# Patient Record
Sex: Male | Born: 1935 | Race: White | Hispanic: No | Marital: Married | Smoking: Former smoker
Health system: Southern US, Community
[De-identification: ages and names within clinical notes are randomized; demographics above are authoritative.]

## PROBLEM LIST (undated history)

## (undated) DIAGNOSIS — I5032 Chronic diastolic (congestive) heart failure: Secondary | ICD-10-CM

## (undated) DIAGNOSIS — E7800 Pure hypercholesterolemia, unspecified: Secondary | ICD-10-CM

## (undated) DIAGNOSIS — I509 Heart failure, unspecified: Secondary | ICD-10-CM

## (undated) DIAGNOSIS — I499 Cardiac arrhythmia, unspecified: Secondary | ICD-10-CM

## (undated) DIAGNOSIS — J449 Chronic obstructive pulmonary disease, unspecified: Secondary | ICD-10-CM

## (undated) DIAGNOSIS — N183 Chronic kidney disease, stage 3 unspecified: Secondary | ICD-10-CM

## (undated) DIAGNOSIS — I714 Abdominal aortic aneurysm, without rupture, unspecified: Secondary | ICD-10-CM

## (undated) DIAGNOSIS — N189 Chronic kidney disease, unspecified: Secondary | ICD-10-CM

## (undated) DIAGNOSIS — M2550 Pain in unspecified joint: Secondary | ICD-10-CM

## (undated) DIAGNOSIS — I493 Ventricular premature depolarization: Secondary | ICD-10-CM

## (undated) DIAGNOSIS — G4730 Sleep apnea, unspecified: Secondary | ICD-10-CM

## (undated) DIAGNOSIS — I700 Atherosclerosis of aorta: Secondary | ICD-10-CM

## (undated) DIAGNOSIS — I10 Essential (primary) hypertension: Secondary | ICD-10-CM

## (undated) DIAGNOSIS — B029 Zoster without complications: Secondary | ICD-10-CM

## (undated) DIAGNOSIS — I4891 Unspecified atrial fibrillation: Secondary | ICD-10-CM

## (undated) DIAGNOSIS — I2510 Atherosclerotic heart disease of native coronary artery without angina pectoris: Secondary | ICD-10-CM

## (undated) HISTORY — DX: Abdominal aortic aneurysm, without rupture: I71.4

## (undated) HISTORY — PX: URETHROTOMY: SHX1083

## (undated) HISTORY — DX: Ventricular premature depolarization: I49.3

## (undated) HISTORY — DX: Chronic kidney disease, stage 3 (moderate): N18.3

## (undated) HISTORY — DX: Sleep apnea, unspecified: G47.30

## (undated) HISTORY — PX: TONSILLECTOMY: SUR1361

## (undated) HISTORY — DX: Chronic kidney disease, stage 3 unspecified: N18.30

## (undated) HISTORY — DX: Cardiac arrhythmia, unspecified: I49.9

## (undated) HISTORY — DX: Abdominal aortic aneurysm, without rupture, unspecified: I71.40

## (undated) HISTORY — DX: Pure hypercholesterolemia, unspecified: E78.00

## (undated) HISTORY — DX: Unspecified atrial fibrillation: I48.91

## (undated) HISTORY — DX: Heart failure, unspecified: I50.9

## (undated) HISTORY — DX: Atherosclerotic heart disease of native coronary artery without angina pectoris: I25.10

## (undated) HISTORY — DX: Chronic obstructive pulmonary disease, unspecified: J44.9

## (undated) HISTORY — DX: Essential (primary) hypertension: I10

## (undated) HISTORY — DX: Zoster without complications: B02.9

## (undated) HISTORY — DX: Chronic diastolic (congestive) heart failure: I50.32

## (undated) HISTORY — DX: Pain in unspecified joint: M25.50

## (undated) HISTORY — DX: Chronic kidney disease, unspecified: N18.9

---

## 1998-06-27 ENCOUNTER — Ambulatory Visit (HOSPITAL_COMMUNITY): Admission: RE | Admit: 1998-06-27 | Discharge: 1998-06-27 | Payer: Self-pay | Admitting: Gastroenterology

## 2002-12-04 ENCOUNTER — Ambulatory Visit (HOSPITAL_COMMUNITY): Admission: RE | Admit: 2002-12-04 | Discharge: 2002-12-04 | Payer: Self-pay | Admitting: Gastroenterology

## 2004-05-04 ENCOUNTER — Encounter: Admission: RE | Admit: 2004-05-04 | Discharge: 2004-05-04 | Payer: Self-pay | Admitting: Geriatric Medicine

## 2005-05-15 ENCOUNTER — Ambulatory Visit (HOSPITAL_COMMUNITY): Admission: RE | Admit: 2005-05-15 | Discharge: 2005-05-15 | Payer: Self-pay | Admitting: Urology

## 2005-05-15 ENCOUNTER — Ambulatory Visit (HOSPITAL_BASED_OUTPATIENT_CLINIC_OR_DEPARTMENT_OTHER): Admission: RE | Admit: 2005-05-15 | Discharge: 2005-05-15 | Payer: Self-pay | Admitting: Urology

## 2005-09-30 HISTORY — PX: PENILE PROSTHESIS IMPLANT: SHX240

## 2005-10-01 ENCOUNTER — Ambulatory Visit (HOSPITAL_COMMUNITY): Admission: RE | Admit: 2005-10-01 | Discharge: 2005-10-01 | Payer: Self-pay | Admitting: Urology

## 2005-10-05 ENCOUNTER — Inpatient Hospital Stay (HOSPITAL_COMMUNITY): Admission: EM | Admit: 2005-10-05 | Discharge: 2005-10-07 | Payer: Self-pay | Admitting: Urology

## 2005-10-12 ENCOUNTER — Encounter: Admission: RE | Admit: 2005-10-12 | Discharge: 2005-10-12 | Payer: Self-pay | Admitting: Geriatric Medicine

## 2006-09-05 ENCOUNTER — Encounter: Admission: RE | Admit: 2006-09-05 | Discharge: 2006-09-05 | Payer: Self-pay | Admitting: Geriatric Medicine

## 2006-09-23 ENCOUNTER — Encounter: Admission: RE | Admit: 2006-09-23 | Discharge: 2006-09-23 | Payer: Self-pay | Admitting: Vascular Surgery

## 2007-03-31 ENCOUNTER — Ambulatory Visit: Payer: Self-pay | Admitting: Vascular Surgery

## 2007-05-29 ENCOUNTER — Encounter: Admission: RE | Admit: 2007-05-29 | Discharge: 2007-05-29 | Payer: Self-pay | Admitting: Geriatric Medicine

## 2007-06-03 ENCOUNTER — Encounter: Admission: RE | Admit: 2007-06-03 | Discharge: 2007-06-03 | Payer: Self-pay | Admitting: Geriatric Medicine

## 2008-03-19 ENCOUNTER — Ambulatory Visit: Payer: Self-pay | Admitting: Vascular Surgery

## 2008-08-13 ENCOUNTER — Emergency Department (HOSPITAL_COMMUNITY): Admission: EM | Admit: 2008-08-13 | Discharge: 2008-08-13 | Payer: Self-pay | Admitting: Emergency Medicine

## 2009-03-25 ENCOUNTER — Ambulatory Visit: Payer: Self-pay | Admitting: Vascular Surgery

## 2009-06-02 ENCOUNTER — Emergency Department (HOSPITAL_COMMUNITY): Admission: EM | Admit: 2009-06-02 | Discharge: 2009-06-02 | Payer: Self-pay | Admitting: Emergency Medicine

## 2009-09-15 ENCOUNTER — Ambulatory Visit: Payer: Self-pay | Admitting: Vascular Surgery

## 2009-10-06 ENCOUNTER — Inpatient Hospital Stay (HOSPITAL_COMMUNITY): Admission: EM | Admit: 2009-10-06 | Discharge: 2009-10-10 | Payer: Self-pay | Admitting: Emergency Medicine

## 2009-10-07 ENCOUNTER — Encounter (INDEPENDENT_AMBULATORY_CARE_PROVIDER_SITE_OTHER): Payer: Self-pay | Admitting: Internal Medicine

## 2009-11-02 ENCOUNTER — Ambulatory Visit (HOSPITAL_COMMUNITY): Admission: RE | Admit: 2009-11-02 | Discharge: 2009-11-02 | Payer: Self-pay | Admitting: Geriatric Medicine

## 2010-03-03 ENCOUNTER — Ambulatory Visit: Payer: Self-pay | Admitting: Vascular Surgery

## 2010-11-16 ENCOUNTER — Ambulatory Visit: Payer: Self-pay | Admitting: Vascular Surgery

## 2011-04-05 LAB — BASIC METABOLIC PANEL
BUN: 17 mg/dL (ref 6–23)
BUN: 42 mg/dL — ABNORMAL HIGH (ref 6–23)
CO2: 24 mEq/L (ref 19–32)
CO2: 25 mEq/L (ref 19–32)
Chloride: 100 mEq/L (ref 96–112)
Chloride: 103 mEq/L (ref 96–112)
Chloride: 105 mEq/L (ref 96–112)
Chloride: 108 mEq/L (ref 96–112)
Creatinine, Ser: 1.23 mg/dL (ref 0.4–1.5)
GFR calc Af Amer: >60 mL/min (ref >60)
GFR calc non Af Amer: >60 mL/min (ref >60)
Glucose, Bld: 132 mg/dL — ABNORMAL HIGH (ref 70–99)
Glucose, Bld: 142 mg/dL — ABNORMAL HIGH (ref 70–99)
Glucose, Bld: 155 mg/dL — ABNORMAL HIGH (ref 70–99)
Potassium: 3.8 mEq/L (ref 3.5–5.1)
Potassium: 4.1 mEq/L (ref 3.5–5.1)
Sodium: 138 mEq/L (ref 135–145)
Sodium: 140 mEq/L (ref 135–145)
Sodium: 141 mEq/L (ref 135–145)

## 2011-04-05 LAB — CBC
HCT: 36.5 % — ABNORMAL LOW (ref 39.0–52.0)
HCT: 39.9 % (ref 39.0–52.0)
Hemoglobin: 12.4 g/dL — ABNORMAL LOW (ref 13.0–17.0)
Hemoglobin: 13.6 g/dL (ref 13.0–17.0)
MCHC: 33.5 g/dL (ref 30.0–36.0)
MCHC: 33.9 g/dL (ref 30.0–36.0)
MCHC: 34 g/dL (ref 30.0–36.0)
MCV: 95.5 fL (ref 78.0–100.0)
MCV: 95.9 fL (ref 78.0–100.0)
MCV: 96.7 fL (ref 78.0–100.0)
Platelets: 155 10*3/uL (ref 150–400)
Platelets: 231 10*3/uL (ref 150–400)
RBC: 3.78 MIL/uL — ABNORMAL LOW (ref 4.22–5.81)
RDW: 13.1 % (ref 11.5–15.5)
RDW: 14.1 % (ref 11.5–15.5)
WBC: 11 10*3/uL — ABNORMAL HIGH (ref 4.0–10.5)
WBC: 9.1 10*3/uL (ref 4.0–10.5)

## 2011-04-05 LAB — URINE CULTURE: Colony Count: >=100000

## 2011-04-05 LAB — CARDIAC PANEL(CRET KIN+CKTOT+MB+TROPI)
CK, MB: 1.9 ng/mL (ref 0.3–4.0)
CK, MB: 2.1 ng/mL (ref 0.3–4.0)
Relative Index: INVALID (ref 0.0–2.5)
Relative Index: INVALID (ref 0.0–2.5)
Total CK: 48 U/L (ref 7–232)
Troponin I: 0.01 ng/mL (ref 0.00–0.06)
Troponin I: 0.01 ng/mL (ref 0.00–0.06)
Troponin I: 0.02 ng/mL (ref 0.00–0.06)

## 2011-04-05 LAB — URINALYSIS, ROUTINE W REFLEX MICROSCOPIC
Bilirubin Urine: NEGATIVE
Glucose, UA: NEGATIVE mg/dL
Nitrite: NEGATIVE
Protein, ur: 100 mg/dL — AB
Specific Gravity, Urine: 1.014 (ref 1.005–1.030)
Urobilinogen, UA: 0.2 mg/dL (ref 0.0–1.0)
pH: 5.5 (ref 5.0–8.0)

## 2011-04-05 LAB — DIFFERENTIAL
Basophils Absolute: 0 10*3/uL (ref 0.0–0.1)
Basophils Relative: 0 % (ref 0–1)
Eosinophils Absolute: 0 10*3/uL (ref 0.0–0.7)
Eosinophils Relative: 0 % (ref 0–5)
Lymphocytes Relative: 13 % (ref 12–46)
Lymphs Abs: 1.5 10*3/uL (ref 0.7–4.0)
Monocytes Absolute: 0.9 10*3/uL (ref 0.1–1.0)
Monocytes Relative: 8 % (ref 3–12)
Neutro Abs: 8.6 10*3/uL — ABNORMAL HIGH (ref 1.7–7.7)
Neutrophils Relative %: 78 % — ABNORMAL HIGH (ref 43–77)

## 2011-04-05 LAB — HEMOGLOBIN A1C
Hgb A1c MFr Bld: 6.7 % — ABNORMAL HIGH (ref 4.6–6.1)
Mean Plasma Glucose: 146 mg/dL

## 2011-04-05 LAB — LIPID PANEL
HDL: 35 mg/dL — ABNORMAL LOW (ref >39)
Total CHOL/HDL Ratio: 2.9 RATIO
Triglycerides: 114 mg/dL (ref <150)

## 2011-04-05 LAB — COMPREHENSIVE METABOLIC PANEL
ALT: 19 U/L (ref 0–53)
AST: 22 U/L (ref 0–37)
Albumin: 3.2 g/dL — ABNORMAL LOW (ref 3.5–5.2)
Alkaline Phosphatase: 68 U/L (ref 39–117)
BUN: 13 mg/dL (ref 6–23)
CO2: 18 mEq/L — ABNORMAL LOW (ref 19–32)
Calcium: 8.8 mg/dL (ref 8.4–10.5)
Chloride: 108 mEq/L (ref 96–112)
Creatinine, Ser: 0.97 mg/dL (ref 0.4–1.5)
GFR calc Af Amer: >60 mL/min (ref >60)
GFR calc non Af Amer: >60 mL/min (ref >60)
Glucose, Bld: 230 mg/dL — ABNORMAL HIGH (ref 70–99)
Potassium: 4 mEq/L (ref 3.5–5.1)
Sodium: 137 mEq/L (ref 135–145)
Total Bilirubin: 1.1 mg/dL (ref 0.3–1.2)
Total Protein: 6.8 g/dL (ref 6.0–8.3)

## 2011-04-05 LAB — GLUCOSE, CAPILLARY
Glucose-Capillary: 117 mg/dL — ABNORMAL HIGH (ref 70–99)
Glucose-Capillary: 125 mg/dL — ABNORMAL HIGH (ref 70–99)
Glucose-Capillary: 127 mg/dL — ABNORMAL HIGH (ref 70–99)
Glucose-Capillary: 137 mg/dL — ABNORMAL HIGH (ref 70–99)
Glucose-Capillary: 141 mg/dL — ABNORMAL HIGH (ref 70–99)
Glucose-Capillary: 150 mg/dL — ABNORMAL HIGH (ref 70–99)
Glucose-Capillary: 283 mg/dL — ABNORMAL HIGH (ref 70–99)
Glucose-Capillary: 289 mg/dL — ABNORMAL HIGH (ref 70–99)
Glucose-Capillary: 310 mg/dL — ABNORMAL HIGH (ref 70–99)
Glucose-Capillary: 96 mg/dL (ref 70–99)

## 2011-04-05 LAB — POCT CARDIAC MARKERS
CKMB, poc: 1.3 ng/mL (ref 1.0–8.0)
Myoglobin, poc: 107 ng/mL (ref 12–200)
Troponin i, poc: <0.05 ng/mL (ref 0.00–0.09)

## 2011-04-05 LAB — D-DIMER, QUANTITATIVE: D-Dimer, Quant: 2.91 ug/mL-FEU — ABNORMAL HIGH (ref 0.00–0.48)

## 2011-04-05 LAB — URINE MICROSCOPIC-ADD ON

## 2011-04-05 LAB — BRAIN NATRIURETIC PEPTIDE
Pro B Natriuretic peptide (BNP): 31.5 pg/mL (ref 0.0–100.0)
Pro B Natriuretic peptide (BNP): 991 pg/mL — ABNORMAL HIGH (ref 0.0–100.0)

## 2011-04-05 LAB — TSH: TSH: 1.286 u[IU]/mL (ref 0.350–4.500)

## 2011-04-09 LAB — CBC
HCT: 41.2 % (ref 39.0–52.0)
MCV: 98.3 fL (ref 78.0–100.0)
Platelets: 211 10*3/uL (ref 150–400)
RDW: 13.5 % (ref 11.5–15.5)

## 2011-04-09 LAB — POCT I-STAT, CHEM 8
BUN: 21 mg/dL (ref 6–23)
Calcium, Ion: 1.25 mmol/L (ref 1.12–1.32)
Creatinine, Ser: 1.1 mg/dL (ref 0.4–1.5)
TCO2: 17 mmol/L (ref 0–100)

## 2011-04-09 LAB — DIFFERENTIAL
Basophils Absolute: 0 10*3/uL (ref 0.0–0.1)
Basophils Relative: 1 % (ref 0–1)
Eosinophils Absolute: 0.1 10*3/uL (ref 0.0–0.7)
Eosinophils Relative: 2 % (ref 0–5)

## 2011-04-09 LAB — GLUCOSE, CAPILLARY: Glucose-Capillary: 141 mg/dL — ABNORMAL HIGH (ref 70–99)

## 2011-04-09 LAB — POCT CARDIAC MARKERS

## 2011-05-15 NOTE — Procedures (Signed)
DUPLEX ULTRASOUND OF ABDOMINAL AORTA   INDICATION:  Follow up abdominal aortic aneurysm.   HISTORY:  Diabetes:  Yes.  Cardiac:  No.  Hypertension:  Yes.  Smoking:  Quit.  Connective Tissue Disorder:  Family History:  Father possibly had AAA, per patient.  Previous Surgery:  No.   DUPLEX EXAM:         AP (cm)                   TRANSVERSE (cm)  Proximal             2.61 cm                   2.74 cm  Mid                  4.41 cm                   4.51 cm  Distal               2.74 cm                   2.55 cm  Right Iliac          1.03 cm                   1.09 cm  Left Iliac           1.09 cm                   1.06 cm   PREVIOUS:  Date: 03/19/08  AP:  3.94  TRANSVERSE:  4.42   IMPRESSION:  Stable abdominal aortic aneurysm with largest measurement  of 4.41 cm X 4.51 cm.   ___________________________________________  Larina Earthly, M.D.   AS/MEDQ  D:  03/25/2009  T:  03/25/2009  Job:  [id number]

## 2011-05-15 NOTE — Procedures (Signed)
DUPLEX ULTRASOUND OF ABDOMINAL AORTA   INDICATION:  Follow up abdominal aortic aneurysm.   HISTORY:  Diabetes:  Yes.  Cardiac:  CHF.  Hypertension:  Yes.  Smoking:  Previous.  Connective Tissue Disorder:  Family History:  Father had abdominal aortic aneurysm.  Previous Surgery:  No.   DUPLEX EXAM:         AP (cm)                   TRANSVERSE (cm)  Proximal             2.48 cm                   2.25 cm  Mid                  3.49 cm                   4.3 cm  Distal               2.32 cm                   2.11 cm  Right Iliac          0.84 cm                   0.96 cm  Left Iliac           0.96 cm                   0.97 cm   PREVIOUS:  Date:  AP:  3.59  TRANSVERSE:  4.3   IMPRESSION:  1. Stable mid abdominal aortic aneurysm measuring 3.49 cm x 4.3 cm.  2. Patent aorta without elevated velocities observed.   ___________________________________________    Larina Earthly, M.D.  LT/MEDQ  D:  11/16/2010  T:  11/16/2010  Job:  [id number]

## 2011-05-15 NOTE — Procedures (Signed)
DUPLEX ULTRASOUND OF ABDOMINAL AORTA   INDICATION:  Followup abdominal aortic aneurysm.   HISTORY:  Diabetes:  Yes.  Cardiac:  CHF.  Hypertension:  Yes.  Smoking:  Previous.  Connective Tissue Disorder:  Family History:  Father had abdominal aortic aneurysm.  Previous Surgery:  No.   DUPLEX EXAM:         AP (cm)                   TRANSVERSE (cm)  Proximal             1.1 cm                    1.7 cm  Mid                  3.59 cm                   4.3 cm  Distal               3.04 cm                   3.13 cm  Right Iliac          1.36 cm                   1.36 cm  Left Iliac           1.2 cm                    1.22 cm   PREVIOUS:  Date:  09/15/2009  AP:  4.01  TRANSVERSE:  4.3   IMPRESSION:  Stable abdominal aortic aneurysm measuring 3.59 cm x 4.3  cm.   ___________________________________________  Larina Earthly, M.D.   AS/MEDQ  D:  03/03/2010  T:  03/03/2010  Job:  [id number]

## 2011-05-15 NOTE — Procedures (Signed)
DUPLEX ULTRASOUND OF ABDOMINAL AORTA   INDICATION:  Follow up abdominal aortic aneurysm.   HISTORY:  Diabetes:  Yes.  Cardiac:  No.  Hypertension:  Yes.  Smoking:  Quit.  Connective Tissue Disorder:  Family History:  Father possibly had AAA per patient.  Previous Surgery:  No.   DUPLEX EXAM:         AP (cm)                   TRANSVERSE (cm)  Proximal             2.78 cm                   2.70 cm  Mid                  3.94 cm                   4.42 cm  Distal               2.82 cm                   3.02 cm  Right Iliac          1.10 cm                   1.16 cm  Left Iliac           1.01 cm                   1.13 cm   PREVIOUS:  Date: 03/31/2007  AP:  3.5  TRANSVERSE:  4.5   IMPRESSION:  Fairly stable abdominal aortic aneurysm measurements with  the largest 3.94 x 4.42 cm.   ___________________________________________  Larina Earthly, M.D.   AS/MEDQ  D:  03/19/2008  T:  03/19/2008  Job:  [id number]

## 2011-05-15 NOTE — Procedures (Signed)
DUPLEX ULTRASOUND OF ABDOMINAL AORTA   INDICATION:  Follow-up abdominal aortic aneurysm.   HISTORY:  Diabetes:  Yes.  Cardiac:  No.  Hypertension:  Yes.  Smoking:  Quit.  Connective Tissue Disorder:  Family History:  Father possibly had AAA.  Previous Surgery:  No.   DUPLEX EXAM:         AP (cm)                   TRANSVERSE (cm)  Proximal             2.68 cm                   2.42 cm  Mid                  4.01 cm                   4.30 cm  Distal               1.89 cm                   1.82 cm  Right Iliac          1.32 cm                   1.35 cm  Left Iliac           1.12 cm                   1.09 cm   PREVIOUS:  Date: 03/25/2009  AP:  4.41  TRANSVERSE:  4.51   IMPRESSION:  Fairly stable abdominal aortic aneurysm with largest  measurement of 4.01 cm X 4.30 cm.   ___________________________________________  Larina Earthly, M.D.   AS/MEDQ  D:  09/15/2009  T:  09/15/2009  Job:  [id number]

## 2011-05-18 NOTE — Op Note (Signed)
[name], Alan Ford                   ACCOUNT NO.:  [id number]   MEDICAL RECORD NO.:  [id number]          PATIENT TYPE:  AMB   LOCATION:  [facility]                         FACILITY:  [facility]   PHYSICIAN:  Bertram Millard. Dahlstedt, M.D.DATE OF BIRTH:  1930-05-07   DATE OF PROCEDURE:  05/15/2005  DATE OF DISCHARGE:                                 OPERATIVE REPORT   PREOPERATIVE DIAGNOSIS:  Urethral stricture.   POSTOPERATIVE DIAGNOSIS:  Urethral stricture   PROCEDURE:  Visual internal urethrotomy.   SURGEON:  Dr. Retta Diones.   ANESTHESIA:  General with LMA.   COMPLICATIONS:  None.   BRIEF HISTORY:  This elderly male recently presented to my office for  evaluation of erectile dysfunction. He will eventually be scheduled for an  inflatable penile prosthesis.   He was found to have a recurrent urethral stricture - he has significant  urinary symptoms with an IPSS score of 22. He presents at this time for  visual internal urethrotomy.   DESCRIPTION OF PROCEDURE:  The patient was administered preoperative IV  antibiotic and taken to the operating room where general anesthetic was  administered. He was placed in the dorsal lithotomy position. Genitalia and  perineum were prepped and draped. A 22-French cystoscope was advanced to a  dense bulbous urethral stricture. I first placed a 365 micron laser fiber,  and with the laser to power 0.8 joules, I started to treat this stricture.  It was quite dense and was not progressing very well. At this point, I just  took out the urethrotome and incised the stricture at the 12 o'clock and the  9 o'clock positions. It was quite tense and took a significant amount of  incision; however, I incised it enough that I was able to admit a 28-French  [employer] sound. At this point, the procedure was terminated, and a 20-  [place] Foley catheter was placed.   The patient tolerated procedure well. He was awakened and taken to PACU in  stable condition.   He was  discharged home with his Foley catheter be removed in the morning. He  was also discharged on Cipro 250 mg daily for 5 days and Vicodin #15 one  p.o. q.4h. p.r.n. pain. He will follow-up in 1 week.      SMD/MEDQ  D:  05/15/2005  T:  05/15/2005  Job:  [id number]   cc:   Hal T. Stoneking, M.D.  [address] [place], [place] [address]  Fax: [phone]

## 2011-05-18 NOTE — Op Note (Signed)
NAMESENAY, SISTRUNK                   ACCOUNT NO.:  [id number]   MEDICAL RECORD NO.:  [id number]          PATIENT TYPE:  INP   LOCATION:  NA                           FACILITY:  [facility]   PHYSICIAN:  Bertram Millard. Dahlstedt, M.D.DATE OF BIRTH:  Dec 26, 1930   DATE OF PROCEDURE:  10/05/2005  DATE OF DISCHARGE:                                 OPERATIVE REPORT   PREOPERATIVE DIAGNOSIS:  Erectile dysfunction.   POSTOPERATIVE DIAGNOSIS:  Erectile dysfunction.   PROCEDURE:  Placement of inflatable penile prosthesis.   SURGEON:  Bertram Millard. Retta Diones, M.D.   ASSISTANT:  Glade Nurse, M.D.   ANESTHESIA:  General endotracheal.   SPECIMENS:  None.   HISTORY OF PRESENT ILLNESS:  This is a 77 year old gentleman with a history  of urethral stricture. He was treated with transurethral laser treatment. He  has been voiding well but has done some intermittent catheterization to keep  his stricture from reforming. Recently he has been unable to obtain and  maintain retraction erections associated with some curvature of erections to  approximately 20 degrees to the left. He has failed PD5 inhibitors and due  to his Peyronie's  disease, he has continued erectile dysfunction. He has  agreed to proceed with penile prosthesis.   DESCRIPTION OF PROCEDURE:  The patient was identified by wrist bracelet and  brought to room 10 where he received preoperative antibiotics and was  prepped and draped in the usual sterile fashion taking care to avoid  peripheral neuropathy or compartment syndrome. A 20-French Foley catheter  was placed to the level of the urinary bladder, clear urine output was  obtained. The Foley catheter was capped.  Next, we made a 5 cm infrapubic  incision along the midline, we dissected down to the anterior sheath which  we identified at the superior portion of the incision. We made a 2 cm  vertical incision in the fascia with Bovie cautery. We developed the space  of Retzius bluntly. We  then placed the reservoir in the space of Retzius. We  then closed the fascia with a running 2-0 Vicryl being careful not to  involve the tubing or the reservoir in the closure. We then inflated the  reservoir with 65 mL of sterile water and no back pressure was observed. We  then shunted the tubing. We next turned our attention to the inferior  portion of the incision. We bluntly dissected out and identified the  corporal bodies. Stay sutures were placed in the proximal portion of the  corporal body, medial and lateral on each side using interrupted 2-0  Vicryl's. We then turned our attention to the left corporal body. We made a  corporotomy with Bovie cautery in between our stay sutures. We then  developed a plane for dissection both proximally and distally with  Metzenbaum scissors. Next we dilated proximally and distally sequentially  from 8 to 38 [place] with Mentor dilators. This went easily. We were careful  not to cross through the septum distally and the posterior dilation went  without difficulty and the dilator seated nicely onto the bone. Once  dilated  to 13, we dilated to 14-French with the Hagar dilator distally. Next we  turned our attention to the right corporal body and this process was  repeated first making a corporotomy, developing a dissection plane with  Metzenbaum scissors and then dilating 8 to a 42 [place] with Mentor dilators  both proximally and distally taking care not to crossover distally and to  make sure the dilator seated on the bone proximally. We then repeated the  dilation up to 14-French with the Hagar dilator distally. Next we developed  a tunnel from the incision to the right hemiscrotum and created a pouch in  the dependent portion of the scrotum between the testes careful to avoid  bleeding. Next we measured both corporal bodies both by placing the  measuring device proximally and then placing the penis in stretch and  measuring approximately to the  corona. Both corporal bodies measured 21 cm  in length. Next an AMS three-piece 700 CX 18 cm pump was brought onto the  field. Distal strain delivery devices were unraveled and inserted with the  left corporal body. We placed the distal strand through the delivery needle  and placed it into the delivery trocar which we placed on our aforementioned  dilated corpora distally. The delivery needle was brought through the glans  penis and the string was brought through. The corporal inflation device was  ligated distally without difficulty on the left. We placed a 3 cm rear tip  extender proximally and placed the proximal portion of the cylinder without  difficulty. Next this process was repeated in the right corporal body. Next  we inflated the penile prosthesis with approximately 50 mL of sterile water.  Excellent fit was noted distally. There was approximately 5-10 degrees of  left curvature. The penis was then modeled. We heard an audible pop and  noted that the curvature had resolved. Next the corporal devices were  deflated and the pump was placed through the tunnel in the dependent portion  of the scrotum. Next we closed our corporotomies by bringing it together and  tying our previously mentioned 2-0 Vicryl stay sutures. Each corporotomy  required an additional interrupted 2-0 Vicryl for adequate closure. Next, we  reinflated the penile prosthesis using the pump. It was again admitted that  it had an excellent fit and contour. The pump was deflated leaving  approximately 10 mL in the cylinder. Next the tubing devices were trimmed  and they were connected using the connector device being careful to make  sure no air was in the tubing prior to our connection. Once connected, we  retested the penile prosthesis and found contour to be excellent. It was  again deflated leaving approximately 10 mL in the cylinder. Finally we began our skin closure. We closed the skin in two layers closing  Scarpa's fascia  with running 2-0 Vicryl and closing the skin with a running 4-0 subcuticular  Monocryl. Steri-Strips and dressing were applied. The delivery strings were  removed from their exit site at the glans penis. Scrotal support was  applied. The patient was reversed from his anesthesia which he tolerated  well without complication. Please note Bertram Millard. Dahlstedt, M.D. was  present and participated in all aspects of this case.     ______________________________  Glade Nurse, MD      Bertram Millard. Dahlstedt, M.D.  Electronically Signed    MT/MEDQ  D:  10/05/2005  T:  10/05/2005  Job:  [id number]

## 2011-05-18 NOTE — Op Note (Signed)
   NAMEPEREGRINE, Alan Ford                               ACCOUNT NO.:  [id number]   MEDICAL RECORD NO.:  [id number]                   PATIENT TYPE:  AMB   LOCATION:  ENDO                                 FACILITY:  [facility]   PHYSICIAN:  Petra Kuba, M.D.                 DATE OF BIRTH:  [birth year]   DATE OF PROCEDURE:  DATE OF DISCHARGE:                                 OPERATIVE REPORT   PROCEDURE:  Colonoscopy.   INDICATIONS:  Patient with history of colon polyps, due for repeat  screening.  Consent was signed after risks, benefits, methods and options  were thoroughly discussed in the past.   MEDICINES USED:  Demerol 50, Versed 5.   DESCRIPTION OF PROCEDURE:  Rectal inspection was positive for external  hemorrhoids, small.  Digital exam was negative.  Video colonoscope was  inserted, easily advanced from the colon to the cecum, this did not require  any abdominal pressure or any position changes.  No obvious abnormality was  seen on insertion.  The cecum was identified by the appendiceal orifice and  the ileocecal valve.  Prep was adequate, did require some washing and  suctioning for adequate visualization.  The scope was slowly withdrawn.  On  slow withdrawal through the colon, no polyps, tumors, masses or even  diverticula were seen and this was slowly withdrawn back to the rectum.  Once back in the rectum, the scope was retroflexed, pertinent for some  internal hemorrhoids.  The scope was  then re-advanced up the left side of  the colon.  Air was suctioned, scope removed.  The patient tolerated the  procedure well.  There was no obvious immediate complication.   ENDOSCOPIC DIAGNOSES:  1. Internal and external hemorrhoids.  2. Otherwise within normal limits to the cecum.   PLAN:  Have her to see back p.r.n.; otherwise, return to Dr. Clovis Riley for  the customary health care maintenance including yearly rectals and guaiacs,  and would re-check in five years if doing well  medically.                                               Petra Kuba, M.D.    MEM/MEDQ  D:  12/04/2002  T:  12/05/2002  Job:  [id number]

## 2011-07-24 ENCOUNTER — Encounter: Payer: Self-pay | Admitting: Podiatry

## 2011-07-24 DIAGNOSIS — I10 Essential (primary) hypertension: Secondary | ICD-10-CM | POA: Insufficient documentation

## 2011-07-24 DIAGNOSIS — I5032 Chronic diastolic (congestive) heart failure: Secondary | ICD-10-CM | POA: Insufficient documentation

## 2011-07-24 DIAGNOSIS — E119 Type 2 diabetes mellitus without complications: Secondary | ICD-10-CM | POA: Insufficient documentation

## 2011-07-24 DIAGNOSIS — I509 Heart failure, unspecified: Secondary | ICD-10-CM

## 2011-07-24 DIAGNOSIS — E7800 Pure hypercholesterolemia, unspecified: Secondary | ICD-10-CM | POA: Insufficient documentation

## 2011-07-24 HISTORY — DX: Chronic diastolic (congestive) heart failure: I50.32

## 2011-11-13 ENCOUNTER — Encounter: Payer: Self-pay | Admitting: Vascular Surgery

## 2011-11-20 ENCOUNTER — Ambulatory Visit: Payer: Self-pay | Admitting: Vascular Surgery

## 2011-12-03 ENCOUNTER — Encounter: Payer: Self-pay | Admitting: Vascular Surgery

## 2011-12-04 ENCOUNTER — Ambulatory Visit (INDEPENDENT_AMBULATORY_CARE_PROVIDER_SITE_OTHER): Payer: Medicare Other | Admitting: *Deleted

## 2011-12-04 ENCOUNTER — Ambulatory Visit (INDEPENDENT_AMBULATORY_CARE_PROVIDER_SITE_OTHER): Payer: Medicare Other | Admitting: Vascular Surgery

## 2011-12-04 ENCOUNTER — Encounter: Payer: Self-pay | Admitting: Vascular Surgery

## 2011-12-04 VITALS — BP 152/81 | HR 96 | Resp 16 | Ht 70.0 in | Wt 198.0 lb

## 2011-12-04 DIAGNOSIS — I714 Abdominal aortic aneurysm, without rupture: Secondary | ICD-10-CM

## 2011-12-04 NOTE — Progress Notes (Signed)
The patient is a for ultrasound followup of his abdominal aortic aneurysm. This is been followed in our office since 2007 he had a CT scan at that time documenting a 4.3 cm aneurysm. He has had serial ultrasound since that time has had a stable aneurysm. He has no symptoms referable to his aneurysm. He is here today for yearly followup. Family history is significant for an annular abdominal aortic aneurysm in his father.  Past Medical History  Diagnosis Date  . Diabetes mellitus   . High blood pressure   . High cholesterol   . CHF (congestive heart failure)   . Emphysema   . Joint pain   . AAA (abdominal aortic aneurysm)   . COPD (chronic obstructive pulmonary disease)   . Chronic kidney disease     History  Substance Use Topics  . Smoking status: Former Smoker    Types: Cigarettes    Quit date: 12/31/1985  . Smokeless tobacco: Not on file  . Alcohol Use: No     quit in 1985    Family History  Problem Relation Age of Onset  . Other Father     AAA  . Heart disease Father   . Hypertension Father   . Diabetes Mother   . Heart disease Mother   . Hypertension Mother   . Diabetes Sister   . Heart disease Sister   . Hypertension Sister   . Diabetes Brother   . Heart disease Brother   . Hypertension Brother     No Known Allergies  Current outpatient prescriptions:AMLODIPINE BESYLATE PO, Take by mouth.  , Disp: , Rfl: ;  amLODipine-benazepril (LOTREL) 10-20 MG per capsule, Take 1 capsule by mouth daily.  , Disp: , Rfl: ;  Ascorbic Acid (C-1000/ROSE HIPS PO), Take 1 tablet by mouth daily.  , Disp: , Rfl: ;  aspirin EC 81 MG tablet, Take 81 mg by mouth daily.  , Disp: , Rfl: ;  B Complex-Folic Acid (BALANCED B-150 PO), Take by mouth.  , Disp: , Rfl:  Bilberry, Vaccinium myrtillus, 100 MG CAPS, Take 100 mg by mouth daily.  , Disp: , Rfl: ;  Chromium Picolinate 800 MCG TABS, Take 2 tablets by mouth daily.  , Disp: , Rfl: ;  CINNAMON PO, Take 1,000 mg by mouth 2 (two) times daily.  ,  Disp: , Rfl: ;  Coenzyme Q10 (CO Q-10) 100 MG CAPS, Take 100 mg by mouth daily.  , Disp: , Rfl: ;  ezetimibe-simvastatin (VYTORIN) 10-40 MG per tablet, Take 1 tablet by mouth at bedtime.  , Disp: , Rfl:  Garlic 500 MG TABS, Take by mouth.  , Disp: , Rfl: ;  Ginsengs-Royal Jelly (GINSENG COMPLEX/ROYAL JELLY) 800-200 MG CAPS, Take by mouth.  , Disp: , Rfl: ;  GLIPIZIDE PO, Take by mouth.  , Disp: , Rfl: ;  Lecithin 1200 MG CAPS, Take by mouth daily.  , Disp: , Rfl: ;  METFORMIN HCL PO, Take by mouth.  , Disp: , Rfl: ;  Omega-3 Fatty Acids (SALMON OIL-1000 PO), Take 2 tablets by mouth 2 (two) times daily.  , Disp: , Rfl:  Ramipril (ALTACE PO), Take by mouth.  , Disp: , Rfl: ;  Saw Palmetto, Serenoa repens, 160 MG TABS, Take by mouth 2 (two) times daily.  , Disp: , Rfl: ;  St Johns Wort 300 MG CAPS, Take 300 mg by mouth daily as needed.  , Disp: , Rfl: ;  vitamin E 1000 UNIT capsule, Take 1,000  Units by mouth daily.  , Disp: , Rfl: ;  zinc gluconate 50 MG tablet, Take 50 mg by mouth daily.  , Disp: , Rfl:   BP 152/81  Pulse 96  Resp 16  Ht 5\' 10"  (1.778 m)  Wt 198 lb (89.812 kg)  BMI 28.41 kg/m2  SpO2 96%  Body mass index is 28.41 kg/(m^2).       Review of systems: Shortness of breath with exertion otherwise negative  Physical exam well-developed male appearing younger than stated age of 23. HEENT normal. Chest clear bilaterally. Radial femoral popliteal pulses all 2+ and equal. Abdominal exam reveals prominent aortic pulse with no tenderness do not palpate an aneurysm. Neurologic grossly intact.  Ultrasound of aorta: 4.3 cm aneurysm no change from prior study one year ago.  Impression and plan: Stable abdominal aortic aneurysm at 4.3 cm. He has had no change since 2007. I explained this is reassuring. I recommended continued serial ultrasound examinations are one year. I reviewed symptoms of aneurysm with him and present immediately to the emergency room should this occur otherwise he will see  [place] in one year

## 2012-01-23 NOTE — Procedures (Unsigned)
DUPLEX ULTRASOUND OF ABDOMINAL AORTA  INDICATION:  Follow up AAA.  HISTORY: Diabetes:  Yes. Cardiac:  CHF. Hypertension:  Yes. Smoking:  Previous. Connective Tissue Disorder: Family History:  Father had a AAA. Previous Surgery:  No.  DUPLEX EXAM:         AP (cm)                   TRANSVERSE (cm) Proximal             3.43 cm                   3.46 cm Mid                  3.84 cm                   4.30 cm Distal               2.62 cm                   2.42 cm Right Iliac          1.15 cm                   1.21 cm Left Iliac           1.15 cm                   1.18 cm  PREVIOUS:  Date: 11/16/2010  AP:  3.49  TRANSVERSE:  4.3  IMPRESSION: 1. Stable mid abdominal aortic aneurysm measuring 3.84 X 4.30 cm. 2. Essentially stable in comparison to the last examination.  ___________________________________________ Larina Earthly, M.D.  EM/MEDQ  D:  12/05/2011  T:  12/05/2011  Job:  [id number]

## 2012-12-05 ENCOUNTER — Other Ambulatory Visit: Payer: Self-pay | Admitting: *Deleted

## 2012-12-05 DIAGNOSIS — I714 Abdominal aortic aneurysm, without rupture: Secondary | ICD-10-CM

## 2012-12-08 ENCOUNTER — Encounter: Payer: Self-pay | Admitting: Neurosurgery

## 2012-12-09 ENCOUNTER — Encounter (INDEPENDENT_AMBULATORY_CARE_PROVIDER_SITE_OTHER): Payer: Medicare Other | Admitting: Vascular Surgery

## 2012-12-09 ENCOUNTER — Ambulatory Visit (INDEPENDENT_AMBULATORY_CARE_PROVIDER_SITE_OTHER): Payer: Medicare Other | Admitting: Neurosurgery

## 2012-12-09 ENCOUNTER — Encounter: Payer: Self-pay | Admitting: Neurosurgery

## 2012-12-09 VITALS — BP 132/78 | HR 69 | Resp 18 | Ht 71.0 in | Wt 196.0 lb

## 2012-12-09 DIAGNOSIS — I714 Abdominal aortic aneurysm, without rupture, unspecified: Secondary | ICD-10-CM | POA: Insufficient documentation

## 2012-12-09 NOTE — Progress Notes (Signed)
[facility] [place] AAA/Carotid Office Note  CC: AAA surveillance Referring Physician: Early  History of Present Illness: 78 year old male patient of Dr. Arbie Cookey with no history of AAA intervention. The patient denies any unusual abdominal or back pain. The patient denies any new medical diagnoses or recent surgery.  Past Medical History  Diagnosis Date  . Diabetes mellitus   . High blood pressure   . High cholesterol   . CHF (congestive heart failure)   . Emphysema   . Joint pain   . AAA (abdominal aortic aneurysm)   . COPD (chronic obstructive pulmonary disease)   . Chronic kidney disease     ROS: [x]  Positive   [ ]  Denies    General: [ ]  Weight loss, [ ]  Fever, [ ]  chills Neurologic: [ ]  Dizziness, [ ]  Blackouts, [ ]  Seizure [ ]  Stroke, [ ]  "Mini stroke", [ ]  Slurred speech, [ ]  Temporary blindness; [x ] weakness in arms or legs, [ ]  Hoarseness Cardiac: [ ]  Chest pain/pressure, [ ]  Shortness of breath at rest [x ] Shortness of breath with exertion, [ ]  Atrial fibrillation or irregular heartbeat Vascular: [ ]  Pain in legs with walking, [ ]  Pain in legs at rest, [ ]  Pain in legs at night,  [ ]  Non-healing ulcer, [ ]  Blood clot in vein/DVT,   Pulmonary: [ ]  Home oxygen, [ ]  Productive cough, [ ]  Coughing up blood, [ ]  Asthma,  [ x] Wheezing Musculoskeletal:  [ ]  Arthritis, [ ]  Low back pain, [ ]  Joint pain Hematologic: [ ]  Easy Bruising, [ ]  Anemia; [ ]  Hepatitis Gastrointestinal: [ ]  Blood in stool, [ ]  Gastroesophageal Reflux/heartburn, [ ]  Trouble swallowing Urinary: [ ]  chronic Kidney disease, [ ]  on HD - [ ]  MWF or [ ]  TTHS, [ ]  Burning with urination, [ ]  Difficulty urinating Skin: [ ]  Rashes, [ ]  Wounds Psychological: [ ]  Anxiety, [ ]  Depression   Social History History  Substance Use Topics  . Smoking status: Former Smoker    Types: Cigarettes    Quit date: 12/31/1985  . Smokeless tobacco: Not on file  . Alcohol Use: No     Comment: quit in  1985    Family History Family History  Problem Relation Age of Onset  . Other Father     AAA  . Heart disease Father   . Hypertension Father   . Diabetes Mother   . Heart disease Mother   . Hypertension Mother   . Diabetes Sister   . Heart disease Sister   . Hypertension Sister   . Diabetes Brother   . Heart disease Brother   . Hypertension Brother     No Known Allergies  Current Outpatient Prescriptions  Medication Sig Dispense Refill  . amLODipine-benazepril (LOTREL) 10-20 MG per capsule Take 1 capsule by mouth daily.        . Ascorbic Acid (C-1000/ROSE HIPS PO) Take 1 tablet by mouth daily.        Marland Kitchen aspirin EC 81 MG tablet Take 81 mg by mouth daily.        . B Complex-Folic Acid (BALANCED B-150 PO) Take by mouth.        Marland Kitchen BAYER CONTOUR TEST test strip       . Bilberry, Vaccinium myrtillus, 100 MG CAPS Take 100 mg by mouth daily.        . carvedilol (COREG) 6.25 MG tablet       . Chromium Picolinate  800 MCG TABS Take 2 tablets by mouth daily.        Marland Kitchen CINNAMON PO Take 1,000 mg by mouth 2 (two) times daily.        . Coenzyme Q10 (CO Q-10) 100 MG CAPS Take 100 mg by mouth daily.        Marland Kitchen ezetimibe-simvastatin (VYTORIN) 10-40 MG per tablet Take 1 tablet by mouth at bedtime.        . furosemide (LASIX) 40 MG tablet       . Garlic 500 MG TABS Take by mouth.        Katheran James Jelly (GINSENG COMPLEX/ROYAL JELLY) 800-200 MG CAPS Take by mouth.        Marland Kitchen GLIPIZIDE PO Take by mouth.        . Lecithin 1200 MG CAPS Take by mouth daily.        Marland Kitchen METFORMIN HCL PO Take by mouth.        . Omega-3 Fatty Acids (SALMON OIL-1000 PO) Take 2 tablets by mouth 2 (two) times daily.        . Ramipril (ALTACE PO) Take by mouth.        . Saw Palmetto, Serenoa repens, 160 MG TABS Take by mouth 2 (two) times daily.        . simvastatin (ZOCOR) 20 MG tablet       . St Johns Wort 300 MG CAPS Take 300 mg by mouth daily as needed.        . sulfamethoxazole-trimethoprim (BACTRIM DS) 800-160 MG per  tablet       . Tamsulosin HCl (FLOMAX) 0.4 MG CAPS       . vitamin E 1000 UNIT capsule Take 1,000 Units by mouth daily.        Marland Kitchen zinc gluconate 50 MG tablet Take 50 mg by mouth daily.        Marland Kitchen AMLODIPINE BESYLATE PO Take by mouth.          Physical Examination  Filed Vitals:   12/09/12 0940  BP: 132/78  Pulse: 69  Resp: 18    Body mass index is 27.34 kg/(m^2).  General:  WDWN in NAD Gait: Normal HEENT: WNL Eyes: Pupils equal Pulmonary: normal non-labored breathing , without Rales, rhonchi,  wheezing Cardiac: RRR, without  Murmurs, rubs or gallops; Abdomen: soft, NT, no masses Skin: no rashes, ulcers noted  Vascular Exam Pulses: 3+ radial pulses bilaterally Carotid bruits: Carotid pulses to auscultation no bruits are heard Extremities without ischemic changes, no Gangrene , no cellulitis; no open wounds;  Musculoskeletal: no muscle wasting or atrophy   Neurologic: A&O X 3; Appropriate Affect ; SENSATION: normal; MOTOR FUNCTION:  moving all extremities equally. Speech is fluent/normal  Non-Invasive Vascular Imaging AAA duplex today shows a maximum diameter of 4.25 AP by 4.26 transverse which is essentially unchanged from previous exam one year ago when he was 3.8 AP by 4.3 transverse  ASSESSMENT/PLAN: Asymptomatic AAA that will followup in one year with repeat duplex. The patient's questions were encouraged and answered, he is in agreement with this plan. The patient knows the signs and symptoms of rupture and knows to call 911 if that should occur.  Lauree Chandler ANP   Clinic MD: Early

## 2012-12-10 ENCOUNTER — Encounter: Payer: Self-pay | Admitting: Neurosurgery

## 2012-12-10 NOTE — Addendum Note (Signed)
Addended by: Lorin Mercy K on: 12/10/2012 03:00 PM   Modules accepted: Orders

## 2013-02-27 ENCOUNTER — Emergency Department (HOSPITAL_COMMUNITY): Payer: No Typology Code available for payment source

## 2013-02-27 ENCOUNTER — Emergency Department (HOSPITAL_COMMUNITY)
Admission: EM | Admit: 2013-02-27 | Discharge: 2013-02-27 | Disposition: A | Discharge status: Home / Self Care | Payer: No Typology Code available for payment source | Attending: Emergency Medicine | Admitting: Emergency Medicine

## 2013-02-27 ENCOUNTER — Encounter (HOSPITAL_COMMUNITY): Payer: Self-pay | Admitting: *Deleted

## 2013-02-27 DIAGNOSIS — Z87891 Personal history of nicotine dependence: Secondary | ICD-10-CM | POA: Insufficient documentation

## 2013-02-27 DIAGNOSIS — Z79899 Other long term (current) drug therapy: Secondary | ICD-10-CM | POA: Insufficient documentation

## 2013-02-27 DIAGNOSIS — Y9389 Activity, other specified: Secondary | ICD-10-CM | POA: Insufficient documentation

## 2013-02-27 DIAGNOSIS — J4489 Other specified chronic obstructive pulmonary disease: Secondary | ICD-10-CM | POA: Insufficient documentation

## 2013-02-27 DIAGNOSIS — J449 Chronic obstructive pulmonary disease, unspecified: Secondary | ICD-10-CM | POA: Insufficient documentation

## 2013-02-27 DIAGNOSIS — Z7982 Long term (current) use of aspirin: Secondary | ICD-10-CM | POA: Insufficient documentation

## 2013-02-27 DIAGNOSIS — Z8739 Personal history of other diseases of the musculoskeletal system and connective tissue: Secondary | ICD-10-CM | POA: Insufficient documentation

## 2013-02-27 DIAGNOSIS — E119 Type 2 diabetes mellitus without complications: Secondary | ICD-10-CM | POA: Insufficient documentation

## 2013-02-27 DIAGNOSIS — I509 Heart failure, unspecified: Secondary | ICD-10-CM | POA: Insufficient documentation

## 2013-02-27 DIAGNOSIS — S0031XA Abrasion of nose, initial encounter: Secondary | ICD-10-CM

## 2013-02-27 DIAGNOSIS — Z8679 Personal history of other diseases of the circulatory system: Secondary | ICD-10-CM | POA: Insufficient documentation

## 2013-02-27 DIAGNOSIS — N189 Chronic kidney disease, unspecified: Secondary | ICD-10-CM | POA: Insufficient documentation

## 2013-02-27 DIAGNOSIS — IMO0002 Reserved for concepts with insufficient information to code with codable children: Secondary | ICD-10-CM | POA: Insufficient documentation

## 2013-02-27 DIAGNOSIS — S0990XA Unspecified injury of head, initial encounter: Secondary | ICD-10-CM | POA: Insufficient documentation

## 2013-02-27 DIAGNOSIS — R42 Dizziness and giddiness: Secondary | ICD-10-CM | POA: Insufficient documentation

## 2013-02-27 DIAGNOSIS — E7800 Pure hypercholesterolemia, unspecified: Secondary | ICD-10-CM | POA: Insufficient documentation

## 2013-02-27 DIAGNOSIS — I10 Essential (primary) hypertension: Secondary | ICD-10-CM | POA: Insufficient documentation

## 2013-02-27 DIAGNOSIS — J438 Other emphysema: Secondary | ICD-10-CM | POA: Insufficient documentation

## 2013-02-27 NOTE — ED Notes (Signed)
Attempted rounding - doctor in with pt
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
## 2013-03-20 ENCOUNTER — Other Ambulatory Visit: Payer: Medicare Other

## 2013-03-20 ENCOUNTER — Other Ambulatory Visit: Payer: Self-pay | Admitting: Geriatric Medicine

## 2013-03-20 DIAGNOSIS — G44009 Cluster headache syndrome, unspecified, not intractable: Secondary | ICD-10-CM

## 2013-03-23 ENCOUNTER — Other Ambulatory Visit: Payer: Self-pay | Admitting: Geriatric Medicine

## 2013-03-23 DIAGNOSIS — G44009 Cluster headache syndrome, unspecified, not intractable: Secondary | ICD-10-CM

## 2013-03-24 ENCOUNTER — Ambulatory Visit
Admission: RE | Admit: 2013-03-24 | Discharge: 2013-03-24 | Disposition: A | Discharge status: Home / Self Care | Payer: Medicare Other | Source: Ambulatory Visit | Attending: Geriatric Medicine | Admitting: Geriatric Medicine

## 2013-03-24 ENCOUNTER — Inpatient Hospital Stay: Admission: RE | Admit: 2013-03-24 | Payer: Medicare Other | Source: Ambulatory Visit

## 2013-03-24 DIAGNOSIS — G44009 Cluster headache syndrome, unspecified, not intractable: Secondary | ICD-10-CM

## 2013-04-07 ENCOUNTER — Ambulatory Visit
Admission: RE | Admit: 2013-04-07 | Discharge: 2013-04-07 | Disposition: A | Discharge status: Home / Self Care | Payer: Medicare Other | Source: Ambulatory Visit | Attending: Geriatric Medicine | Admitting: Geriatric Medicine

## 2013-04-16 ENCOUNTER — Ambulatory Visit: Payer: Medicare Other | Attending: Geriatric Medicine | Admitting: Physical Therapy

## 2013-04-16 DIAGNOSIS — M542 Cervicalgia: Secondary | ICD-10-CM | POA: Insufficient documentation

## 2013-04-16 DIAGNOSIS — IMO0001 Reserved for inherently not codable concepts without codable children: Secondary | ICD-10-CM | POA: Insufficient documentation

## 2013-04-20 ENCOUNTER — Encounter: Payer: Self-pay | Admitting: Physical Therapy

## 2013-04-21 ENCOUNTER — Ambulatory Visit: Payer: Medicare Other | Admitting: Physical Therapy

## 2013-04-24 ENCOUNTER — Ambulatory Visit: Payer: Medicare Other | Admitting: Physical Therapy

## 2013-04-28 ENCOUNTER — Ambulatory Visit: Payer: Medicare Other | Admitting: Physical Therapy

## 2013-04-30 ENCOUNTER — Ambulatory Visit: Payer: Medicare Other | Admitting: Physical Therapy

## 2013-04-30 ENCOUNTER — Ambulatory Visit: Payer: Medicare Other | Attending: Orthopedic Surgery | Admitting: Physical Therapy

## 2013-04-30 DIAGNOSIS — M542 Cervicalgia: Secondary | ICD-10-CM | POA: Insufficient documentation

## 2013-04-30 DIAGNOSIS — IMO0001 Reserved for inherently not codable concepts without codable children: Secondary | ICD-10-CM | POA: Insufficient documentation

## 2013-05-04 ENCOUNTER — Ambulatory Visit: Payer: Medicare Other | Admitting: Physical Therapy

## 2013-05-05 ENCOUNTER — Ambulatory Visit: Payer: Medicare Other | Admitting: Physical Therapy

## 2013-05-28 ENCOUNTER — Ambulatory Visit: Payer: Medicare Other | Admitting: Physical Therapy

## 2013-05-29 ENCOUNTER — Ambulatory Visit: Payer: Medicare Other | Admitting: Physical Therapy

## 2013-06-01 ENCOUNTER — Ambulatory Visit: Payer: Medicare Other | Attending: Orthopedic Surgery | Admitting: Rehabilitation

## 2013-06-01 ENCOUNTER — Other Ambulatory Visit (HOSPITAL_COMMUNITY): Payer: Self-pay | Admitting: Geriatric Medicine

## 2013-06-01 DIAGNOSIS — IMO0001 Reserved for inherently not codable concepts without codable children: Secondary | ICD-10-CM | POA: Insufficient documentation

## 2013-06-01 DIAGNOSIS — M542 Cervicalgia: Secondary | ICD-10-CM | POA: Insufficient documentation

## 2013-06-01 DIAGNOSIS — J441 Chronic obstructive pulmonary disease with (acute) exacerbation: Secondary | ICD-10-CM

## 2013-06-08 ENCOUNTER — Ambulatory Visit: Payer: Medicare Other

## 2013-06-09 ENCOUNTER — Ambulatory Visit (HOSPITAL_COMMUNITY)
Admission: RE | Admit: 2013-06-09 | Discharge: 2013-06-09 | Disposition: A | Discharge status: Home / Self Care | Payer: Medicare Other | Source: Ambulatory Visit | Attending: Geriatric Medicine | Admitting: Geriatric Medicine

## 2013-06-09 DIAGNOSIS — J449 Chronic obstructive pulmonary disease, unspecified: Secondary | ICD-10-CM | POA: Insufficient documentation

## 2013-06-09 DIAGNOSIS — J4489 Other specified chronic obstructive pulmonary disease: Secondary | ICD-10-CM | POA: Insufficient documentation

## 2013-06-09 LAB — PULMONARY FUNCTION TEST

## 2013-06-09 MED ORDER — ALBUTEROL SULFATE (5 MG/ML) 0.5% IN NEBU
2.5000 mg | INHALATION_SOLUTION | Freq: Once | RESPIRATORY_TRACT | Status: AC
  Administered 2013-06-09: 2.5 mg via RESPIRATORY_TRACT

## 2013-06-10 ENCOUNTER — Encounter: Payer: Medicare Other | Admitting: Physical Therapy

## 2013-07-09 ENCOUNTER — Encounter: Payer: Self-pay | Admitting: Emergency Medicine

## 2013-07-10 ENCOUNTER — Ambulatory Visit (INDEPENDENT_AMBULATORY_CARE_PROVIDER_SITE_OTHER): Payer: Medicare Other | Admitting: Emergency Medicine

## 2013-07-10 ENCOUNTER — Encounter: Payer: Self-pay | Admitting: Emergency Medicine

## 2013-07-10 VITALS — BP 150/78 | HR 85 | Temp 96.8°F | Ht 71.0 in | Wt 195.8 lb

## 2013-07-10 DIAGNOSIS — R0609 Other forms of dyspnea: Secondary | ICD-10-CM

## 2013-07-10 DIAGNOSIS — R0600 Dyspnea, unspecified: Secondary | ICD-10-CM | POA: Insufficient documentation

## 2013-07-10 NOTE — Patient Instructions (Addendum)
Walking oximetry today shows that your level will drop slightly with exertion.  We will perform a CT scan of the chest to review next time We will start Spiriva once a day to see if this helps your breathing Follow with Dr Delton Coombes in 1 month

## 2013-07-10 NOTE — Assessment & Plan Note (Addendum)
Etiology unclear. He does have restriction on PFT that may be causing [place] to underestimate his AFL. He has been tries on BD;s without much improvement. He has a diffusion defect that points to a potential vascular or cardiac cause. Note his lasix needed to be decreased about the same time he worsened 6 mon ago. I don't hear crackles or wheeze on exam.  - walking oximetry today show possible desat after 3 laps - trial spiriva - consider CT scan chest (no contrast) and or V/Q scan.  - may need more complete cards eval although he is at his dry wt currently.
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
## 2013-07-16 ENCOUNTER — Ambulatory Visit (INDEPENDENT_AMBULATORY_CARE_PROVIDER_SITE_OTHER)
Admission: RE | Admit: 2013-07-16 | Discharge: 2013-07-16 | Disposition: A | Discharge status: Home / Self Care | Payer: Medicare Other | Source: Ambulatory Visit | Attending: Emergency Medicine | Admitting: Emergency Medicine

## 2013-07-16 DIAGNOSIS — R0609 Other forms of dyspnea: Secondary | ICD-10-CM

## 2013-07-16 DIAGNOSIS — R0600 Dyspnea, unspecified: Secondary | ICD-10-CM

## 2013-07-28 NOTE — Progress Notes (Signed)
Quick Note:  Spoke with patient, informed him of results as listed below per RB Verbalized understanding and nothing further needed at this time ______

## 2013-07-31 ENCOUNTER — Ambulatory Visit (INDEPENDENT_AMBULATORY_CARE_PROVIDER_SITE_OTHER): Payer: Medicare Other | Admitting: Adult Health

## 2013-07-31 ENCOUNTER — Ambulatory Visit: Payer: Medicare Other | Admitting: Emergency Medicine

## 2013-07-31 ENCOUNTER — Encounter: Payer: Self-pay | Admitting: Adult Health

## 2013-07-31 VITALS — BP 132/80 | HR 80 | Temp 97.3°F | Ht 71.0 in | Wt 194.8 lb

## 2013-07-31 DIAGNOSIS — R0600 Dyspnea, unspecified: Secondary | ICD-10-CM

## 2013-07-31 DIAGNOSIS — R0609 Other forms of dyspnea: Secondary | ICD-10-CM

## 2013-07-31 MED ORDER — TIOTROPIUM BROMIDE MONOHYDRATE 18 MCG IN CAPS: 18.0000 ug | ORAL_CAPSULE | Freq: Every day | RESPIRATORY_TRACT | Status: DC

## 2013-07-31 NOTE — Patient Instructions (Addendum)
Follow up with Dr. Delton Coombes  In 3 months and As needed   Continue on Spiriva 1 puff daily
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
## 2013-08-05 ENCOUNTER — Other Ambulatory Visit: Payer: Self-pay

## 2013-08-05 NOTE — Assessment & Plan Note (Addendum)
Dyspnea -suspect due to chronic changes /fibrosis on CT scan  PFT shows mild AFL  Plan will be to monitor. Patient closely and repeat spirometry in 6 months  Plan   Follow up with Dr. Delton Coombes  In 3 months and As needed   Continue on Spiriva 1 puff daily

## 2013-10-30 ENCOUNTER — Ambulatory Visit: Payer: Medicare Other | Admitting: Emergency Medicine

## 2013-11-05 ENCOUNTER — Other Ambulatory Visit: Payer: Self-pay

## 2013-12-08 ENCOUNTER — Ambulatory Visit: Payer: Medicare Other | Admitting: Cardiology

## 2013-12-09 ENCOUNTER — Ambulatory Visit: Payer: Medicare Other | Admitting: Emergency Medicine

## 2013-12-11 ENCOUNTER — Encounter (HOSPITAL_COMMUNITY): Payer: Medicare Other

## 2013-12-11 ENCOUNTER — Other Ambulatory Visit (HOSPITAL_COMMUNITY): Payer: Medicare Other

## 2013-12-11 ENCOUNTER — Ambulatory Visit: Payer: Medicare Other | Admitting: Family

## 2013-12-14 ENCOUNTER — Ambulatory Visit (INDEPENDENT_AMBULATORY_CARE_PROVIDER_SITE_OTHER): Payer: Medicare Other | Admitting: Emergency Medicine

## 2013-12-14 ENCOUNTER — Encounter: Payer: Self-pay | Admitting: Emergency Medicine

## 2013-12-14 VITALS — BP 140/84 | HR 109 | Ht 70.5 in | Wt 196.6 lb

## 2013-12-14 DIAGNOSIS — J849 Interstitial pulmonary disease, unspecified: Secondary | ICD-10-CM | POA: Insufficient documentation

## 2013-12-14 DIAGNOSIS — J069 Acute upper respiratory infection, unspecified: Secondary | ICD-10-CM

## 2013-12-14 DIAGNOSIS — J8410 Pulmonary fibrosis, unspecified: Secondary | ICD-10-CM

## 2013-12-14 DIAGNOSIS — J449 Chronic obstructive pulmonary disease, unspecified: Secondary | ICD-10-CM | POA: Insufficient documentation

## 2013-12-14 MED ORDER — DOXYCYCLINE HYCLATE 100 MG PO TABS: 100.0000 mg | ORAL_TABLET | Freq: Two times a day (BID) | ORAL | Status: DC

## 2013-12-14 MED ORDER — PREDNISONE 10 MG PO TABS: ORAL_TABLET | ORAL | Status: DC

## 2013-12-14 NOTE — Patient Instructions (Signed)
Please continue to use over-the-counter medication for symptom relief of her upper for infection He will be given prescriptions for both prednisone and doxycycline. They should be filled if you develop symptoms consistent with a COPD flareup. These include fever, more wheezing, more shortness of breath, increased cough, increased mucus production and a change in the color of the mucus. Please call our office to let us know if you decide to fill these prescriptions Follow with Dr Delton Coombes in 4 months or sooner if you have any problems.

## 2013-12-14 NOTE — Assessment & Plan Note (Signed)
Scripts given for pred and doxy for him to fill if this URI progresses to an AE-COPD. He will call us if he has to fill these.

## 2013-12-14 NOTE — Assessment & Plan Note (Signed)
Discussed sx. Doubt flu at this point.  - symptomatic relief with OTC regimen - check nasal flu PCR if possible; no Tamiflu at this time

## 2013-12-14 NOTE — Assessment & Plan Note (Signed)
Has been clinically stable. No repeat imaging at this time.
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
## 2013-12-15 ENCOUNTER — Ambulatory Visit: Payer: Medicare Other | Admitting: Neurosurgery

## 2013-12-15 ENCOUNTER — Ambulatory Visit: Payer: Medicare Other | Admitting: Cardiology

## 2013-12-17 ENCOUNTER — Encounter: Payer: Self-pay | Admitting: Family

## 2013-12-17 ENCOUNTER — Ambulatory Visit: Payer: Self-pay | Admitting: Podiatry

## 2013-12-18 ENCOUNTER — Encounter: Payer: Self-pay | Admitting: Family

## 2013-12-18 ENCOUNTER — Ambulatory Visit (HOSPITAL_COMMUNITY)
Admission: RE | Admit: 2013-12-18 | Discharge: 2013-12-18 | Disposition: A | Discharge status: Home / Self Care | Payer: Medicare Other | Source: Ambulatory Visit | Attending: Family | Admitting: Family

## 2013-12-18 ENCOUNTER — Ambulatory Visit (INDEPENDENT_AMBULATORY_CARE_PROVIDER_SITE_OTHER): Payer: Medicare Other | Admitting: Family

## 2013-12-18 VITALS — BP 146/76 | HR 70 | Resp 16 | Ht 70.5 in | Wt 192.0 lb

## 2013-12-18 DIAGNOSIS — I714 Abdominal aortic aneurysm, without rupture, unspecified: Secondary | ICD-10-CM
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
## 2013-12-18 NOTE — Patient Instructions (Signed)

## 2013-12-21 ENCOUNTER — Ambulatory Visit (INDEPENDENT_AMBULATORY_CARE_PROVIDER_SITE_OTHER): Payer: Medicare Other | Admitting: Cardiology

## 2013-12-21 ENCOUNTER — Encounter: Payer: Self-pay | Admitting: Cardiology

## 2013-12-21 VITALS — BP 150/78 | HR 95 | Ht 70.5 in | Wt 189.0 lb

## 2013-12-21 DIAGNOSIS — I10 Essential (primary) hypertension: Secondary | ICD-10-CM

## 2013-12-21 DIAGNOSIS — E7800 Pure hypercholesterolemia, unspecified: Secondary | ICD-10-CM

## 2013-12-21 DIAGNOSIS — I5032 Chronic diastolic (congestive) heart failure: Secondary | ICD-10-CM

## 2013-12-21 NOTE — Patient Instructions (Signed)
Your physician recommends that you continue on your current medications as directed. Please refer to the Current Medication list given to you today.  Your physician wants you to follow-up in: 6 months with Dr. Skains. You will receive a reminder letter in the mail two months in advance. If you don't receive a letter, please call our office to schedule the follow-up appointment.  
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
## 2013-12-22 ENCOUNTER — Encounter: Payer: Self-pay | Admitting: *Deleted

## 2014-01-13 ENCOUNTER — Ambulatory Visit: Payer: Self-pay | Admitting: Podiatry

## 2014-01-14 ENCOUNTER — Ambulatory Visit: Payer: Self-pay | Admitting: Podiatry

## 2014-01-28 ENCOUNTER — Ambulatory Visit: Payer: Self-pay | Admitting: Podiatry

## 2014-02-19 ENCOUNTER — Ambulatory Visit (INDEPENDENT_AMBULATORY_CARE_PROVIDER_SITE_OTHER): Payer: Medicare Other | Admitting: Cardiology

## 2014-02-19 ENCOUNTER — Encounter: Payer: Self-pay | Admitting: Cardiology

## 2014-02-19 VITALS — BP 120/62 | HR 68 | Ht 70.5 in | Wt 202.0 lb

## 2014-02-19 DIAGNOSIS — E7800 Pure hypercholesterolemia, unspecified: Secondary | ICD-10-CM

## 2014-02-19 DIAGNOSIS — I208 Other forms of angina pectoris: Secondary | ICD-10-CM

## 2014-02-19 DIAGNOSIS — J4489 Other specified chronic obstructive pulmonary disease: Secondary | ICD-10-CM

## 2014-02-19 DIAGNOSIS — J449 Chronic obstructive pulmonary disease, unspecified: Secondary | ICD-10-CM

## 2014-02-19 DIAGNOSIS — I2089 Other forms of angina pectoris: Secondary | ICD-10-CM

## 2014-02-19 DIAGNOSIS — I5032 Chronic diastolic (congestive) heart failure: Secondary | ICD-10-CM
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
## 2014-02-19 NOTE — Patient Instructions (Signed)
Your physician recommends that you continue on your current medications as directed. Please refer to the Current Medication list given to you today.  Your physician has requested that you have a lexiscan myoview. For further information please visit [email or web address]. Please follow instruction sheet, as given.  Your physician wants you to follow-up in: 6 months with Dr. Marlou Porch. You will receive a reminder letter in the mail two months in advance. If you don't receive a letter, please call our office to schedule the follow-up appointment.

## 2014-02-25 ENCOUNTER — Ambulatory Visit (INDEPENDENT_AMBULATORY_CARE_PROVIDER_SITE_OTHER): Payer: Medicare Other | Admitting: Podiatry

## 2014-02-25 ENCOUNTER — Encounter: Payer: Self-pay | Admitting: Podiatry

## 2014-02-25 VITALS — BP 105/73 | HR 95 | Resp 16

## 2014-02-25 DIAGNOSIS — B351 Tinea unguium: Secondary | ICD-10-CM

## 2014-02-25 DIAGNOSIS — M79609 Pain in unspecified limb: Secondary | ICD-10-CM

## 2014-02-25 NOTE — Patient Instructions (Signed)
Diabetes and Foot Care Diabetes may cause you to have problems because of poor blood supply (circulation) to your feet and legs. This may cause the skin on your feet to become thinner, break easier, and heal more slowly. Your skin may become dry, and the skin may peel and crack. You may also have nerve damage in your legs and feet causing decreased feeling in them. You may not notice minor injuries to your feet that could lead to infections or more serious problems. Taking care of your feet is one of the most important things you can do for yourself.  HOME CARE INSTRUCTIONS  Wear shoes at all times, even in the house. Do not go barefoot. Bare feet are easily injured.  Check your feet daily for blisters, cuts, and redness. If you cannot see the bottom of your feet, use a mirror or ask someone for help.  Wash your feet with warm water (do not use hot water) and mild soap. Then pat your feet and the areas between your toes until they are completely dry. Do not soak your feet as this can dry your skin.  Apply a moisturizing lotion or petroleum jelly (that does not contain alcohol and is unscented) to the skin on your feet and to dry, brittle toenails. Do not apply lotion between your toes.  Trim your toenails straight across. Do not dig under them or around the cuticle. File the edges of your nails with an emery board or nail file.  Do not cut corns or calluses or try to remove them with medicine.  Wear clean socks or stockings every day. Make sure they are not too tight. Do not wear knee-high stockings since they may decrease blood flow to your legs.  Wear shoes that fit properly and have enough cushioning. To break in new shoes, wear them for just a few hours a day. This prevents you from injuring your feet. Always look in your shoes before you put them on to be sure there are no objects inside.  Do not cross your legs. This may decrease the blood flow to your feet.  If you find a minor scrape,  cut, or break in the skin on your feet, keep it and the skin around it clean and dry. These areas may be cleansed with mild soap and water. Do not cleanse the area with peroxide, alcohol, or iodine.  When you remove an adhesive bandage, be sure not to damage the skin around it.  If you have a wound, look at it several times a day to make sure it is healing.  Do not use heating pads or hot water bottles. They may burn your skin. If you have lost feeling in your feet or legs, you may not know it is happening until it is too late.  Make sure your health care provider performs a complete foot exam at least annually or more often if you have foot problems. Report any cuts, sores, or bruises to your health care provider immediately. SEEK MEDICAL CARE IF:   You have an injury that is not healing.  You have cuts or breaks in the skin.  You have an ingrown nail.  You notice redness on your legs or feet.  You feel burning or tingling in your legs or feet.  You have pain or cramps in your legs and feet.  Your legs or feet are numb.  Your feet always feel cold. SEEK IMMEDIATE MEDICAL CARE IF:   There is increasing redness,   swelling, or pain in or around a wound.  There is a red line that goes up your leg.  Pus is coming from a wound.  You develop a fever or as directed by your health care provider.  You notice a bad smell coming from an ulcer or wound. Document Released: 12/14/2000 Document Revised: 08/19/2013 Document Reviewed: 05/26/2013 ExitCare Patient Information 2014 [employer].  

## 2014-02-26 NOTE — Progress Notes (Signed)
Subjective:     [name], male   DOB: [birth year], 80 y.o.   MRN: [id number]  HPI patient presents with thick toenails they're painful 1-5 of both feet   Review of Systems     Objective:   Physical Exam Neurovascular status intact with thick painful nailbeds 1-5 both feet    Assessment:     Mycotic nail infection etc. painful 1-5 of both feet    Plan:     Debridement painful nailbeds 1-5 both feet with no iatrogenic bleeding noted

## 2014-03-08 ENCOUNTER — Ambulatory Visit (HOSPITAL_COMMUNITY): Payer: Medicare Other | Attending: Cardiology | Admitting: Radiology

## 2014-03-08 ENCOUNTER — Encounter: Payer: Self-pay | Admitting: Cardiology

## 2014-03-08 VITALS — BP 156/93 | Ht 71.0 in | Wt 199.0 lb

## 2014-03-08 DIAGNOSIS — J449 Chronic obstructive pulmonary disease, unspecified: Secondary | ICD-10-CM | POA: Insufficient documentation

## 2014-03-08 DIAGNOSIS — I4949 Other premature depolarization: Secondary | ICD-10-CM

## 2014-03-08 DIAGNOSIS — G4730 Sleep apnea, unspecified: Secondary | ICD-10-CM | POA: Insufficient documentation

## 2014-03-08 DIAGNOSIS — E119 Type 2 diabetes mellitus without complications: Secondary | ICD-10-CM | POA: Insufficient documentation

## 2014-03-08 DIAGNOSIS — J4489 Other specified chronic obstructive pulmonary disease: Secondary | ICD-10-CM | POA: Insufficient documentation

## 2014-03-08 DIAGNOSIS — Z87891 Personal history of nicotine dependence: Secondary | ICD-10-CM | POA: Insufficient documentation

## 2014-03-08 DIAGNOSIS — I2510 Atherosclerotic heart disease of native coronary artery without angina pectoris: Secondary | ICD-10-CM | POA: Insufficient documentation

## 2014-03-08 DIAGNOSIS — Z86711 Personal history of pulmonary embolism: Secondary | ICD-10-CM | POA: Insufficient documentation

## 2014-03-08 DIAGNOSIS — Z8249 Family history of ischemic heart disease and other diseases of the circulatory system: Secondary | ICD-10-CM | POA: Insufficient documentation

## 2014-03-08 DIAGNOSIS — I10 Essential (primary) hypertension: Secondary | ICD-10-CM | POA: Insufficient documentation

## 2014-03-08 DIAGNOSIS — I739 Peripheral vascular disease, unspecified: Secondary | ICD-10-CM | POA: Insufficient documentation

## 2014-03-08 DIAGNOSIS — I4510 Unspecified right bundle-branch block: Secondary | ICD-10-CM | POA: Insufficient documentation

## 2014-03-08 DIAGNOSIS — R0989 Other specified symptoms and signs involving the circulatory and respiratory systems: Principal | ICD-10-CM | POA: Insufficient documentation

## 2014-03-08 DIAGNOSIS — R002 Palpitations: Secondary | ICD-10-CM | POA: Insufficient documentation

## 2014-03-08 DIAGNOSIS — I208 Other forms of angina pectoris: Secondary | ICD-10-CM

## 2014-03-08 DIAGNOSIS — R0609 Other forms of dyspnea: Secondary | ICD-10-CM | POA: Insufficient documentation

## 2014-03-08 DIAGNOSIS — R079 Chest pain, unspecified: Secondary | ICD-10-CM

## 2014-03-08 DIAGNOSIS — R0602 Shortness of breath: Secondary | ICD-10-CM | POA: Insufficient documentation

## 2014-03-08 DIAGNOSIS — E785 Hyperlipidemia, unspecified: Secondary | ICD-10-CM | POA: Insufficient documentation

## 2014-03-08 MED ORDER — REGADENOSON 0.4 MG/5ML IV SOLN
0.4000 mg | Freq: Once | INTRAVENOUS | Status: AC
  Administered 2014-03-08: 0.4 mg via INTRAVENOUS

## 2014-03-08 MED ORDER — TECHNETIUM TC 99M SESTAMIBI GENERIC - CARDIOLITE
33.0000 | Freq: Once | INTRAVENOUS | Status: AC | PRN
Start: 2014-03-08 — End: 2014-03-08
  Administered 2014-03-08: 33 via INTRAVENOUS

## 2014-03-08 MED ORDER — TECHNETIUM TC 99M SESTAMIBI GENERIC - CARDIOLITE
11.0000 | Freq: Once | INTRAVENOUS | Status: AC | PRN
Start: 2014-03-08 — End: 2014-03-08
  Administered 2014-03-08: 11 via INTRAVENOUS

## 2014-03-08 NOTE — Progress Notes (Signed)
[name], [name] [phone]    Cardiology Nuclear Med Study  Alan Ford is a 79 y.o. male     MRN : [id number]     DOB: 1930/04/19  Procedure Date: 03/08/2014  Nuclear Med Background Indication for Stress Test:  Evaluation for Ischemia History:CAD;Cath PE/CAD(per pt back of heart);Echo 10' EF: 55-60%;Previous Nuclear Study 10' Normal EF Nml;COPD; HX freq PVCs;Sleep Apnea/CPAP Cardiac Risk Factors: Strong Family History - CAD, History of Smoking, Hypertension, Lipids, NIDDM, PVD and RBBB  Symptoms:  DOE, Palpitations, SOB and neck discomfort>shoulder/back    Nuclear Pre-Procedure Caffeine/Decaff Intake:  None > 12 hrs NPO After: 11:00pm   Lungs:  clear O2 Sat: 94% on room air. IV 0.9% NS with Angio Cath:  22g  IV Site: R Wrist x 1, tolerated well IV Started by:  Irven Baltimore, RN  Chest Size (in):  40 Cup Size: n/a  Height: 5\' 11"  (1.803 m)  Weight:  199 lb (90.266 kg)  BMI:  Body mass index is 27.77 kg/(m^2). Tech Comments:  Held Coreg this am. Fasting CBG was 184 at 0830 per patient, and he took Glipizide and Metformin this am. Irven Baltimore, RN.    Nuclear Med Study 1 or 2 day study: 1 day  Stress Test Type:  Lexiscan  Reading MD: N/A  Order Authorizing Provider:  Candee Furbish, MD  Resting Radionuclide: Technetium 66m Sestamibi  Resting Radionuclide Dose: 11.0 mCi   Stress Radionuclide:  Technetium 93m Sestamibi  Stress Radionuclide Dose: 33.0 mCi           Stress Protocol Rest HR: 82 Stress HR: 94  Rest BP: 156/93 Stress BP: 151/72  Exercise Time (min): n/a METS: n/a   Predicted Max HR: 138 bpm % Max HR: 66.67 bpm Rate Pressure Product: 14352   Dose of Adenosine (mg):  n/a Dose of Lexiscan: 0.4 mg  Dose of Atropine (mg): n/a Dose of Dobutamine: n/a mcg/kg/min (at max HR)  Stress Test Technologist: Ileene Hutchinson, EMT-P  Nuclear Technologist:  Charlton Amor, CNMT     Rest Procedure:  Myocardial  perfusion imaging was performed at rest 45 minutes following the intravenous administration of Technetium 81m Sestamibi. Rest [id number]  First degree AV block  Occasional PVC   Stress Procedure:  The patient received IV Lexiscan 0.4 mg over 15-seconds.  Technetium 42m Sestamibi injected at 30-seconds.  Quantitative spect images were obtained after a 45 minute delay. Stress ECG: No significant change from baseline ECG  QPS Raw Data Images: Soft tissue (diaphragm) underlies heart.   Stress Images:  Thinning in the inferior wall (base, minimally mid), inferolateral wall (base)  Otherwise normal perfusion.   Rest Images:  Comparison with the stress images reveals no significant change. Subtraction (SDS):  No evidence of ischemia. Transient Ischemic Dilatation (Normal <1.22):  1.00 Lung/Heart Ratio (Normal <0.45):  0.31  Quantitative Gated Spect Images QGS EDV:  NA QGS ESV:  NA  Impression Exercise Capacity:  Lexiscan with no exercise. BP Response:  Normal blood pressure response. Clinical Symptoms:  No chest pain. ECG Impression:  No significant ST segment change suggestive of ischemia. Comparison with Prior Nuclear Study: Reported normal scan in 2010  No images to compare    Overall Impression: Basal inferior thinning consistist with soft tissue attenuation, cannot completely exclude scar. Otherwise normal perfusion.  Images not gated due to ectopy.  Overall low risk scan.    LV Ejection Fraction: nOT GATED  LV Wall Motion:  NA  Dorris Carnes

## 2014-03-11 ENCOUNTER — Telehealth: Payer: Self-pay | Admitting: *Deleted

## 2014-03-11 NOTE — Telephone Encounter (Signed)
lmtcb for stress test results. number provided and office hours provided.

## 2014-04-13 ENCOUNTER — Encounter: Payer: Self-pay | Admitting: Emergency Medicine

## 2014-04-13 ENCOUNTER — Ambulatory Visit (INDEPENDENT_AMBULATORY_CARE_PROVIDER_SITE_OTHER): Payer: Medicare Other | Admitting: Emergency Medicine

## 2014-04-13 VITALS — BP 138/70 | HR 79 | Ht 70.5 in | Wt 197.0 lb

## 2014-04-13 DIAGNOSIS — J849 Interstitial pulmonary disease, unspecified: Secondary | ICD-10-CM

## 2014-04-13 DIAGNOSIS — J8410 Pulmonary fibrosis, unspecified: Secondary | ICD-10-CM

## 2014-04-13 DIAGNOSIS — J449 Chronic obstructive pulmonary disease, unspecified: Secondary | ICD-10-CM
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
## 2014-04-13 NOTE — Assessment & Plan Note (Signed)
-   trial off spiriva to see if he notices a decline in fxning

## 2014-04-13 NOTE — Assessment & Plan Note (Signed)
-   given his clinical decline will repeat his CT scan to compare with 06/2013 - walking oximetry on RA

## 2014-04-13 NOTE — Patient Instructions (Addendum)
We will try stopping Spiriva until next visit. Keep track of your symptoms so we can see if you miss it.  We will perform a CT scan of your chest to compare with last year Walking oximetry today on room air shows that you would probably benefit from wearing oxygen. Follow with Dr Lamonte Sakai in 2 months or sooner if you have any problems.

## 2014-04-16 ENCOUNTER — Telehealth: Payer: Self-pay | Admitting: Emergency Medicine

## 2014-04-16 DIAGNOSIS — J449 Chronic obstructive pulmonary disease, unspecified: Secondary | ICD-10-CM

## 2014-04-16 NOTE — Telephone Encounter (Signed)
Pt reports he spoke with PCP this AM. He was advised he did need to start on O2 so he does not overwork his heart when he is doing activity. He wants to let RB set him up. Pt aware he is not back on the office until Wednesday. He is fine with this. Please advise RB thanks

## 2014-04-19 ENCOUNTER — Ambulatory Visit (INDEPENDENT_AMBULATORY_CARE_PROVIDER_SITE_OTHER)
Admission: RE | Admit: 2014-04-19 | Discharge: 2014-04-19 | Disposition: A | Discharge status: Home / Self Care | Payer: Medicare Other | Source: Ambulatory Visit | Attending: Emergency Medicine | Admitting: Emergency Medicine

## 2014-04-19 DIAGNOSIS — J849 Interstitial pulmonary disease, unspecified: Secondary | ICD-10-CM

## 2014-04-19 DIAGNOSIS — J8410 Pulmonary fibrosis, unspecified: Secondary | ICD-10-CM

## 2014-04-21 ENCOUNTER — Telehealth: Payer: Self-pay | Admitting: Emergency Medicine

## 2014-04-21 NOTE — Telephone Encounter (Signed)
I agree that he needs to start O2 w exertion. Will order for him.

## 2014-04-21 NOTE — Telephone Encounter (Signed)
Spoke with Melissa at [facility] and she states qualifications are complete and she will call back if there is anything further needed

## 2014-04-21 NOTE — Telephone Encounter (Signed)
Pt aware of RB's rec's and order placed for portable o2 2L/PM Pt verbalized understanding and aware will receive a call to get set up on O2

## 2014-04-26 ENCOUNTER — Telehealth: Payer: Self-pay | Admitting: Emergency Medicine

## 2014-04-26 NOTE — Telephone Encounter (Signed)
Spoke with the pt  He is wondering why [facility] has not brought POC  They brought "a big tank on wheels" I see that the order was sent to [facility] for small portable system  Will forward to [facility] to figure out why they did not send the right tank

## 2014-04-27 NOTE — Telephone Encounter (Signed)
When a dme delivers a first time 02 set up they bring what is a basic set up so pt can have their 02 they will send a RT over to evaluate for the poc's within in [place]

## 2014-04-27 NOTE — Telephone Encounter (Signed)
Pt is aware of Libby's response. He agreed and verbalized understanding.

## 2014-04-28 ENCOUNTER — Telehealth: Payer: Self-pay | Admitting: Emergency Medicine

## 2014-04-28 DIAGNOSIS — R0600 Dyspnea, unspecified: Secondary | ICD-10-CM

## 2014-04-28 DIAGNOSIS — J449 Chronic obstructive pulmonary disease, unspecified: Secondary | ICD-10-CM

## 2014-04-28 NOTE — Telephone Encounter (Signed)
Melissa returned call.  Spoke with Melissa who stated that [facility] had gone to pt's home to evaluate pt for portable O2 concentrator.  Pt did not like what they have.  Per Lenna Sciara did mention that [facility] is supposed to be obtaining another POC this summer that is smaller than the [employer] or [employer] but this is not definite yet.  LMOM TCB x1 for pt discuss - what is he using now?  Would he like to try another DME?

## 2014-04-28 NOTE — Telephone Encounter (Signed)
LMTCB

## 2014-04-29 NOTE — Telephone Encounter (Signed)
Called and spoke with pt and he stated that [place] out and tried to give him a portable oxygen device with wheels.  He stated that he does not want this type of device.  He stated that [facility] told him this is what they had to offer him and he refused to have this, so he has no device at this time. Order has been sent to [facility] to send to [facility] for portable oxygen device.  Nothing further is needed.

## 2014-04-29 NOTE — Telephone Encounter (Signed)
LMTCB

## 2014-05-03 ENCOUNTER — Telehealth: Payer: Self-pay | Admitting: Emergency Medicine

## 2014-05-03 NOTE — Telephone Encounter (Signed)
Per the 4.29.15 phone note, pt's DME was changed from [facility] to [facility] because [place] did not have a model of POC that he wanted to use.    Called [facility] spoke with Maudie Mercury who reported that Iris faxed pt's info to his insurance [employer] on 4/30, but has yet to hear anything back.  Kim did mention that pt can call her at she would speak with him directly regarding this matter.  Called spoke with patient and informed him of the above.  Pt provided the number to [facility] ([phone]) and he stated that he would call [facility] directly.  Pt advised to call the office if there is anything further we can assist him with in this manner.    Will sign off.

## 2014-05-07 ENCOUNTER — Telehealth: Payer: Self-pay | Admitting: Emergency Medicine

## 2014-05-07 NOTE — Telephone Encounter (Signed)
Per phone note 05/03/14. Rinaldo Ratel, CMA at 05/03/2014 5:02 PM     Status: Signed        Per the 4.29.15 phone note, pt's DME was changed from [facility] to [facility] because [place] did not have a model of POC that he wanted to use.  Called [facility] spoke with Maudie Mercury who reported that Iris faxed pt's info to his insurance [employer] on 4/30, but has yet to hear anything back. Kim did mention that pt can call her at she would speak with him directly regarding this matter.  Called spoke with patient and informed him of the above. Pt provided the number to [facility] ([phone]) and he stated that he would call [facility] directly. Pt advised to call the office if there is anything further we can assist him with in this manner.  ---  I called and made pt aware of the above again. He did not call Kim as advised. He will do so Monday AM. Nothing further needed

## 2014-06-03 ENCOUNTER — Ambulatory Visit: Payer: Medicare Other | Admitting: Podiatry

## 2014-06-10 ENCOUNTER — Ambulatory Visit (INDEPENDENT_AMBULATORY_CARE_PROVIDER_SITE_OTHER): Payer: Medicare Other | Admitting: Podiatry

## 2014-06-10 DIAGNOSIS — B351 Tinea unguium: Secondary | ICD-10-CM

## 2014-06-10 DIAGNOSIS — M79609 Pain in unspecified limb: Secondary | ICD-10-CM

## 2014-06-10 NOTE — Progress Notes (Signed)
Subjective:     [name], male   DOB: [birth year], 80 y.o.   MRN: [id number]  HPI patient presents with the long gaited thick nails 1-5 of both feet   Review of Systems     Objective:   Physical Exam Neurovascular status intact with thick yellow brittle nailbeds 1-5 both feet that are painful    Assessment:     Mycotic nail infection with pain 1-5 both feet    Plan:     Debridement painful nailbeds 1-5 both feet with no bleeding noted

## 2014-06-14 ENCOUNTER — Encounter: Payer: Self-pay | Admitting: Emergency Medicine

## 2014-06-14 ENCOUNTER — Ambulatory Visit (INDEPENDENT_AMBULATORY_CARE_PROVIDER_SITE_OTHER): Payer: Medicare Other | Admitting: Emergency Medicine

## 2014-06-14 ENCOUNTER — Other Ambulatory Visit: Payer: Self-pay | Admitting: Emergency Medicine

## 2014-06-14 ENCOUNTER — Ambulatory Visit (INDEPENDENT_AMBULATORY_CARE_PROVIDER_SITE_OTHER)
Admission: RE | Admit: 2014-06-14 | Discharge: 2014-06-14 | Disposition: A | Discharge status: Home / Self Care | Payer: Medicare Other | Source: Ambulatory Visit | Attending: Emergency Medicine | Admitting: Emergency Medicine

## 2014-06-14 VITALS — BP 130/84 | HR 88 | Ht 70.5 in | Wt 196.0 lb

## 2014-06-14 DIAGNOSIS — J849 Interstitial pulmonary disease, unspecified: Secondary | ICD-10-CM

## 2014-06-14 DIAGNOSIS — J449 Chronic obstructive pulmonary disease, unspecified: Secondary | ICD-10-CM

## 2014-06-14 DIAGNOSIS — J8410 Pulmonary fibrosis, unspecified: Secondary | ICD-10-CM

## 2014-06-14 DIAGNOSIS — R079 Chest pain, unspecified: Secondary | ICD-10-CM
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
## 2014-06-14 NOTE — Assessment & Plan Note (Signed)
For last several weeks, getting better but persists. Sounds like MSK pain. He appropriately asks about possible PTX, etc.  - CXR today

## 2014-06-14 NOTE — Assessment & Plan Note (Signed)
We will perform auto-immune labs CXR today Consider empiric pred to see if the ILD is steroid responsive

## 2014-07-29 ENCOUNTER — Telehealth: Payer: Self-pay | Admitting: Emergency Medicine

## 2014-07-29 NOTE — Telephone Encounter (Signed)
Called spoke with pt. He had appt 06/14/14 w/ RB. We had an emergency in the office that day and appt was really never concluded. He wants to know what next step is. Please advise thanks

## 2014-07-31 ENCOUNTER — Encounter: Payer: Self-pay | Admitting: Cardiology

## 2014-08-02 NOTE — Telephone Encounter (Signed)
Pt called back. Made aware of RB recs. He will have lab work done. Pt scheduled appt for 09/09/14. Nothing further needed

## 2014-08-02 NOTE — Telephone Encounter (Signed)
I ordered auto-immune labs for him that day, but in all the confusion he didn';t go down to get them done. I still see the orders in the system. Let him know that IO would like for him to get the blood work and then we should decide whether to try treating him with anti-inflammatory meds to see if it affects his lung scarring. Make sure we have follow up to discuss.

## 2014-08-02 NOTE — Telephone Encounter (Signed)
lmomtcb x1 for pt 

## 2014-08-04 ENCOUNTER — Other Ambulatory Visit: Payer: Medicare Other

## 2014-08-04 DIAGNOSIS — J849 Interstitial pulmonary disease, unspecified: Secondary | ICD-10-CM

## 2014-08-04 LAB — RHEUMATOID FACTOR: Rhuematoid fact SerPl-aCnc: <10 IU/mL (ref <=14)

## 2014-08-05 LAB — SJOGREN'S SYNDROME ANTIBODS(SSA + SSB)
SSA (RO) (ENA) ANTIBODY, IGG: NEGATIVE
SSB (La) (ENA) Antibody, IgG: <1

## 2014-08-05 LAB — ANTI-SCLERODERMA ANTIBODY: SCLERODERMA (SCL-70) (ENA) ANTIBODY, IGG: NEGATIVE

## 2014-08-05 LAB — ANTI-DNA ANTIBODY, DOUBLE-STRANDED: ds DNA Ab: 1 IU/mL

## 2014-08-05 LAB — ANA: Anti Nuclear Antibody(ANA): NEGATIVE

## 2014-08-05 LAB — JO-1 ANTIBODY-IGG: JO-1 ANTIBODY, IGG: NEGATIVE

## 2014-09-09 ENCOUNTER — Ambulatory Visit: Payer: Medicare Other | Admitting: Emergency Medicine

## 2014-09-14 ENCOUNTER — Encounter: Payer: Self-pay | Admitting: Emergency Medicine

## 2014-09-14 ENCOUNTER — Ambulatory Visit (INDEPENDENT_AMBULATORY_CARE_PROVIDER_SITE_OTHER): Payer: Medicare Other | Admitting: Emergency Medicine

## 2014-09-14 VITALS — BP 140/70 | HR 74 | Temp 97.0°F | Ht 70.0 in | Wt 195.0 lb

## 2014-09-14 DIAGNOSIS — J849 Interstitial pulmonary disease, unspecified: Secondary | ICD-10-CM

## 2014-09-14 DIAGNOSIS — J449 Chronic obstructive pulmonary disease, unspecified: Secondary | ICD-10-CM

## 2014-09-14 DIAGNOSIS — J8410 Pulmonary fibrosis, unspecified: Secondary | ICD-10-CM

## 2014-09-14 NOTE — Patient Instructions (Signed)
We will not start any new medications at this time.  If you find a new oxygen provider that has a smaller system then we will be happy to send your testing and a prescription to them so you can pursue this.  You will need a repeat Ct scan of your chest in April 2016.  Follow with Dr Lamonte Sakai in 6 months or sooner if you have any problems
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
## 2014-09-14 NOTE — Assessment & Plan Note (Signed)
Mild bibasilar GG and UIP pattern.  No inflammatory markers positive.  Have not tried a steroid trial.

## 2014-09-14 NOTE — Assessment & Plan Note (Signed)
Not currently treated  - he did not see any benefit from Spiriva. We could consider starting an alternative in the future. For now he would like to defer. He is working on trying to find a more portable o2 system (he currently uses the smallest one [facility] provides)

## 2014-09-23 ENCOUNTER — Ambulatory Visit: Payer: Medicare Other | Admitting: Podiatry

## 2014-09-30 ENCOUNTER — Ambulatory Visit (INDEPENDENT_AMBULATORY_CARE_PROVIDER_SITE_OTHER): Payer: Medicare Other | Admitting: Podiatry

## 2014-09-30 ENCOUNTER — Encounter: Payer: Self-pay | Admitting: Podiatry

## 2014-09-30 DIAGNOSIS — B351 Tinea unguium: Secondary | ICD-10-CM

## 2014-09-30 DIAGNOSIS — M79673 Pain in unspecified foot: Secondary | ICD-10-CM

## 2014-09-30 NOTE — Progress Notes (Signed)
Subjective:     [name], male   DOB: 11-01-1930, 80 y.o.   MRN: [id number]  HPI patient presents with painful thick nailbeds 1-5 both feet that he cannot cut himself   Review of Systems     Objective:   Physical Exam Neurovascular status intact with thick yellow brittle nailbeds 1-5 both feet that are painful    Assessment:     Mycotic nail infection with pain 1-5 both feet    Plan:     Debride painful nailbeds 1-5 both feet with no iatrogenic bleeding noted

## 2014-11-05 ENCOUNTER — Other Ambulatory Visit: Payer: Self-pay | Admitting: Emergency Medicine

## 2014-11-05 ENCOUNTER — Telehealth: Payer: Self-pay | Admitting: Emergency Medicine

## 2014-11-05 DIAGNOSIS — J849 Interstitial pulmonary disease, unspecified: Secondary | ICD-10-CM

## 2014-11-05 DIAGNOSIS — J441 Chronic obstructive pulmonary disease with (acute) exacerbation: Secondary | ICD-10-CM

## 2014-11-05 NOTE — Telephone Encounter (Signed)
As stated below, patient needs to re-qualify and also needs a face-to-face in order to obtain O2.

## 2014-11-05 NOTE — Telephone Encounter (Signed)
Spoke to patient and he decided that he wanted to use the O[employer]go oxygen system 4.8lb.  I sent an order DME: [facility] for system.  Kim called back and said that in order for patient to be placed back on O2 equipment, he needs to be re-qualified.

## 2014-11-08 NOTE — Telephone Encounter (Signed)
OK please arrange for him to come in to be titrated with exertion

## 2014-11-08 NOTE — Telephone Encounter (Signed)
Holly, can you please schedule patient an appointment to come in to see Dr. Lamonte Sakai to re-qualify for O2.

## 2014-11-17 ENCOUNTER — Ambulatory Visit (INDEPENDENT_AMBULATORY_CARE_PROVIDER_SITE_OTHER): Payer: Medicare Other | Admitting: Emergency Medicine

## 2014-11-17 ENCOUNTER — Encounter: Payer: Self-pay | Admitting: Emergency Medicine

## 2014-11-17 VITALS — BP 152/70 | HR 95 | Ht 70.0 in | Wt 203.6 lb

## 2014-11-17 DIAGNOSIS — J849 Interstitial pulmonary disease, unspecified: Secondary | ICD-10-CM

## 2014-11-17 DIAGNOSIS — G4733 Obstructive sleep apnea (adult) (pediatric): Secondary | ICD-10-CM

## 2014-11-17 DIAGNOSIS — J449 Chronic obstructive pulmonary disease, unspecified: Secondary | ICD-10-CM
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
## 2014-11-17 NOTE — Assessment & Plan Note (Signed)
Continue CPAP every night. He has not needed oxygen bled into his device

## 2014-11-17 NOTE — Assessment & Plan Note (Signed)
Etiology unclear. He has not been treated with prednisone date. We are planning to repeat his CT scan in spring of 2016. Depending on these results we may decide to initiate a trial of anti-inflammatory medication.Marland Kitchen He needs to perform walking oximetry today so that he can get his specific portable oxygen  Concentrator. He has also requested a handicap sticker

## 2014-11-17 NOTE — Patient Instructions (Addendum)
We will repeat your CT scan of the chest in April 2016 We will perform walking oximetry today and then order your portable oxygen concentrator device. We will arrange for a handicap sticker today Follow with Dr Lamonte Sakai in April after your CT scan to review.

## 2014-11-17 NOTE — Assessment & Plan Note (Signed)
Currently untreated. He has not benefited in the past from bronchodilators. We may decide to retry these at some point in the future.

## 2014-11-29 ENCOUNTER — Telehealth: Payer: Self-pay | Admitting: Emergency Medicine

## 2014-11-29 NOTE — Telephone Encounter (Signed)
Called and spoke with Alan Ford from [facility] and she stated that the pt returned his POC about a month ago---he stated that he was going to order a 2 pound POC but this fell through----and he wanted to come back and pick up his POC.  Kim told him that when you turn in your POC that he would have to be retested and he told her that this was not true.  She wanted to call and give a heads up about the pt in case he calls.

## 2014-11-30 ENCOUNTER — Telehealth: Payer: Self-pay | Admitting: Emergency Medicine

## 2014-11-30 NOTE — Telephone Encounter (Signed)
Called [facility], spoke with Seth Bake Needing to verify that the patient is using O2 and needing OV notes and most updated information regarding the patients usage of O2 [phone]  Called and spoke with patient to verify that he is currently using O2 as directed. Patient gave permission to send his health records via fax to [facility]. Records have been faxed. Nothing further needed.

## 2014-12-08 ENCOUNTER — Telehealth: Payer: Self-pay | Admitting: Emergency Medicine

## 2014-12-08 NOTE — Telephone Encounter (Signed)
Spoke with pt-- pt wanting to make RB aware that he is not going to use [facility] as their equipment for O2 is too expensive. Pt has chosen to use [employer]. Pt aware to call us if anything further needed. We will await a phone call from [place].  Will send to [place] as FYI.

## 2014-12-08 NOTE — Telephone Encounter (Signed)
Spoke with pt-- pt wanting to make RB aware that he is not going to use [facility] as their equipment for O2 is too expensive. Pt has chosen to use [facility]. [employer] has faxed form for MW to fill out and sign. Please advise Sharyn Lull if this has been received.

## 2014-12-09 NOTE — Telephone Encounter (Signed)
Thanks

## 2014-12-09 NOTE — Telephone Encounter (Signed)
Rica Mote from [employer] calling to check on status of fax please advise he can be reached @ [phone].Hillery Hunter

## 2014-12-09 NOTE — Telephone Encounter (Signed)
Form has been received and placed in RB green look at folder. Please advise thanks

## 2014-12-14 NOTE — Telephone Encounter (Signed)
Form signed and faxed back to [phone] attn: Angelique Blonder and left detailed message with Rica Mote to make aware form faxed back. This has been placed to scan.  Nothing further needed.

## 2014-12-14 NOTE — Telephone Encounter (Signed)
Spoke with Rica Mote with [employer]--aware that Dr Lamonte Sakai has form for POC to review. Rica Mote states that the deadline for the form was last week. They have given the patient a one-week extension already and are needing this form back ASAP. Pt is trying to go out of town for the [place] and it needing a POC to travel with. [employer] cannot give he patient his machine until they have these forms returned and signed.   Please advise Dr Lamonte Sakai. Thanks.

## 2014-12-27 ENCOUNTER — Other Ambulatory Visit (HOSPITAL_COMMUNITY): Payer: Medicare Other

## 2014-12-27 ENCOUNTER — Ambulatory Visit: Payer: Medicare Other | Admitting: Family

## 2015-01-03 ENCOUNTER — Ambulatory Visit (INDEPENDENT_AMBULATORY_CARE_PROVIDER_SITE_OTHER): Payer: Medicare Other | Admitting: Podiatry

## 2015-01-03 DIAGNOSIS — M79673 Pain in unspecified foot: Secondary | ICD-10-CM

## 2015-01-03 DIAGNOSIS — B351 Tinea unguium: Secondary | ICD-10-CM

## 2015-01-03 NOTE — Progress Notes (Signed)
Subjective:     [name], male   DOB: [birth year], 81 y.o.   MRN: [id number]  HPI patient presents with painful thick nailbeds 1-5 both feet that he cannot cut himself   Review of Systems     Objective:   Physical Exam Neurovascular status intact with thick yellow brittle nailbeds 1-5 both feet that are painful    Assessment:     Mycotic nail infection with pain 1-5 both feet    Plan:     Debride painful nailbeds 1-5 both feet with no iatrogenic bleeding noted

## 2015-01-14 ENCOUNTER — Encounter: Payer: Self-pay | Admitting: Family

## 2015-01-17 ENCOUNTER — Ambulatory Visit (INDEPENDENT_AMBULATORY_CARE_PROVIDER_SITE_OTHER): Payer: Medicare Other | Admitting: Family

## 2015-01-17 ENCOUNTER — Encounter: Payer: Self-pay | Admitting: Family

## 2015-01-17 ENCOUNTER — Ambulatory Visit (HOSPITAL_COMMUNITY)
Admission: RE | Admit: 2015-01-17 | Discharge: 2015-01-17 | Disposition: A | Discharge status: Home / Self Care | Payer: Medicare Other | Source: Ambulatory Visit | Attending: Family | Admitting: Family

## 2015-01-17 VITALS — BP 137/75 | HR 85 | Resp 16 | Ht 70.5 in | Wt 188.0 lb

## 2015-01-17 DIAGNOSIS — I714 Abdominal aortic aneurysm, without rupture, unspecified: Secondary | ICD-10-CM

## 2015-01-17 DIAGNOSIS — N189 Chronic kidney disease, unspecified: Secondary | ICD-10-CM

## 2015-01-17 DIAGNOSIS — Z87891 Personal history of nicotine dependence: Secondary | ICD-10-CM | POA: Diagnosis not present

## 2015-01-17 NOTE — Patient Instructions (Signed)
Abdominal Aortic Aneurysm An aneurysm is a weakened or damaged part of an artery wall that bulges from the normal force of blood pumping through the body. An abdominal aortic aneurysm is an aneurysm that occurs in the lower part of the aorta, the main artery of the body.  The major concern with an abdominal aortic aneurysm is that it can enlarge and burst (rupture) or blood can flow between the layers of the wall of the aorta through a tear (aorticdissection). Both of these conditions can cause bleeding inside the body and can be life threatening unless diagnosed and treated promptly. CAUSES  The exact cause of an abdominal aortic aneurysm is unknown. Some contributing factors are:   A hardening of the arteries caused by the buildup of fat and other substances in the lining of a blood vessel (arteriosclerosis).  Inflammation of the walls of an artery (arteritis).   Connective tissue diseases, such as Marfan syndrome.   Abdominal trauma.   An infection, such as syphilis or staphylococcus, in the wall of the aorta (infectious aortitis) caused by bacteria. RISK FACTORS  Risk factors that contribute to an abdominal aortic aneurysm may include:  Age older than 60 years.   High blood pressure (hypertension).  Male gender.  Ethnicity (white race).  Obesity.  Family history of aneurysm (first degree relatives only).  Tobacco use. PREVENTION  The following healthy lifestyle habits may help decrease your risk of abdominal aortic aneurysm:  Quitting smoking. Smoking can raise your blood pressure and cause arteriosclerosis.  Limiting or avoiding alcohol.  Keeping your blood pressure, blood sugar level, and cholesterol levels within normal limits.  Decreasing your salt intake. In somepeople, too much salt can raise blood pressure and increase your risk of abdominal aortic aneurysm.  Eating a diet low in saturated fats and cholesterol.  Increasing your fiber intake by including  whole grains, vegetables, and fruits in your diet. Eating these foods may help lower blood pressure.  Maintaining a healthy weight.  Staying physically active and exercising regularly. SYMPTOMS  The symptoms of abdominal aortic aneurysm may vary depending on the size and rate of growth of the aneurysm.Most grow slowly and do not have any symptoms. When symptoms do occur, they may include:  Pain (abdomen, side, lower back, or groin). The pain may vary in intensity. A sudden onset of severe pain may indicate that the aneurysm has ruptured.  Feeling full after eating only small amounts of food.  Nausea or vomiting or both.  Feeling a pulsating lump in the abdomen.  Feeling faint or passing out. DIAGNOSIS  Since most unruptured abdominal aortic aneurysms have no symptoms, they are often discovered during diagnostic exams for other conditions. An aneurysm may be found during the following procedures:  Ultrasonography (A one-time screening for abdominal aortic aneurysm by ultrasonography is also recommended for all men aged 65-75 years who have ever smoked).  X-ray exams.  A computed tomography (CT).  Magnetic resonance imaging (MRI).  Angiography or arteriography. TREATMENT  Treatment of an abdominal aortic aneurysm depends on the size of your aneurysm, your age, and risk factors for rupture. Medication to control blood pressure and pain may be used to manage aneurysms smaller than 6 cm. Regular monitoring for enlargement may be recommended by your caregiver if:  The aneurysm is 3-4 cm in size (an annual ultrasonography may be recommended).  The aneurysm is 4-4.5 cm in size (an ultrasonography every 6 months may be recommended).  The aneurysm is larger than 4.5 cm in   size (your caregiver may ask that you be examined by a vascular surgeon). If your aneurysm is larger than 6 cm, surgical repair may be recommended. There are two main methods for repair of an aneurysm:   Endovascular  repair (a minimally invasive surgery). This is done most often.  Open repair. This method is used if an endovascular repair is not possible. Document Released: 09/26/2005 Document Revised: 04/13/2013 Document Reviewed: 01/16/2013 ExitCare Patient Information 2015 [employer]. This information is not intended to replace advice given to you by your health care provider. Make sure you discuss any questions you have with your health care provider.  
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
## 2015-04-04 ENCOUNTER — Ambulatory Visit (INDEPENDENT_AMBULATORY_CARE_PROVIDER_SITE_OTHER): Payer: Medicare Other | Admitting: Podiatry

## 2015-04-04 DIAGNOSIS — B351 Tinea unguium: Secondary | ICD-10-CM

## 2015-04-04 DIAGNOSIS — M79673 Pain in unspecified foot: Secondary | ICD-10-CM | POA: Diagnosis not present

## 2015-04-04 NOTE — Progress Notes (Signed)
Subjective:     [name], male   DOB: 1930-06-18, 81 y.o.   MRN: [id number]  HPI patient presents with painful thick nailbeds 1-5 both feet that he cannot cut himself   Review of Systems     Objective:   Physical Exam Neurovascular status intact with thick yellow brittle nailbeds 1-5 both feet that are painful    Assessment:     Mycotic nail infection with pain 1-5 both feet    Plan:     Debride painful nailbeds 1-5 both feet with no iatrogenic bleeding noted

## 2015-05-10 ENCOUNTER — Ambulatory Visit (INDEPENDENT_AMBULATORY_CARE_PROVIDER_SITE_OTHER): Payer: Medicare Other | Admitting: Emergency Medicine

## 2015-05-10 ENCOUNTER — Encounter: Payer: Self-pay | Admitting: Emergency Medicine

## 2015-05-10 VITALS — BP 140/80 | HR 94 | Ht 70.0 in | Wt 192.0 lb

## 2015-05-10 DIAGNOSIS — J849 Interstitial pulmonary disease, unspecified: Secondary | ICD-10-CM

## 2015-05-10 DIAGNOSIS — G4733 Obstructive sleep apnea (adult) (pediatric): Secondary | ICD-10-CM | POA: Diagnosis not present

## 2015-05-10 DIAGNOSIS — J449 Chronic obstructive pulmonary disease, unspecified: Secondary | ICD-10-CM | POA: Diagnosis not present

## 2015-05-10 NOTE — Assessment & Plan Note (Signed)
He has not responded to bronchodilators in the past but I question whether this was because he was hypoxemic. Now he is treated with oxygen and would like to retry Spiriva daily to see if he benefits

## 2015-05-10 NOTE — Patient Instructions (Signed)
Please start Spiriva 2 inhalations once a day until next visit We will perform a high-resolution CT scan of your chest to compare with priors We will place an order with [facility] to obtain your oxygen tubing We will attempt to place a claim through [facility] and Medicare for your portable oxygen concentrator Follow with Dr Lamonte Sakai in 1 month

## 2015-05-10 NOTE — Assessment & Plan Note (Signed)
He needs a repeat CT scan of his chest now to compare with prior. We may decide to start prednisone trial at some point to see if he benefits depending on his CT results

## 2015-05-10 NOTE — Assessment & Plan Note (Signed)
We will continue his CPAP every night
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
## 2015-05-16 ENCOUNTER — Telehealth: Payer: Self-pay | Admitting: Emergency Medicine

## 2015-05-16 NOTE — Telephone Encounter (Signed)
Samples have been left at the front desk for pick up. Pt is aware. Nothing further was needed.

## 2015-06-08 ENCOUNTER — Ambulatory Visit (INDEPENDENT_AMBULATORY_CARE_PROVIDER_SITE_OTHER)
Admission: RE | Admit: 2015-06-08 | Discharge: 2015-06-08 | Disposition: A | Discharge status: Home / Self Care | Payer: Medicare Other | Source: Ambulatory Visit | Attending: Emergency Medicine | Admitting: Emergency Medicine

## 2015-06-08 DIAGNOSIS — J849 Interstitial pulmonary disease, unspecified: Secondary | ICD-10-CM | POA: Diagnosis not present

## 2015-06-14 ENCOUNTER — Encounter: Payer: Self-pay | Admitting: Emergency Medicine

## 2015-06-14 ENCOUNTER — Ambulatory Visit (INDEPENDENT_AMBULATORY_CARE_PROVIDER_SITE_OTHER): Payer: Medicare Other | Admitting: Emergency Medicine

## 2015-06-14 VITALS — BP 124/80 | HR 101 | Ht 70.0 in | Wt 198.0 lb

## 2015-06-14 DIAGNOSIS — J449 Chronic obstructive pulmonary disease, unspecified: Secondary | ICD-10-CM

## 2015-06-14 DIAGNOSIS — J849 Interstitial pulmonary disease, unspecified: Secondary | ICD-10-CM | POA: Diagnosis not present

## 2015-06-14 MED ORDER — TIOTROPIUM BROMIDE MONOHYDRATE 1.25 MCG/ACT IN AERS: 2.0000 | INHALATION_SPRAY | Freq: Every day | RESPIRATORY_TRACT | Status: DC

## 2015-06-14 NOTE — Assessment & Plan Note (Signed)
Stable by CT scan performed on 06/08/15. I believe this is most consistent with NSIP. I'll not plan for any scheduled follow-up scans at this time

## 2015-06-14 NOTE — Assessment & Plan Note (Signed)
I will like to continue his oxygen with all exertion. We will inquire as to whether his portal oxygen concentrator can be covered by Medicare. I will continue Spiriva 2 puffs twice a day.
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
## 2015-06-14 NOTE — Patient Instructions (Signed)
Please continue Spiriva 2 puffs once a day Continue to use your oxygen with all exertion CT scan of the chest has been stable for several years. There has been no significant progression of the interstitial scarring Follow with Dr Lamonte Sakai in 6 months or sooner if you have any problems

## 2015-06-27 ENCOUNTER — Other Ambulatory Visit: Payer: Self-pay

## 2015-08-01 ENCOUNTER — Ambulatory Visit: Payer: Medicare Other

## 2015-08-30 ENCOUNTER — Telehealth: Payer: Self-pay | Admitting: Hematology

## 2015-08-30 NOTE — Telephone Encounter (Signed)
Called pt to schedule new patient appointment.  Left message.  Dx: Monoclonal gammopathy Referring: Dr. Felipa Eth

## 2015-09-13 ENCOUNTER — Telehealth: Payer: Self-pay | Admitting: Hematology

## 2015-09-13 ENCOUNTER — Other Ambulatory Visit (HOSPITAL_BASED_OUTPATIENT_CLINIC_OR_DEPARTMENT_OTHER): Payer: Medicare Other

## 2015-09-13 ENCOUNTER — Encounter: Payer: Self-pay | Admitting: Hematology

## 2015-09-13 ENCOUNTER — Ambulatory Visit (HOSPITAL_BASED_OUTPATIENT_CLINIC_OR_DEPARTMENT_OTHER): Payer: Medicare Other | Admitting: Hematology

## 2015-09-13 VITALS — BP 160/71 | HR 82 | Resp 18 | Ht 70.0 in | Wt 190.1 lb

## 2015-09-13 DIAGNOSIS — I509 Heart failure, unspecified: Secondary | ICD-10-CM

## 2015-09-13 DIAGNOSIS — R5383 Other fatigue: Secondary | ICD-10-CM | POA: Diagnosis not present

## 2015-09-13 DIAGNOSIS — D472 Monoclonal gammopathy: Secondary | ICD-10-CM

## 2015-09-13 LAB — CBC & DIFF AND RETIC
BASO%: 0.5 % (ref 0.0–2.0)
Basophils Absolute: 0.1 10*3/uL (ref 0.0–0.1)
EOS%: 3.3 % (ref 0.0–7.0)
Eosinophils Absolute: 0.3 10*3/uL (ref 0.0–0.5)
HCT: 36.8 % — ABNORMAL LOW (ref 38.4–49.9)
HGB: 12.4 g/dL — ABNORMAL LOW (ref 13.0–17.1)
Immature Retic Fract: 15.2 % — ABNORMAL HIGH (ref 3.00–10.60)
LYMPH#: 3.1 10*3/uL (ref 0.9–3.3)
LYMPH%: 33.4 % (ref 14.0–49.0)
MCH: 32.9 pg (ref 27.2–33.4)
MCHC: 33.7 g/dL (ref 32.0–36.0)
MCV: 97.6 fL (ref 79.3–98.0)
MONO#: 0.8 10*3/uL (ref 0.1–0.9)
MONO%: 8.2 % (ref 0.0–14.0)
NEUT%: 54.6 % (ref 39.0–75.0)
NEUTROS ABS: 5.1 10*3/uL (ref 1.5–6.5)
Platelets: 216 10*3/uL (ref 140–400)
RBC: 3.77 10*6/uL — AB (ref 4.20–5.82)
RDW: 14.4 % (ref 11.0–14.6)
RETIC CT ABS: 114.23 10*3/uL — AB (ref 34.80–93.90)
Retic %: 3.03 % — ABNORMAL HIGH (ref 0.80–1.80)
WBC: 9.3 10*3/uL (ref 4.0–10.3)

## 2015-09-13 LAB — COMPREHENSIVE METABOLIC PANEL (CC13)
ALT: 17 U/L (ref 0–55)
AST: 19 U/L (ref 5–34)
Albumin: 3.6 g/dL (ref 3.5–5.0)
Alkaline Phosphatase: 100 U/L (ref 40–150)
Anion Gap: 9 mEq/L (ref 3–11)
BUN: 24.3 mg/dL (ref 7.0–26.0)
CO2: 22 meq/L (ref 22–29)
Calcium: 9.7 mg/dL (ref 8.4–10.4)
Chloride: 111 mEq/L — ABNORMAL HIGH (ref 98–109)
Creatinine: 2.1 mg/dL — ABNORMAL HIGH (ref 0.7–1.3)
EGFR: 28 mL/min/{1.73_m2} — ABNORMAL LOW (ref >90)
Glucose: 157 mg/dl — ABNORMAL HIGH (ref 70–140)
Potassium: 5.5 mEq/L — ABNORMAL HIGH (ref 3.5–5.1)
Sodium: 142 mEq/L (ref 136–145)
Total Bilirubin: 0.25 mg/dL (ref 0.20–1.20)
Total Protein: 7.9 g/dL (ref 6.4–8.3)

## 2015-09-13 LAB — LACTATE DEHYDROGENASE (CC13): LDH: 240 U/L (ref 125–245)

## 2015-09-13 NOTE — Telephone Encounter (Signed)
Pt confirmed labs/ov per 09/13 POF, gave pt AVS and Calendar.... KJ °

## 2015-09-13 NOTE — Progress Notes (Signed)
[facility]    HEMATOLOGY/ONCOLOGY CONSULTATION NOTE  Date of Service: 09/13/2015  Patient Care Team: Lajean Manes, MD as PCP - General (Internal Medicine)  CHIEF COMPLAINTS/PURPOSE OF CONSULTATION:  IgM gammopathy of undetermined significance  HISTORY OF PRESENTING ILLNESS:  Alan Ford is a wonderful 81 y.o. male who has been referred to [place] by Dr Merryl Hacker for evaluation and management of IgM monoclonal gammopathy of undetermined significance.  Patient has a history of hypertension, diabetes, dyslipidemia, CHF, COPD, chronic kidney disease, sleep apnea on CPAP and previous history of alcohol abuse who apparently had a SPEP, IFE, UPEP  done as a part of the workup for neuropathy. He was noted to have an M spike of 0.3 g/dL which on IFE was noted to be IgM with an IgM level of 382.  UPEP did not show any M spike.  Total protein in the urine was 506.9 mg/dL.  He was referred to [place] for further evaluation of his IgM monoclonal gammopathy of undetermined significance. No change in vision.  No headaches.  No new external lymph node enlargement.  No other acute new focal symptoms.no chest pain.  No shortness of breath. Increased fatigue noted by the patient for the last few months.  He has chronic neuropathy that has been activated for diabetes.    MEDICAL HISTORY:  Past Medical History  Diagnosis Date  . Diabetes mellitus   . High blood pressure   . High cholesterol   . CHF (congestive heart failure)   . Emphysema   . Joint pain   . AAA (abdominal aortic aneurysm)   . COPD (chronic obstructive pulmonary disease)   . Chronic kidney disease   . Irregular heart beat   . PVC (premature ventricular contraction)   . Shingles   . Chronic renal disease, stage III   . Sleep apnea     cpap  . Irregular heart beat   . CAD (coronary artery disease)   . Chronic diastolic heart failure 2/69/4854  . Atrial fibrillation     SURGICAL HISTORY: Past Surgical History  Procedure Laterality Date  .  Penile prosthesis implant  09-2005  . Tonsillectomy    . Urethrotomy      SOCIAL HISTORY: Social History   Social History  . Marital Status: Married    Spouse Name: N/A  . Number of Children: 2  . Years of Education: N/A   Occupational History  . mental health    Social History Main Topics  . Smoking status: Former Smoker -- 3.00 packs/day for 30 years    Types: Cigarettes    Quit date: 12/31/1985  . Smokeless tobacco: Former Systems developer    Types: Chew     Comment: "little chewing tobacco"  . Alcohol Use: No     Comment: quit in 1985  . Drug Use: No  . Sexual Activity: Not on file   Other Topics Concern  . Not on file   Social History Narrative    FAMILY HISTORY: Family History  Problem Relation Age of Onset  . Other Father     AAA  . Heart disease Father     After age 70,   AAA  . Hypertension Father   . Hyperlipidemia Father   . Heart attack Father   . Diabetes Mother     amputation  . Heart disease Mother   . Hypertension Mother   . Hyperlipidemia Mother   . Deep vein thrombosis Mother   . Heart attack Mother   . Diabetes  Sister   . Heart disease Sister     Heart Disease before age 26  . Hypertension Sister   . Hyperlipidemia Sister   . Diabetes Brother   . Heart disease Brother   . Hypertension Brother   . Hyperlipidemia Brother   . Cancer Daughter     ALLERGIES:  has No Known Allergies.  MEDICATIONS:  Current Outpatient Prescriptions  Medication Sig Dispense Refill  . AMLODIPINE BESYLATE PO Take 1 tablet by mouth daily.     . Ascorbic Acid (VITAMIN C) 1000 MG tablet Take 1,000 mg by mouth daily.    Marland Kitchen aspirin EC 81 MG tablet Take 81 mg by mouth at bedtime.     Marland Kitchen BAYER CONTOUR TEST test strip     . carvedilol (COREG) 3.125 MG tablet Take 3.125 mg by mouth 2 (two) times daily with a meal.    . Chromium Picolinate 800 MCG TABS Take 2 tablets by mouth daily.      . Coenzyme Q10 (CO Q-10) 100 MG CAPS Take 100 mg by mouth at bedtime.     Marland Kitchen glipiZIDE  (GLUCOTROL XL) 10 MG 24 hr tablet Take 10 mg by mouth at bedtime.    Marland Kitchen glipiZIDE (GLUCOTROL XL) 2.5 MG 24 hr tablet Take 2.5 mg by mouth daily.    Marland Kitchen HYDROcodone-acetaminophen (NORCO/VICODIN) 5-325 MG per tablet 1 tablet as needed.    . metFORMIN (GLUCOPHAGE) 1000 MG tablet Take 1,000 mg by mouth 2 (two) times daily with a meal.    . simvastatin (ZOCOR) 20 MG tablet Take 20 mg by mouth at bedtime.     . Tamsulosin HCl (FLOMAX) 0.4 MG CAPS Take 0.4 mg by mouth at bedtime.     . Tiotropium Bromide Monohydrate (SPIRIVA RESPIMAT) 1.25 MCG/ACT AERS Inhale 2 puffs into the lungs daily. 1 Inhaler 5  . zinc gluconate 50 MG tablet Take 50 mg by mouth at bedtime.      No current facility-administered medications for this visit.    REVIEW OF SYSTEMS:    10 Point review of Systems was done is negative except as noted above.  PHYSICAL EXAMINATION: ECOG PERFORMANCE STATUS: 1 - Symptomatic but completely ambulatory  . Filed Vitals:   09/13/15 1455  Height: '5\' 10"'$  (1.778 m)  Weight: 190 lb 1.6 oz (86.229 kg)   Filed Weights   09/13/15 1455  Weight: 190 lb 1.6 oz (86.229 kg)   .Body mass index is 27.28 kg/(m^2).  GENERAL:alert, in no acute distress and comfortable SKIN: no acute rashes EYES: normal, conjunctiva are pink and non-injected, sclera clear OROPHARYNX:no exudate, no erythema and lips, buccal mucosa, and tongue normal  NECK: supple, no JVD, thyroid normal size,No palpable adenopathy LYMPH:  no palpable lymphadenopathy in the cervical, axillary or inguinal LUNGS: clear to auscultation with normal respiratory effort HEART: regular rate & rhythm ABDOMEN: abdomen soft, non-tender, normoactive bowel sounds no overt hepatosplenomegaly of palpable Musculoskeletal: no cyanosis of digits and no clubbing  PSYCH: alert & oriented x 3 with fluent speech NEURO: no focal motor/sensory deficits  LABORATORY DATA:  I have reviewed the data as listed  . CBC Latest Ref Rng 09/13/2015 10/10/2009  10/09/2009  WBC 4.0 - 10.3 10e3/uL 9.3 9.1 8.6  Hemoglobin 13.0 - 17.1 g/dL 12.4(L) 13.3 13.6  Hematocrit 38.4 - 49.9 % 36.8(L) 39.7 39.9  Platelets 140 - 400 10e3/uL 216 231 232    . CMP Latest Ref Rng 09/13/2015 10/10/2009 10/09/2009  Glucose 70 - 140 mg/dl 157(H) 160(H) 155(H)  BUN 7.0 - 26.0 mg/dL 24.3 42(H) 30(H)  Creatinine 0.7 - 1.3 mg/dL 2.1(H) 1.23 1.00  Sodium 136 - 145 mEq/L 142 140 141  Potassium 3.5 - 5.1 mEq/L 5.5(H) 4.4 3.8  Chloride 96 - 112 mEq/L - 105 100  CO2 22 - 29 mEq/L '22 25 24  '$ Calcium 8.4 - 10.4 mg/dL 9.7 9.2 8.5  Total Protein 6.4 - 8.3 g/dL 7.9 - -  Total Bilirubin 0.20 - 1.20 mg/dL 0.25 - -  Alkaline Phos 40 - 150 U/L 100 - -  AST 5 - 34 U/L 19 - -  ALT 0 - 55 U/L 17 - -   . Lab Results  Component Value Date   TOTALPROTELP 7.2 09/13/2015   ALBUMINELP 3.6* 09/13/2015   A1GS 0.4* 09/13/2015   A2GS 1.4* 09/13/2015   BETS 0.4 09/13/2015   BETA2SER 0.5 09/13/2015   GAMS 0.9 09/13/2015   SPEI * 09/13/2015  Monoclonal IgM kappa protein is present.Reviewed by Odis Hollingshead, MD, PhD, FCAP (Electronic Signature onFile)  Lab Results  Component Value Date   KPAFRELGTCHN 6.14* 09/13/2015   LAMBDASER 2.32 09/13/2015   KAPLAMBRATIO 2.65* 09/13/2015   (kappa/lambda light chains)   . Lab Results  Component Value Date   LDH 240 09/13/2015      RADIOGRAPHIC STUDIES: I have personally reviewed the radiological images as listed and agreed with the findings in the report. No results found.  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 No overt hyperviscosity symptoms, anemia, lymphadenopathy or hepatosplenomegaly.  LDH within normal limits Plan -We'll get a PET/CT scan which has been scheduled for 09/27/2015 -We'll discuss with the patient about possibly considering a bone marrow aspiration and biopsy versus lymph node biopsy if enlarged lymph  node noted on PET/CT scan.  #2 hypertension #3 dyslipidemia #4 diabetes type 2 #5 acute on chronic kidney disease creatinine 2.1 related to ?urinary obstruction from BPH. Less likely from IgM monoclonal protein. Likely baseline CKD from HTN/DM2. #6 Hyperkalemia - ? Due to urinary obstruction vs CKD  potassium 5.5 Plan -encourage patient to increase fluids po -rpt labs on 9/26 to monitor renal function and potassium -decrease potassium intake. -avoid other nephrotoxins.   All of the patients questions were answered to his apparent satisfaction. The patient knows to call the clinic with any problems, questions or concerns.  I spent 40 minutes counseling the patient face to face. The total time spent in the appointment was 60 minutes and more than 50% was on counseling and direct patient cares.    Sullivan Lone MD [place] AAHIVMS [facility] Hematology/Oncology Physician [facility]  (Office):       [phone] (Work cell):  [phone] (Fax):           [phone]  09/13/2015 3:01 PM

## 2015-09-14 LAB — FERRITIN CHCC: Ferritin: 58 ng/ml (ref 22–316)

## 2015-09-14 LAB — IRON AND TIBC CHCC
%SAT: 20 % (ref 20–55)
Iron: 48 ug/dL (ref 42–163)
TIBC: 236 ug/dL (ref 202–409)
UIBC: 188 ug/dL (ref 117–376)

## 2015-09-15 LAB — SPEP & IFE WITH QIG
ALBUMIN ELP: 3.6 g/dL — AB (ref 3.8–4.8)
Abnormal Protein Band1: 0.3 g/dL
Alpha-1-Globulin: 0.4 g/dL — ABNORMAL HIGH (ref 0.2–0.3)
Alpha-2-Globulin: 1.4 g/dL — ABNORMAL HIGH (ref 0.5–0.9)
BETA 2: 0.5 g/dL (ref 0.2–0.5)
Beta Globulin: 0.4 g/dL (ref 0.4–0.6)
Gamma Globulin: 0.9 g/dL (ref 0.8–1.7)
IGA: 247 mg/dL (ref 68–379)
IGG (IMMUNOGLOBIN G), SERUM: 819 mg/dL (ref 650–1600)
IGM, SERUM: 439 mg/dL — AB (ref 41–251)
TOTAL PROTEIN, SERUM ELECTROPHOR: 7.2 g/dL (ref 6.1–8.1)

## 2015-09-15 LAB — SEDIMENTATION RATE: Sed Rate: 115 mm/hr — ABNORMAL HIGH (ref 0–20)

## 2015-09-15 LAB — KAPPA/LAMBDA LIGHT CHAINS
KAPPA FREE LGHT CHN: 6.14 mg/dL — AB (ref 0.33–1.94)
KAPPA LAMBDA RATIO: 2.65 — AB (ref 0.26–1.65)
Lambda Free Lght Chn: 2.32 mg/dL (ref 0.57–2.63)

## 2015-09-15 LAB — BETA 2 MICROGLOBULIN, SERUM: BETA 2 MICROGLOBULIN: 5.78 mg/L — AB (ref <=2.51)

## 2015-09-26 ENCOUNTER — Telehealth: Payer: Self-pay | Admitting: *Deleted

## 2015-09-26 ENCOUNTER — Ambulatory Visit (HOSPITAL_BASED_OUTPATIENT_CLINIC_OR_DEPARTMENT_OTHER): Payer: Medicare Other

## 2015-09-26 DIAGNOSIS — D472 Monoclonal gammopathy: Secondary | ICD-10-CM | POA: Diagnosis not present

## 2015-09-26 LAB — CBC & DIFF AND RETIC
BASO%: 0.6 % (ref 0.0–2.0)
Basophils Absolute: 0.1 10*3/uL (ref 0.0–0.1)
EOS%: 4.2 % (ref 0.0–7.0)
Eosinophils Absolute: 0.4 10*3/uL (ref 0.0–0.5)
HCT: 36 % — ABNORMAL LOW (ref 38.4–49.9)
HGB: 12 g/dL — ABNORMAL LOW (ref 13.0–17.1)
IMMATURE RETIC FRACT: 14.5 % — AB (ref 3.00–10.60)
LYMPH%: 32.8 % (ref 14.0–49.0)
MCH: 32.6 pg (ref 27.2–33.4)
MCHC: 33.3 g/dL (ref 32.0–36.0)
MCV: 97.8 fL (ref 79.3–98.0)
MONO#: 0.6 10*3/uL (ref 0.1–0.9)
MONO%: 7.5 % (ref 0.0–14.0)
NEUT%: 54.9 % (ref 39.0–75.0)
NEUTROS ABS: 4.7 10*3/uL (ref 1.5–6.5)
Platelets: 186 10*3/uL (ref 140–400)
RBC: 3.68 10*6/uL — AB (ref 4.20–5.82)
RDW: 14.4 % (ref 11.0–14.6)
Retic %: 2.84 % — ABNORMAL HIGH (ref 0.80–1.80)
Retic Ct Abs: 104.51 10*3/uL — ABNORMAL HIGH (ref 34.80–93.90)
WBC: 8.6 10*3/uL (ref 4.0–10.3)
lymph#: 2.8 10*3/uL (ref 0.9–3.3)

## 2015-09-26 LAB — COMPREHENSIVE METABOLIC PANEL (CC13)
ALT: 17 U/L (ref 0–55)
ANION GAP: 8 meq/L (ref 3–11)
AST: 19 U/L (ref 5–34)
Albumin: 3.5 g/dL (ref 3.5–5.0)
Alkaline Phosphatase: 88 U/L (ref 40–150)
BUN: 18.4 mg/dL (ref 7.0–26.0)
CHLORIDE: 115 meq/L — AB (ref 98–109)
CO2: 18 meq/L — AB (ref 22–29)
Calcium: 9.3 mg/dL (ref 8.4–10.4)
Creatinine: 1.9 mg/dL — ABNORMAL HIGH (ref 0.7–1.3)
EGFR: 32 mL/min/{1.73_m2} — ABNORMAL LOW (ref >90)
GLUCOSE: 116 mg/dL (ref 70–140)
Potassium: 5 mEq/L (ref 3.5–5.1)
SODIUM: 142 meq/L (ref 136–145)
TOTAL PROTEIN: 7.4 g/dL (ref 6.4–8.3)
Total Bilirubin: <0.3 mg/dL (ref 0.20–1.20)

## 2015-09-26 NOTE — Telephone Encounter (Signed)
Increased K and Creatinine. Patient needs to come in for repeat of labs. Labs placed and POF sent to scheduler

## 2015-09-27 ENCOUNTER — Ambulatory Visit (HOSPITAL_COMMUNITY)
Admission: RE | Admit: 2015-09-27 | Discharge: 2015-09-27 | Disposition: A | Discharge status: Home / Self Care | Payer: Medicare Other | Source: Ambulatory Visit | Attending: Hematology | Admitting: Hematology

## 2015-09-27 DIAGNOSIS — D472 Monoclonal gammopathy: Secondary | ICD-10-CM | POA: Diagnosis not present

## 2015-09-27 DIAGNOSIS — J432 Centrilobular emphysema: Secondary | ICD-10-CM | POA: Diagnosis not present

## 2015-09-27 DIAGNOSIS — I714 Abdominal aortic aneurysm, without rupture: Secondary | ICD-10-CM | POA: Insufficient documentation

## 2015-09-27 LAB — GLUCOSE, CAPILLARY: GLUCOSE-CAPILLARY: 135 mg/dL — AB (ref 65–99)

## 2015-09-27 MED ORDER — FLUDEOXYGLUCOSE F - 18 (FDG) INJECTION
9.4600 | Freq: Once | INTRAVENOUS | Status: DC | PRN
  Administered 2015-09-27: 9.46 via INTRAVENOUS
  Filled 2015-09-27: qty 9.46

## 2015-10-06 ENCOUNTER — Other Ambulatory Visit: Payer: Self-pay | Admitting: *Deleted

## 2015-10-11 ENCOUNTER — Telehealth: Payer: Self-pay | Admitting: Hematology

## 2015-10-11 NOTE — Telephone Encounter (Signed)
Marland Kitchen CBC Latest Ref Rng 09/26/2015 09/13/2015 10/10/2009  WBC 4.0 - 10.3 10e3/uL 8.6 9.3 9.1  Hemoglobin 13.0 - 17.1 g/dL 12.0(L) 12.4(L) 13.3  Hematocrit 38.4 - 49.9 % 36.0(L) 36.8(L) 39.7  Platelets 140 - 400 10e3/uL 186 216 231    . CMP Latest Ref Rng 09/26/2015 09/13/2015 10/10/2009  Glucose 70 - 140 mg/dl 116 157(H) 160(H)  BUN 7.0 - 26.0 mg/dL 18.4 24.3 42(H)  Creatinine 0.7 - 1.3 mg/dL 1.9(H) 2.1(H) 1.23  Sodium 136 - 145 mEq/L 142 142 140  Potassium 3.5 - 5.1 mEq/L 5.0 5.5(H) 4.4  Chloride 96 - 112 mEq/L - - 105  CO2 22 - 29 mEq/L 18(L) 22 25  Calcium 8.4 - 10.4 mg/dL 9.3 9.7 9.2  Total Protein 6.4 - 8.3 g/dL 7.4 7.9 -  Total Bilirubin 0.20 - 1.20 mg/dL <0.30 0.25 -  Alkaline Phos 40 - 150 U/L 88 100 -  AST 5 - 34 U/L 19 19 -  ALT 0 - 55 U/L 17 17 -    . Lab Results  Component Value Date   TOTALPROTELP 7.2 09/13/2015   ALBUMINELP 3.6* 09/13/2015   A1GS 0.4* 09/13/2015   A2GS 1.4* 09/13/2015   BETS 0.4 09/13/2015   BETA2SER 0.5 09/13/2015   GAMS 0.9 09/13/2015   SPEI * 09/13/2015   Comments: Monoclonal IgM kappa protein is present.Reviewed by Odis Hollingshead, MD, PhD, FCAP   Comments: The monoclonal protein peak accounts for g/dL of the total g/dL ofprotein in the gamma region. The monoclonal protein peak accounts for 0.3 g/dL of the total 0.9 g/dL of protein in the gamma region. Reviewed by Odis Hollingshead, MD, PhD, FCAP   Lab Results  Component Value Date   KPAFRELGTCHN 6.14* 09/13/2015   LAMBDASER 2.32 09/13/2015   KAPLAMBRATIO 2.65* 09/13/2015   (kappa/lambda light chains)  UPEP M spike not observed based on Outside labs  . Lab Results  Component Value Date   LDH 240 09/13/2015   . Lab Results  Component Value Date   IRON 48 09/13/2015   TIBC 236 09/13/2015   IRONPCTSAT 20 09/13/2015   (Iron and TIBC)  Lab Results  Component Value Date   FERRITIN 58 09/13/2015      EXAM: NUCLEAR MEDICINE PET SKULL BASE TO THIGH  TECHNIQUE: 9.5  mCi F-18 FDG was injected intravenously. Full-ring PET imaging was performed from the skull base to thigh after the radiotracer. CT data was obtained and used for attenuation correction and anatomic localization.  FASTING BLOOD GLUCOSE: Value: 135 mg/dl  COMPARISON: This CT 06/08/2015  FINDINGS: NECK  No hypermetabolic lymph nodes in the neck.  CHEST  No hypermetabolic mediastinal or hilar nodes. No suspicious pulmonary nodules on the CT scan. Centrilobular emphysema in the upper lobes. Peripheral reticulation lower lobes.  ABDOMEN/PELVIS  No abnormal hypermetabolic activity within the liver, pancreas, adrenal glands, or spleen. No hypermetabolic lymph nodes in the abdomen or pelvis.  Infrarenal abdominal aorta is dilated to 41 mm. Dense calcification. Penile prosthetic reservoir and apparatus noted.  SKELETON  Metabolic activity associated the anterior medial RIGHT fifth rib. There is a healed fracture at this level.  IMPRESSION: 1. No evidence of abnormal metabolic activity to suggests plasmacytoma or multiple myeloma. 2. Metabolic activity associated with anterior medial RIGHT fifth rib is felt to be posttraumatic. 3. Emphysematous change within the lungs. 4. Infrarenal abdominal aortic aneurysm measuring 40 mm with intimal calcification. Recommend followup by ultrasound in 1 year. This recommendation follows ACR consensus guidelines: White  Paper of the ACR Incidental Findings Committee II on Vascular Findings. J Am Coll Radiol 2013; 63:729-426. 7 .   Electronically Signed  By: Suzy Bouchard M.D.  On: 09/27/2015 14:35   Assessment  #1 IgM kappa MGUS. PET/CT scan with no overt evidence of lymphoplasmacytic lymphoma or multiple myeloma. #2 chronic kidney disease stage III if worsening renal function might need to consider kidney biopsy to rule out the possibility of AL amyloidosis associated with his IgM MGUS which is a rare but known  entity. #3 will hold off on bone marrow biopsy at this time since his blood counts are stable and finding of small amounts of lymphoplasmacytic lymphoma will not necessarily lead to treatment. Plan -Called and left message for patient to call back to discuss results. -We will try to follow-up again. -Patient to keep his follow-up appointment on 12/03/2015 with [place] in the hematology clinic. -Follow-up with vascular surgery and primary care physician to monitor AAA and for further management.   Sullivan Lone MD MS Hematology/Oncology Physician [facility]

## 2015-10-12 ENCOUNTER — Telehealth: Payer: Self-pay | Admitting: Hematology

## 2015-10-12 NOTE — Telephone Encounter (Signed)
S/w pt advised appt chg from 12/13 md pal to 12/6 @ 3pm. Pt verbalized understanding. Also mailed revised calendar.

## 2015-11-25 ENCOUNTER — Telehealth: Payer: Self-pay | Admitting: Hematology

## 2015-11-25 NOTE — Telephone Encounter (Signed)
PAL - moved 12/6 f/u to 12/12. Spoke with patient he is aware. Per patient mailed schedule.

## 2015-12-02 ENCOUNTER — Ambulatory Visit (HOSPITAL_COMMUNITY)
Admission: RE | Admit: 2015-12-02 | Discharge: 2015-12-02 | Disposition: A | Discharge status: Home / Self Care | Payer: Medicare Other | Source: Ambulatory Visit | Attending: Vascular Surgery | Admitting: Vascular Surgery

## 2015-12-02 ENCOUNTER — Telehealth: Payer: Self-pay

## 2015-12-02 ENCOUNTER — Other Ambulatory Visit: Payer: Self-pay

## 2015-12-02 ENCOUNTER — Encounter: Payer: Self-pay | Admitting: Vascular Surgery

## 2015-12-02 ENCOUNTER — Other Ambulatory Visit: Payer: Self-pay | Admitting: *Deleted

## 2015-12-02 DIAGNOSIS — M549 Dorsalgia, unspecified: Secondary | ICD-10-CM

## 2015-12-02 DIAGNOSIS — I714 Abdominal aortic aneurysm, without rupture, unspecified: Secondary | ICD-10-CM

## 2015-12-02 DIAGNOSIS — Z01812 Encounter for preprocedural laboratory examination: Secondary | ICD-10-CM

## 2015-12-02 LAB — POCT I-STAT CREATININE: Creatinine, Ser: 2.1 mg/dL — ABNORMAL HIGH (ref 0.61–1.24)

## 2015-12-02 NOTE — Telephone Encounter (Signed)
Rec'd call from [place] @ [facility].  Reported the pt's Creatinine is 2.1, and unable to have the CTA Abd/ Pelvis.  Noted pt's last OV was 01/17/15 with duplex of abdomen showed AAA 4.15 x 4.15 cm.  Discussed with Dr. Bridgett Larsson.  Recommended to cancel the CTA Abd/ pelvis, and schedule for Ultrasound of Abdomen prior to office visit on 12/06/15.  Notified CT Tech, Lisa, and pt. of plan to cancel CTA abd/ pelvis.  Gave pt. instructions prior to abdominal ultrasound: to eat a protein snack at bedtime 12/5,  NPO after MN 12/5, hold morning doses of diabetic medications AM of 12/6, and only take sips of water with morning doses of heart/ blood pressure medications AM 12/6.  Verb. Understanding.

## 2015-12-06 ENCOUNTER — Encounter: Payer: Self-pay | Admitting: Vascular Surgery

## 2015-12-06 ENCOUNTER — Ambulatory Visit (HOSPITAL_COMMUNITY)
Admission: RE | Admit: 2015-12-06 | Discharge: 2015-12-06 | Disposition: A | Discharge status: Home / Self Care | Payer: Medicare Other | Source: Ambulatory Visit | Attending: Vascular Surgery | Admitting: Vascular Surgery

## 2015-12-06 ENCOUNTER — Ambulatory Visit (INDEPENDENT_AMBULATORY_CARE_PROVIDER_SITE_OTHER): Payer: Medicare Other | Admitting: Vascular Surgery

## 2015-12-06 ENCOUNTER — Ambulatory Visit: Payer: Medicare Other | Admitting: Hematology

## 2015-12-06 VITALS — BP 165/76 | HR 75 | Temp 97.5°F | Resp 20 | Ht 70.0 in | Wt 190.4 lb

## 2015-12-06 DIAGNOSIS — E7800 Pure hypercholesterolemia, unspecified: Secondary | ICD-10-CM | POA: Diagnosis not present

## 2015-12-06 DIAGNOSIS — I714 Abdominal aortic aneurysm, without rupture, unspecified: Secondary | ICD-10-CM

## 2015-12-06 DIAGNOSIS — E119 Type 2 diabetes mellitus without complications: Secondary | ICD-10-CM | POA: Insufficient documentation

## 2015-12-06 NOTE — Progress Notes (Signed)
Filed Vitals:   12/06/15 1028 12/06/15 1030  BP: 167/78 165/76  Pulse: 75   Temp: 97.5 F (36.4 C)   TempSrc: Oral   Resp: 20   Height: '5\' 10"'$  (1.778 m)   Weight: 190 lb 6.4 oz (86.365 kg)   SpO2: 96%

## 2015-12-06 NOTE — Progress Notes (Signed)
[facility] [place]  Patient name: Alan Ford MRN: [id number] DOB: [birth year] Sex: male  REASON FOR VISIT: follow-up  HPI: Alan Ford is a 81 y.o. male who returns for follow-up of his 4.1 cm abdominal aortic aneurysm. He was last seen in the office on 01/17/15 by Clemon Chambers, NP. He recently went to see his orthopedist, Dr. Mayer Camel for left hip pain. The patient had x-rays of his hip and spine and his abdominal aneurysm appeared to be around 5.5 cm. He was sent back here for further evaluation.  The patient denies any abdominal or new back pain. He uses oxygen as needed. He denies pain with his legs. His ambulation is limited by exertional dyspnea.   Past Medical History  Diagnosis Date  . Diabetes mellitus   . High blood pressure   . High cholesterol   . CHF (congestive heart failure) ([place])   . Emphysema   . Joint pain   . AAA (abdominal aortic aneurysm) ([place])   . COPD (chronic obstructive pulmonary disease) ([place])   . Chronic kidney disease   . Irregular heart beat   . PVC (premature ventricular contraction)   . Shingles   . Chronic renal disease, stage III   . Sleep apnea     cpap  . Irregular heart beat   . CAD (coronary artery disease)   . Chronic diastolic heart failure ([place]) 07/24/2011  . Atrial fibrillation (HCC)     Family History  Problem Relation Age of Onset  . Other Father     AAA  . Heart disease Father     After age 72,   AAA  . Hypertension Father   . Hyperlipidemia Father   . Heart attack Father   . Diabetes Mother     amputation  . Heart disease Mother   . Hypertension Mother   . Hyperlipidemia Mother   . Deep vein thrombosis Mother   . Heart attack Mother   . Diabetes Sister   . Heart disease Sister     Heart Disease before age 50  . Hypertension Sister   . Hyperlipidemia Sister   . Diabetes Brother   . Heart disease Brother   . Hypertension Brother   . Hyperlipidemia Brother   . Cancer Daughter     SOCIAL  HISTORY: Social History  Substance Use Topics  . Smoking status: Former Smoker -- 3.00 packs/day for 30 years    Types: Cigarettes    Quit date: 12/31/1985  . Smokeless tobacco: Former Systems developer    Types: Chew     Comment: "little chewing tobacco"  . Alcohol Use: No     Comment: quit in 1985    No Known Allergies  Current Outpatient Prescriptions  Medication Sig Dispense Refill  . AMLODIPINE BESYLATE PO Take 1 tablet by mouth daily.     . Ascorbic Acid (VITAMIN C) 1000 MG tablet Take 1,000 mg by mouth daily.    Marland Kitchen aspirin EC 81 MG tablet Take 81 mg by mouth at bedtime.     Marland Kitchen BAYER CONTOUR TEST test strip     . carvedilol (COREG) 3.125 MG tablet Take 3.125 mg by mouth 2 (two) times daily with a meal.    . Chromium Picolinate 800 MCG TABS Take 2 tablets by mouth daily.      . Coenzyme Q10 (CO Q-10) 100 MG CAPS Take 100 mg by mouth at bedtime.     Marland Kitchen glipiZIDE (GLUCOTROL XL) 10 MG 24  hr tablet Take 10 mg by mouth at bedtime.    Marland Kitchen glipiZIDE (GLUCOTROL XL) 2.5 MG 24 hr tablet Take 2.5 mg by mouth daily.    Marland Kitchen HYDROcodone-acetaminophen (NORCO/VICODIN) 5-325 MG per tablet 1 tablet as needed.    . metFORMIN (GLUCOPHAGE) 1000 MG tablet Take 1,000 mg by mouth 2 (two) times daily with a meal.    . simvastatin (ZOCOR) 20 MG tablet Take 20 mg by mouth at bedtime.     . Tamsulosin HCl (FLOMAX) 0.4 MG CAPS Take 0.4 mg by mouth at bedtime.     . Tiotropium Bromide Monohydrate (SPIRIVA RESPIMAT) 1.25 MCG/ACT AERS Inhale 2 puffs into the lungs daily. 1 Inhaler 5  . zinc gluconate 50 MG tablet Take 50 mg by mouth at bedtime.      No current facility-administered medications for this visit.    REVIEW OF SYSTEMS:  '[X]'$  denotes positive finding, '[ ]'$  denotes negative finding Cardiac  Comments:  Chest pain or chest pressure:    Shortness of breath upon exertion: x   Short of breath when lying flat:    Irregular heart rhythm:        Vascular    Pain in calf, thigh, or hip brought on by ambulation: x     Pain in feet at night that wakes you up from your sleep:     Blood clot in your veins:    Leg swelling:  x       Pulmonary    Oxygen at home: x   Productive cough:     Wheezing:         Neurologic    Sudden weakness in arms or legs:  x   Sudden numbness in arms or legs:     Sudden onset of difficulty speaking or slurred speech: x   Temporary loss of vision in one eye:     Problems with dizziness:         Gastrointestinal    Blood in stool:     Vomited blood:         Genitourinary    Burning when urinating:     Blood in urine:        Psychiatric    Major depression:         Hematologic    Bleeding problems:    Problems with blood clotting too easily:        Skin    Rashes or ulcers:        Constitutional    Fever or chills:      PHYSICAL EXAM: Filed Vitals:   12/06/15 1028 12/06/15 1030  BP: 167/78 165/76  Pulse: 75   Temp: 97.5 F (36.4 C)   TempSrc: Oral   Resp: 20   Height: '5\' 10"'$  (1.778 m)   Weight: 190 lb 6.4 oz (86.365 kg)   SpO2: 96%     GENERAL: The patient is a well-nourished male, in no acute distress. The vital signs are documented above. CARDIAC: There is a regular rate and rhythm.  VASCULAR: No carotid bruits, feet warm bilaterally  PULMONARY: There is good air exchange bilaterally without wheezing or rales. ABDOMEN: Soft and non-tender.  MUSCULOSKELETAL: There are no major deformities or cyanosis. NEUROLOGIC: No focal weakness or paresthesias are detected. PSYCHIATRIC: The patient has a normal affect.  DATA:   AAA Duplex 12/06/15 Maximal diameter 4.2 x 4.2 cm, previously 4.1 x 4.1 cm (01/17/15) Left common iliac 1.6 cm Right common iliac 1.3 cm  MEDICAL ISSUES:  Abdominal aortic aneurysm, without mention of rupture The patient's abdominal aortic aneurysm remains stable as compared to previous study in January 2016. His recent plain films of his spine and hip likely overestimated the size of his aneurysm. The patient has remained  asymptomatic.   He is on maximal medical management with aspirin and a statin. He is a former smoker.  He will follow-up in 1 year with repeat AAA duplex.   Alvia Grove, PA-C [facility]     I have examined the patient, reviewed and agree with above.  Curt Jews, MD 12/06/2015 11:33 AM

## 2015-12-06 NOTE — Addendum Note (Signed)
Addended by: Thresa Ross C on: 12/06/2015 02:07 PM   Modules accepted: Orders

## 2015-12-12 ENCOUNTER — Encounter: Payer: Self-pay | Admitting: Hematology

## 2015-12-12 ENCOUNTER — Ambulatory Visit (HOSPITAL_BASED_OUTPATIENT_CLINIC_OR_DEPARTMENT_OTHER): Payer: Medicare Other

## 2015-12-12 ENCOUNTER — Ambulatory Visit (HOSPITAL_BASED_OUTPATIENT_CLINIC_OR_DEPARTMENT_OTHER): Payer: Medicare Other | Admitting: Hematology

## 2015-12-12 VITALS — BP 149/75 | HR 80 | Temp 97.5°F | Resp 18 | Ht 70.0 in | Wt 192.5 lb

## 2015-12-12 DIAGNOSIS — D472 Monoclonal gammopathy: Secondary | ICD-10-CM

## 2015-12-12 DIAGNOSIS — N189 Chronic kidney disease, unspecified: Secondary | ICD-10-CM | POA: Diagnosis not present

## 2015-12-12 LAB — COMPREHENSIVE METABOLIC PANEL
ALT: 17 U/L (ref 0–55)
AST: 19 U/L (ref 5–34)
Albumin: 3.7 g/dL (ref 3.5–5.0)
Alkaline Phosphatase: 76 U/L (ref 40–150)
Anion Gap: 11 mEq/L (ref 3–11)
BUN: 27.7 mg/dL — ABNORMAL HIGH (ref 7.0–26.0)
CALCIUM: 9.4 mg/dL (ref 8.4–10.4)
CO2: 17 meq/L — AB (ref 22–29)
CREATININE: 1.9 mg/dL — AB (ref 0.7–1.3)
Chloride: 115 mEq/L — ABNORMAL HIGH (ref 98–109)
EGFR: 32 mL/min/{1.73_m2} — ABNORMAL LOW (ref >90)
Glucose: 113 mg/dl (ref 70–140)
Potassium: 4.8 mEq/L (ref 3.5–5.1)
Sodium: 142 mEq/L (ref 136–145)
Total Bilirubin: <0.3 mg/dL (ref 0.20–1.20)
Total Protein: 7.9 g/dL (ref 6.4–8.3)

## 2015-12-12 LAB — CBC & DIFF AND RETIC
BASO%: 0.4 % (ref 0.0–2.0)
Basophils Absolute: 0 10*3/uL (ref 0.0–0.1)
EOS ABS: 0.2 10*3/uL (ref 0.0–0.5)
EOS%: 3.3 % (ref 0.0–7.0)
HCT: 37.7 % — ABNORMAL LOW (ref 38.4–49.9)
HGB: 12.6 g/dL — ABNORMAL LOW (ref 13.0–17.1)
IMMATURE RETIC FRACT: 11.7 % — AB (ref 3.00–10.60)
LYMPH%: 34.2 % (ref 14.0–49.0)
MCH: 32.5 pg (ref 27.2–33.4)
MCHC: 33.4 g/dL (ref 32.0–36.0)
MCV: 97.2 fL (ref 79.3–98.0)
MONO#: 0.5 10*3/uL (ref 0.1–0.9)
MONO%: 7.4 % (ref 0.0–14.0)
NEUT%: 54.7 % (ref 39.0–75.0)
NEUTROS ABS: 3.7 10*3/uL (ref 1.5–6.5)
Platelets: 199 10*3/uL (ref 140–400)
RBC: 3.88 10*6/uL — AB (ref 4.20–5.82)
RDW: 14.4 % (ref 11.0–14.6)
RETIC CT ABS: 89.63 10*3/uL (ref 34.80–93.90)
Retic %: 2.31 % — ABNORMAL HIGH (ref 0.80–1.80)
WBC: 6.8 10*3/uL (ref 4.0–10.3)
lymph#: 2.3 10*3/uL (ref 0.9–3.3)
nRBC: 0 % (ref 0–0)

## 2015-12-13 ENCOUNTER — Ambulatory Visit: Payer: Medicare Other | Admitting: Hematology

## 2015-12-14 LAB — KAPPA/LAMBDA LIGHT CHAINS
KAPPA LAMBDA RATIO: 2.88 — AB (ref 0.26–1.65)
Kappa free light chain: 7.4 mg/dL — ABNORMAL HIGH (ref 0.33–1.94)
LAMBDA FREE LGHT CHN: 2.57 mg/dL (ref 0.57–2.63)

## 2015-12-14 LAB — SPEP & IFE WITH QIG
ALBUMIN ELP: 3.7 g/dL — AB (ref 3.8–4.8)
Abnormal Protein Band1: 0.3 g/dL
Alpha-1-Globulin: 0.5 g/dL — ABNORMAL HIGH (ref 0.2–0.3)
Alpha-2-Globulin: 1.3 g/dL — ABNORMAL HIGH (ref 0.5–0.9)
BETA 2: 0.4 g/dL (ref 0.2–0.5)
Beta Globulin: 0.4 g/dL (ref 0.4–0.6)
GAMMA GLOBULIN: 0.9 g/dL (ref 0.8–1.7)
IGM, SERUM: 430 mg/dL — AB (ref 41–251)
IgA: 238 mg/dL (ref 68–379)
IgG (Immunoglobin G), Serum: 686 mg/dL (ref 650–1600)
TOTAL PROTEIN, SERUM ELECTROPHOR: 7.1 g/dL (ref 6.1–8.1)

## 2016-01-03 NOTE — Progress Notes (Signed)
Marland Kitchen    HEMATOLOGY/ONCOLOGY CLINIC NOTE  Date of Service: .12/12/2015  Patient Care Team: Lajean Manes, MD as PCP - General (Internal Medicine)  CHIEF COMPLAINTS/PURPOSE OF CONSULTATION:  IgM gammopathy of undetermined significance  HISTORY OF PRESENTING ILLNESS: Plz see my initial note for details on initial presentation.  INTERVAL HISTORY  Patient is here for follow-up of his IgM monoclonal gammopathy of undetermined significance. He notes no acute new symptoms. No fevers/chills/Weight loss/bone pain/enlarged Lymph nodes. No abdominal pain. No chest pain.   MEDICAL HISTORY:  Past Medical History  Diagnosis Date  . Diabetes mellitus   . High blood pressure   . High cholesterol   . CHF (congestive heart failure) ([place])   . Emphysema   . Joint pain   . AAA (abdominal aortic aneurysm) ([place])   . COPD (chronic obstructive pulmonary disease) ([place])   . Chronic kidney disease   . Irregular heart beat   . PVC (premature ventricular contraction)   . Shingles   . Chronic renal disease, stage III   . Sleep apnea     cpap  . Irregular heart beat   . CAD (coronary artery disease)   . Chronic diastolic heart failure ([place]) 07/24/2011  . Atrial fibrillation ([place])     SURGICAL HISTORY: Past Surgical History  Procedure Laterality Date  . Penile prosthesis implant  09-2005  . Tonsillectomy    . Urethrotomy      SOCIAL HISTORY: Social History   Social History  . Marital Status: Married    Spouse Name: N/A  . Number of Children: 2  . Years of Education: N/A   Occupational History  . mental health    Social History Main Topics  . Smoking status: Former Smoker -- 3.00 packs/day for 30 years    Types: Cigarettes    Quit date: 12/31/1985  . Smokeless tobacco: Former Systems developer    Types: Chew     Comment: "little chewing tobacco"  . Alcohol Use: No     Comment: quit in 1985  . Drug Use: No  . Sexual Activity: Not on file   Other Topics Concern  . Not on file   Social  History Narrative    FAMILY HISTORY: Family History  Problem Relation Age of Onset  . Other Father     AAA  . Heart disease Father     After age 11,   AAA  . Hypertension Father   . Hyperlipidemia Father   . Heart attack Father   . Diabetes Mother     amputation  . Heart disease Mother   . Hypertension Mother   . Hyperlipidemia Mother   . Deep vein thrombosis Mother   . Heart attack Mother   . Diabetes Sister   . Heart disease Sister     Heart Disease before age 31  . Hypertension Sister   . Hyperlipidemia Sister   . Diabetes Brother   . Heart disease Brother   . Hypertension Brother   . Hyperlipidemia Brother   . Cancer Daughter     ALLERGIES:  has No Known Allergies.  MEDICATIONS:  Current Outpatient Prescriptions  Medication Sig Dispense Refill  . AMLODIPINE BESYLATE PO Take 1 tablet by mouth daily.     . Ascorbic Acid (VITAMIN C) 1000 MG tablet Take 1,000 mg by mouth daily.    Marland Kitchen aspirin EC 81 MG tablet Take 81 mg by mouth at bedtime.     Marland Kitchen BAYER CONTOUR TEST test strip     .  carvedilol (COREG) 3.125 MG tablet Take 3.125 mg by mouth 2 (two) times daily with a meal.    . Chromium Picolinate 800 MCG TABS Take 2 tablets by mouth daily.      . Coenzyme Q10 (CO Q-10) 100 MG CAPS Take 100 mg by mouth at bedtime.     Marland Kitchen glipiZIDE (GLUCOTROL XL) 10 MG 24 hr tablet Take 10 mg by mouth at bedtime.    Marland Kitchen glipiZIDE (GLUCOTROL XL) 2.5 MG 24 hr tablet Take 2.5 mg by mouth daily.    Marland Kitchen HYDROcodone-acetaminophen (NORCO/VICODIN) 5-325 MG per tablet 1 tablet as needed.    . metFORMIN (GLUCOPHAGE) 1000 MG tablet Take 1,000 mg by mouth 2 (two) times daily with a meal.    . simvastatin (ZOCOR) 20 MG tablet Take 20 mg by mouth at bedtime.     . Tamsulosin HCl (FLOMAX) 0.4 MG CAPS Take 0.4 mg by mouth at bedtime.     . Tiotropium Bromide Monohydrate (SPIRIVA RESPIMAT) 1.25 MCG/ACT AERS Inhale 2 puffs into the lungs daily. 1 Inhaler 5  . zinc gluconate 50 MG tablet Take 50 mg by mouth at  bedtime.      No current facility-administered medications for this visit.    REVIEW OF SYSTEMS:    10 Point review of Systems was done is negative except as noted above.  PHYSICAL EXAMINATION: ECOG PERFORMANCE STATUS: 1 - Symptomatic but completely ambulatory  . Filed Vitals:   12/12/15 1507  Height: '5\' 10"'$  (1.778 m)  Weight: 192 lb 8 oz (87.317 kg)   Filed Weights   12/12/15 1507  Weight: 192 lb 8 oz (87.317 kg)   .Body mass index is 27.62 kg/(m^2).  GENERAL:alert, in no acute distress and comfortable SKIN: no acute rashes EYES: normal, conjunctiva are pink and non-injected, sclera clear OROPHARYNX:no exudate, no erythema and lips, buccal mucosa, and tongue normal  NECK: supple, no JVD, thyroid normal size,No palpable adenopathy LYMPH:  no palpable lymphadenopathy in the cervical, axillary or inguinal LUNGS: clear to auscultation with normal respiratory effort HEART: regular rate & rhythm ABDOMEN: abdomen soft, non-tender, normoactive bowel sounds no overt hepatosplenomegaly of palpable Musculoskeletal: no cyanosis of digits and no clubbing  PSYCH: alert & oriented x 3 with fluent speech NEURO: no focal motor/sensory deficits  LABORATORY DATA:  I have reviewed the data as listed  . CBC Latest Ref Rng 12/12/2015 09/26/2015 09/13/2015  WBC 4.0 - 10.3 10e3/uL 6.8 8.6 9.3  Hemoglobin 13.0 - 17.1 g/dL 12.6(L) 12.0(L) 12.4(L)  Hematocrit 38.4 - 49.9 % 37.7(L) 36.0(L) 36.8(L)  Platelets 140 - 400 10e3/uL 199 186 216    . CMP Latest Ref Rng 12/12/2015 12/02/2015 09/26/2015  Glucose 70 - 140 mg/dl 113 - 116  BUN 7.0 - 26.0 mg/dL 27.7(H) - 18.4  Creatinine 0.7 - 1.3 mg/dL 1.9(H) 2.10(H) 1.9(H)  Sodium 136 - 145 mEq/L 142 - 142  Potassium 3.5 - 5.1 mEq/L 4.8 - 5.0  Chloride 96 - 112 mEq/L - - -  CO2 22 - 29 mEq/L 17(L) - 18(L)  Calcium 8.4 - 10.4 mg/dL 9.4 - 9.3  Total Protein 6.4 - 8.3 g/dL 7.9 - 7.4  Total Bilirubin 0.20 - 1.20 mg/dL <0.30 - <0.30  Alkaline Phos 40 -  150 U/L 76 - 88  AST 5 - 34 U/L 19 - 19  ALT 0 - 55 U/L 17 - 17   . Lab Results  Component Value Date   TOTALPROTELP 7.1 12/12/2015   ALBUMINELP 3.7* 12/12/2015   A1GS  0.5* 12/12/2015   A2GS 1.3* 12/12/2015   BETS 0.4 12/12/2015   BETA2SER 0.4 12/12/2015   GAMS 0.9 12/12/2015   SPEI * 12/12/2015  Monoclonal IgM kappa protein is present.Reviewed by Odis Hollingshead, MD, PhD, FCAP (Electronic Signature onFile) SPE Interp.  * *CM    Comments: A restricted band consistent with monoclonal protein is present.  The monoclonal protein peak accounts for 0.3 g/dL of the total  0.9 g/dL of protein in the gamma region.  Results are consistent with SPE performed on 09/14/15.  Reviewed by Francis Gaines Mammarappallil MD (Electronic Signature on File)                  Lab Results  Component Value Date   KPAFRELGTCHN 7.40* 12/12/2015   LAMBDASER 2.57 12/12/2015   KAPLAMBRATIO 2.88* 12/12/2015   (kappa/lambda light chains)   . Lab Results  Component Value Date   LDH 240 09/13/2015      RADIOGRAPHIC STUDIES: I have personally reviewed the radiological images as listed and agreed with the findings in the report. No results found.  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 No overt hyperviscosity symptoms, anemia, lymphadenopathy or hepatosplenomegaly.  LDH within normal limits. His repeat SPEP on follow-up today shows stable IgM kappa M protein spike of 0.3 g/dL. Blood counts are stable. No palpable lymphadenopathy or splenomegaly. No hepatomegaly. Previous PET/CT scan on 09/27/2015 showed no evidence of abnormal metabolic activity to suggest plasmacytoma or multiple myeloma. Plan -No indication for additional workup for the patient's IgM kappa monoclonal, they are undetermined significance. -The patient develops clinical lymphadenopathy would consider a lymph node biopsy to  rule out lymphoplasmacytic lymphoma. -If the patient develops cytopenias would consider a bone marrow biopsy to rule out lymphoplasmacytic lymphoma or other plasma cell dyscrasia.  #2 hypertension #3 dyslipidemia #4 diabetes type 2 #5 acute on chronic kidney disease creatinine 2.1 related to ?urinary obstruction from BPH. Less likely from IgM monoclonal protein. Likely baseline CKD from HTN/DM2. Creatinine and potassium are stable on labs today. #6 Hyperkalemia - ? Due to urinary obstruction vs CKD  potassium 5.5 Plan -No clear indication that the patient's paraproteinemia has anything to do with his chronic kidney disease. -If unexplained worsening of renal function might consider renal biopsy-though this would be relatively low yield. -Low potassium diet. -Continue follow-up with primary care physician and nephrology as needed.  Return to care in clinic with Dr. Irene Limbo in 3 months with repeat CBC, CMP, SPEP. Earlier if any other acute new concerns arise  I spent 15 minutes counseling the patient face to face. The total time spent in the appointment was 20 minutes and more than 50% was on counseling and direct patient cares.    Sullivan Lone MD [place] AAHIVMS [facility] Hematology/Oncology Physician [facility]  (Office):       [phone] (Work cell):  [phone] (Fax):           [phone]

## 2016-01-06 ENCOUNTER — Telehealth: Payer: Self-pay | Admitting: *Deleted

## 2016-01-06 NOTE — Telephone Encounter (Signed)
Called patient to inform him that SPEP levels are unchanged from previous labs drawn 3 months ago.  Pt advised to follow up as scheduled, next apt in March.  Pt verbalized understanding.

## 2016-01-09 ENCOUNTER — Ambulatory Visit (INDEPENDENT_AMBULATORY_CARE_PROVIDER_SITE_OTHER): Payer: Medicare Other | Admitting: Emergency Medicine

## 2016-01-09 ENCOUNTER — Encounter: Payer: Self-pay | Admitting: Emergency Medicine

## 2016-01-09 VITALS — BP 146/88 | HR 95 | Ht 70.5 in | Wt 191.0 lb

## 2016-01-09 DIAGNOSIS — J449 Chronic obstructive pulmonary disease, unspecified: Secondary | ICD-10-CM

## 2016-01-09 DIAGNOSIS — J849 Interstitial pulmonary disease, unspecified: Secondary | ICD-10-CM | POA: Diagnosis not present

## 2016-01-09 DIAGNOSIS — G4733 Obstructive sleep apnea (adult) (pediatric): Secondary | ICD-10-CM | POA: Diagnosis not present

## 2016-01-09 MED ORDER — ALBUTEROL SULFATE HFA 108 (90 BASE) MCG/ACT IN AERS: 2.0000 | INHALATION_SPRAY | Freq: Four times a day (QID) | RESPIRATORY_TRACT | Status: AC | PRN

## 2016-01-09 NOTE — Patient Instructions (Signed)
Stop Spiriva Continue your oxygen at 3-5L/min with exertion.  Keep albuterol available to use 2 puffs is needed for shortness of breath or wheezing Follow with Dr Lamonte Sakai in 6 months or sooner if you have any problems

## 2016-01-09 NOTE — Addendum Note (Signed)
Addended by: Desmond Dike C on: 01/09/2016 03:28 PM   Modules accepted: Orders

## 2016-01-09 NOTE — Assessment & Plan Note (Signed)
Continue CPAP qhs 

## 2016-01-09 NOTE — Assessment & Plan Note (Addendum)
We will continue to follow clinically. If his sx change then I will repeat his CT chest.  Continue O2 w exertion.  He is hoping to get reimbursed for his POC.

## 2016-01-09 NOTE — Assessment & Plan Note (Addendum)
Stop spiriva - he does n't appear to miss it.  Will teach him to do albuterol prn.
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
## 2016-01-09 NOTE — Addendum Note (Signed)
Addended by: Desmond Dike C on: 01/09/2016 03:30 PM   Modules accepted: Orders

## 2016-01-11 ENCOUNTER — Telehealth: Payer: Self-pay | Admitting: Emergency Medicine

## 2016-01-11 NOTE — Telephone Encounter (Signed)
Spoke with [name] at [place]. Advised her that the pt was given this order when he was here. The order should never have been sent to them. Nothing further was needed.

## 2016-01-23 ENCOUNTER — Ambulatory Visit: Payer: Medicare Other | Admitting: Family

## 2016-01-23 ENCOUNTER — Other Ambulatory Visit (HOSPITAL_COMMUNITY): Payer: Medicare Other

## 2016-03-08 ENCOUNTER — Other Ambulatory Visit: Payer: Self-pay | Admitting: Nephrology

## 2016-03-08 DIAGNOSIS — N183 Chronic kidney disease, stage 3 unspecified: Secondary | ICD-10-CM

## 2016-03-08 DIAGNOSIS — I159 Secondary hypertension, unspecified: Secondary | ICD-10-CM

## 2016-03-12 ENCOUNTER — Other Ambulatory Visit: Payer: Self-pay | Admitting: *Deleted

## 2016-03-12 ENCOUNTER — Encounter: Payer: Self-pay | Admitting: Hematology

## 2016-03-12 ENCOUNTER — Ambulatory Visit (HOSPITAL_BASED_OUTPATIENT_CLINIC_OR_DEPARTMENT_OTHER): Payer: Medicare Other | Admitting: Hematology

## 2016-03-12 ENCOUNTER — Other Ambulatory Visit (HOSPITAL_BASED_OUTPATIENT_CLINIC_OR_DEPARTMENT_OTHER): Payer: Medicare Other

## 2016-03-12 ENCOUNTER — Telehealth: Payer: Self-pay | Admitting: Hematology

## 2016-03-12 VITALS — BP 140/63 | HR 86 | Temp 97.6°F | Resp 16 | Ht 70.5 in | Wt 185.3 lb

## 2016-03-12 DIAGNOSIS — N183 Chronic kidney disease, stage 3 (moderate): Secondary | ICD-10-CM

## 2016-03-12 DIAGNOSIS — D472 Monoclonal gammopathy: Secondary | ICD-10-CM

## 2016-03-12 LAB — COMPREHENSIVE METABOLIC PANEL
ALT: 37 U/L (ref 0–55)
ANION GAP: 11 meq/L (ref 3–11)
AST: 46 U/L — AB (ref 5–34)
Albumin: 3.1 g/dL — ABNORMAL LOW (ref 3.5–5.0)
Alkaline Phosphatase: 73 U/L (ref 40–150)
BUN: 40.2 mg/dL — ABNORMAL HIGH (ref 7.0–26.0)
CALCIUM: 8.7 mg/dL (ref 8.4–10.4)
CHLORIDE: 107 meq/L (ref 98–109)
CO2: 19 meq/L — AB (ref 22–29)
Creatinine: 2.5 mg/dL — ABNORMAL HIGH (ref 0.7–1.3)
EGFR: 23 mL/min/{1.73_m2} — ABNORMAL LOW (ref >90)
Glucose: 163 mg/dl — ABNORMAL HIGH (ref 70–140)
POTASSIUM: 4.2 meq/L (ref 3.5–5.1)
Sodium: 138 mEq/L (ref 136–145)
TOTAL PROTEIN: 6.9 g/dL (ref 6.4–8.3)
Total Bilirubin: 0.31 mg/dL (ref 0.20–1.20)

## 2016-03-12 LAB — CBC & DIFF AND RETIC
BASO%: 0.3 % (ref 0.0–2.0)
BASOS ABS: 0 10*3/uL (ref 0.0–0.1)
EOS ABS: 0.2 10*3/uL (ref 0.0–0.5)
EOS%: 3 % (ref 0.0–7.0)
HEMATOCRIT: 37.1 % — AB (ref 38.4–49.9)
HEMOGLOBIN: 12.3 g/dL — AB (ref 13.0–17.1)
IMMATURE RETIC FRACT: 11.8 % — AB (ref 3.00–10.60)
LYMPH#: 2.9 10*3/uL (ref 0.9–3.3)
LYMPH%: 41.3 % (ref 14.0–49.0)
MCH: 32.8 pg (ref 27.2–33.4)
MCHC: 33.2 g/dL (ref 32.0–36.0)
MCV: 98.9 fL — ABNORMAL HIGH (ref 79.3–98.0)
MONO#: 0.6 10*3/uL (ref 0.1–0.9)
MONO%: 9 % (ref 0.0–14.0)
NEUT#: 3.2 10*3/uL (ref 1.5–6.5)
NEUT%: 46.4 % (ref 39.0–75.0)
PLATELETS: 160 10*3/uL (ref 140–400)
RBC: 3.75 10*6/uL — ABNORMAL LOW (ref 4.20–5.82)
RDW: 14.4 % (ref 11.0–14.6)
RETIC CT ABS: 91.13 10*3/uL (ref 34.80–93.90)
Retic %: 2.43 % — ABNORMAL HIGH (ref 0.80–1.80)
WBC: 6.9 10*3/uL (ref 4.0–10.3)

## 2016-03-12 NOTE — Telephone Encounter (Signed)
Gave and printed appt shced and avs for pt for Sept

## 2016-03-13 ENCOUNTER — Other Ambulatory Visit: Payer: Self-pay | Admitting: Nephrology

## 2016-03-13 DIAGNOSIS — N183 Chronic kidney disease, stage 3 unspecified: Secondary | ICD-10-CM

## 2016-03-14 LAB — PROTEIN ELECTROPHORESIS, SERUM
A/G Ratio: 0.9 (ref 0.7–1.7)
ALPHA 2: 1.2 g/dL — AB (ref 0.4–1.0)
Albumin: 3 g/dL (ref 2.9–4.4)
Alpha 1: 0.3 g/dL (ref 0.0–0.4)
BETA: 1 g/dL (ref 0.7–1.3)
GAMMA GLOBULIN: 0.8 g/dL (ref 0.4–1.8)
GLOBULIN, TOTAL: 3.3 g/dL (ref 2.2–3.9)
M-Spike, %: 0.3 g/dL — ABNORMAL HIGH
Total Protein: 6.3 g/dL (ref 6.0–8.5)

## 2016-03-15 ENCOUNTER — Telehealth: Payer: Self-pay | Admitting: *Deleted

## 2016-03-15 NOTE — Telephone Encounter (Signed)
Called patient per Dr. Irene Limbo to inform protein levels are stable.  Informed patient that kidney levels have elevated slightly, most likely due to recent food poisining.  Instructed patient to increase fluid intake to 2L a day if possible.  Pt verbalized understanding.  No additional follow-up necessary.

## 2016-03-15 NOTE — Progress Notes (Signed)
Marland Kitchen    HEMATOLOGY/ONCOLOGY CLINIC NOTE  Date of Service: .03/12/2016   Patient Care Team: Lajean Manes, MD as PCP - General (Internal Medicine)  CHIEF COMPLAINTS/PURPOSE OF CONSULTATION:  IgM gammopathy of undetermined significance  HISTORY OF PRESENTING ILLNESS: Plz see my initial note for details on initial presentation.  INTERVAL HISTORY  Patient is here for follow-up of his IgM monoclonal gammopathy of undetermined significance. He notes that he had nausea or vomiting and diarrhea over the last few days and suspects that he likely had food poisoning.  He notes that he is doing better now and drink more fluids. No fevers/chills/Weight loss/bone pain/enlarged Lymph nodes. No abdominal pain. No chest pain. Notes that he is otherwise in good spirits.  No change in energy levels since his last visit.   MEDICAL HISTORY:  Past Medical History  Diagnosis Date  . Diabetes mellitus   . High blood pressure   . High cholesterol   . CHF (congestive heart failure) ([place])   . Emphysema   . Joint pain   . AAA (abdominal aortic aneurysm) ([place])   . COPD (chronic obstructive pulmonary disease) ([place])   . Chronic kidney disease   . Irregular heart beat   . PVC (premature ventricular contraction)   . Shingles   . Chronic renal disease, stage III   . Sleep apnea     cpap  . Irregular heart beat   . CAD (coronary artery disease)   . Chronic diastolic heart failure ([place]) 07/24/2011  . Atrial fibrillation ([place])     SURGICAL HISTORY: Past Surgical History  Procedure Laterality Date  . Penile prosthesis implant  09-2005  . Tonsillectomy    . Urethrotomy      SOCIAL HISTORY: Social History   Social History  . Marital Status: Married    Spouse Name: N/A  . Number of Children: 2  . Years of Education: N/A   Occupational History  . mental health    Social History Main Topics  . Smoking status: Former Smoker -- 3.00 packs/day for 30 years    Types: Cigarettes    Quit date:  12/31/1985  . Smokeless tobacco: Former Systems developer    Types: Chew     Comment: "little chewing tobacco"  . Alcohol Use: No     Comment: quit in 1985  . Drug Use: No  . Sexual Activity: Not on file   Other Topics Concern  . Not on file   Social History Narrative    FAMILY HISTORY: Family History  Problem Relation Age of Onset  . Other Father     AAA  . Heart disease Father     After age 74,   AAA  . Hypertension Father   . Hyperlipidemia Father   . Heart attack Father   . Diabetes Mother     amputation  . Heart disease Mother   . Hypertension Mother   . Hyperlipidemia Mother   . Deep vein thrombosis Mother   . Heart attack Mother   . Diabetes Sister   . Heart disease Sister     Heart Disease before age 29  . Hypertension Sister   . Hyperlipidemia Sister   . Diabetes Brother   . Heart disease Brother   . Hypertension Brother   . Hyperlipidemia Brother   . Cancer Daughter     ALLERGIES:  has No Known Allergies.  MEDICATIONS:  Current Outpatient Prescriptions  Medication Sig Dispense Refill  . furosemide (LASIX) 20 MG tablet Take 20 mg  by mouth 2 (two) times daily.    Marland Kitchen albuterol (PROVENTIL HFA;VENTOLIN HFA) 108 (90 Base) MCG/ACT inhaler Inhale 2 puffs into the lungs every 6 (six) hours as needed for wheezing or shortness of breath. 1 Inhaler 2  . AMLODIPINE BESYLATE PO Take 1 tablet by mouth daily.     . Ascorbic Acid (VITAMIN C) 1000 MG tablet Take 1,000 mg by mouth daily.    Marland Kitchen aspirin EC 81 MG tablet Take 81 mg by mouth at bedtime.     Marland Kitchen BAYER CONTOUR TEST test strip     . carvedilol (COREG) 3.125 MG tablet Take 3.125 mg by mouth 2 (two) times daily with a meal.    . cephALEXin (KEFLEX) 500 MG capsule     . Chromium Picolinate 800 MCG TABS Take 2 tablets by mouth daily.      . Coenzyme Q10 (CO Q-10) 100 MG CAPS Take 100 mg by mouth at bedtime.     Marland Kitchen glipiZIDE (GLUCOTROL XL) 10 MG 24 hr tablet Take 10 mg by mouth at bedtime.    Marland Kitchen glipiZIDE (GLUCOTROL XL) 2.5 MG 24  hr tablet Take 2.5 mg by mouth daily.    Marland Kitchen HYDROcodone-acetaminophen (NORCO/VICODIN) 5-325 MG per tablet 1 tablet as needed.    . metFORMIN (GLUCOPHAGE) 1000 MG tablet Take 1,000 mg by mouth 2 (two) times daily with a meal.    . simvastatin (ZOCOR) 20 MG tablet Take 20 mg by mouth at bedtime.     . Tamsulosin HCl (FLOMAX) 0.4 MG CAPS Take 0.4 mg by mouth at bedtime.     Marland Kitchen zinc gluconate 50 MG tablet Take 50 mg by mouth at bedtime.      No current facility-administered medications for this visit.    REVIEW OF SYSTEMS:    10 Point review of Systems was done is negative except as noted above.  PHYSICAL EXAMINATION: ECOG PERFORMANCE STATUS: 1 - Symptomatic but completely ambulatory  . Filed Vitals:   03/12/16 1526  Height: 5' 10.5" (1.791 m)  Weight: 185 lb 4.8 oz (84.052 kg)   Filed Weights   03/12/16 1526  Weight: 185 lb 4.8 oz (84.052 kg)   .Body mass index is 26.2 kg/(m^2).  GENERAL:alert, in no acute distress and comfortable SKIN: no acute rashes EYES: normal, conjunctiva are pink and non-injected, sclera clear OROPHARYNX:no exudate, no erythema and lips, buccal mucosa, and tongue normal  NECK: supple, no JVD, thyroid normal size,No palpable adenopathy LYMPH:  no palpable lymphadenopathy in the cervical, axillary or inguinal LUNGS: clear to auscultation with normal respiratory effort HEART: regular rate & rhythm ABDOMEN: abdomen soft, non-tender, normoactive bowel sounds no overt hepatosplenomegaly of palpable Musculoskeletal: no cyanosis of digits and no clubbing  PSYCH: alert & oriented x 3 with fluent speech NEURO: no focal motor/sensory deficits  LABORATORY DATA:  I have reviewed the data as listed  . CBC Latest Ref Rng 03/12/2016 12/12/2015 09/26/2015  WBC 4.0 - 10.3 10e3/uL 6.9 6.8 8.6  Hemoglobin 13.0 - 17.1 g/dL 12.3(L) 12.6(L) 12.0(L)  Hematocrit 38.4 - 49.9 % 37.1(L) 37.7(L) 36.0(L)  Platelets 140 - 400 10e3/uL 160 199 186   . CBC    Component Value  Date/Time   WBC 6.9 03/12/2016 1514   WBC 9.1 10/10/2009 0510   RBC 3.75* 03/12/2016 1514   RBC 4.14* 10/10/2009 0510   HGB 12.3* 03/12/2016 1514   HGB 13.3 10/10/2009 0510   HCT 37.1* 03/12/2016 1514   HCT 39.7 10/10/2009 0510   PLT 160 03/12/2016  1514   PLT 231 10/10/2009 0510   MCV 98.9* 03/12/2016 1514   MCV 95.9 10/10/2009 0510   MCH 32.8 03/12/2016 1514   MCHC 33.2 03/12/2016 1514   MCHC 33.5 10/10/2009 0510   RDW 14.4 03/12/2016 1514   RDW 13.0 10/10/2009 0510   LYMPHSABS 2.9 03/12/2016 1514   LYMPHSABS 1.5 10/06/2009 0650   MONOABS 0.6 03/12/2016 1514   MONOABS 0.9 10/06/2009 0650   EOSABS 0.2 03/12/2016 1514   EOSABS 0.0 10/06/2009 0650   BASOSABS 0.0 03/12/2016 1514   BASOSABS 0.0 10/06/2009 0650     . CMP Latest Ref Rng 03/12/2016 03/12/2016 12/12/2015  Glucose 70 - 140 mg/dl 163(H) - 113  BUN 7.0 - 26.0 mg/dL 40.2(H) - 27.7(H)  Creatinine 0.7 - 1.3 mg/dL 2.5(H) - 1.9(H)  Sodium 136 - 145 mEq/L 138 - 142  Potassium 3.5 - 5.1 mEq/L 4.2 - 4.8  CO2 22 - 29 mEq/L 19(L) - 17(L)  Calcium 8.4 - 10.4 mg/dL 8.7 - 9.4  Total Protein 6.0 - 8.5 g/dL 6.9 6.3 7.9  Total Bilirubin 0.20 - 1.20 mg/dL 0.31 - <0.30  Alkaline Phos 40 - 150 U/L 73 - 76  AST 5 - 34 U/L 46(H) - 19  ALT 0 - 55 U/L 37 - 17    . Lab Results  Component Value Date   TOTALPROTELP 7.1 12/12/2015   ALBUMINELP 3.7* 12/12/2015   A1GS 0.5* 12/12/2015   A2GS 1.3* 12/12/2015   BETS 0.4 12/12/2015   BETA2SER 0.4 12/12/2015   GAMS 0.9 12/12/2015   MSPIKE 0.3* 03/12/2016   SPEI * 12/12/2015  Monoclonal IgM kappa protein is present.Reviewed by Odis Hollingshead, MD, PhD, FCAP (Electronic Signature onFile) SPE Interp.  * *CM    Comments: A restricted band consistent with monoclonal protein is present.  The monoclonal protein peak accounts for 0.3 g/dL of the total  0.9 g/dL of protein in the gamma region.  Results are consistent with SPE performed on 09/14/15.  Reviewed by Francis Gaines Mammarappallil MD  (Electronic Signature on File)                  Lab Results  Component Value Date   KPAFRELGTCHN 7.40* 12/12/2015   LAMBDASER 2.57 12/12/2015   KAPLAMBRATIO 2.88* 12/12/2015   (kappa/lambda light chains)   . Lab Results  Component Value Date   LDH 240 09/13/2015      RADIOGRAPHIC STUDIES: I have personally reviewed the radiological images as listed and agreed with the findings in the report. No results found.  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 No overt hyperviscosity symptoms, anemia, lymphadenopathy or hepatosplenomegaly.  LDH within normal limits. His repeat SPEP on follow-up today shows stable IgM kappa M protein spike of 0.3 g/dL. Which has remained unchanged since his initial visit. Blood counts are stable. No palpable lymphadenopathy or splenomegaly. No hepatomegaly. Previous PET/CT scan on 09/27/2015 showed no evidence of abnormal metabolic activity to suggest plasmacytoma or multiple myeloma. Plan -No indication of disease progression of the patient's IgM kappa MGUS. -if patient develops clinical lymphadenopathy would consider a lymph node biopsy to rule out lymphoplasmacytic lymphoma. -If the patient develops cytopenias would consider a bone marrow biopsy to rule out lymphoplasmacytic lymphoma or other plasma cell dyscrasia.  #2 hypertension #3 dyslipidemia #4 diabetes type 2 #5 acute on chronic kidney disease creatinine 2.5. Elevated BUN and creatinine likely related to his nausea vomiting  and diarrhea from suspected food poisoning.  These have now resolved. Likely baseline CKD from HTN/DM2. potassium are stable on labs today. Plan -No clear indication that the patient's paraproteinemia has anything to do with his chronic kidney disease. -patient has been advised to increase oral fluid intake in the setting of some signs of dehydration. -He has a  renal ultrasound ordered for 03/19/2016 and has a nephrology referral arranged by his primary care physician. -If unexplained worsening of renal function might consider renal biopsy-though this would be relatively low yield. -Low potassium diet. -Continue follow-up with primary care physician and nephrology as needed.  Return to care in clinic with Dr. Irene Limbo in 6 months with repeat CBC, CMP, SPEP. Earlier if any other acute new concerns arise  I spent 15 minutes counseling the patient face to face. The total time spent in the appointment was 20 minutes and more than 50% was on counseling and direct patient cares.    Sullivan Lone MD [place] [facility] Hematology/Oncology Physician [facility]  (Office):       [phone] (Work cell):  [phone] (Fax):           [phone]

## 2016-03-16 ENCOUNTER — Other Ambulatory Visit: Payer: Medicare Other

## 2016-03-19 ENCOUNTER — Ambulatory Visit
Admission: RE | Admit: 2016-03-19 | Discharge: 2016-03-19 | Disposition: A | Discharge status: Home / Self Care | Payer: Medicare Other | Source: Ambulatory Visit | Attending: Nephrology | Admitting: Nephrology

## 2016-03-19 DIAGNOSIS — N183 Chronic kidney disease, stage 3 unspecified: Secondary | ICD-10-CM

## 2016-03-27 ENCOUNTER — Other Ambulatory Visit: Payer: Self-pay | Admitting: Geriatric Medicine

## 2016-03-27 DIAGNOSIS — R1310 Dysphagia, unspecified: Secondary | ICD-10-CM

## 2016-03-29 ENCOUNTER — Other Ambulatory Visit: Payer: Medicare Other

## 2016-04-02 ENCOUNTER — Ambulatory Visit
Admission: RE | Admit: 2016-04-02 | Discharge: 2016-04-02 | Disposition: A | Discharge status: Home / Self Care | Payer: Medicare Other | Source: Ambulatory Visit | Attending: Geriatric Medicine | Admitting: Geriatric Medicine

## 2016-04-02 DIAGNOSIS — R1310 Dysphagia, unspecified: Secondary | ICD-10-CM

## 2016-05-09 ENCOUNTER — Encounter: Payer: Self-pay | Admitting: Adult Health

## 2016-05-09 ENCOUNTER — Ambulatory Visit (INDEPENDENT_AMBULATORY_CARE_PROVIDER_SITE_OTHER)
Admission: RE | Admit: 2016-05-09 | Discharge: 2016-05-09 | Disposition: A | Discharge status: Home / Self Care | Payer: Medicare Other | Source: Ambulatory Visit | Attending: Adult Health | Admitting: Adult Health

## 2016-05-09 ENCOUNTER — Ambulatory Visit (INDEPENDENT_AMBULATORY_CARE_PROVIDER_SITE_OTHER): Payer: Medicare Other | Admitting: Adult Health

## 2016-05-09 ENCOUNTER — Ambulatory Visit: Payer: Medicare Other | Admitting: Adult Health

## 2016-05-09 VITALS — BP 124/68 | HR 76 | Temp 97.5°F | Ht 70.0 in | Wt 186.0 lb

## 2016-05-09 DIAGNOSIS — J449 Chronic obstructive pulmonary disease, unspecified: Secondary | ICD-10-CM

## 2016-05-09 MED ORDER — PREDNISONE 10 MG PO TABS: ORAL_TABLET | ORAL | Status: DC

## 2016-05-09 MED ORDER — AMOXICILLIN-POT CLAVULANATE 875-125 MG PO TABS: 1.0000 | ORAL_TABLET | Freq: Two times a day (BID) | ORAL | Status: DC

## 2016-05-09 NOTE — Progress Notes (Signed)
Subjective:    [name], male    DOB: Nov 30, 1930, 82 y.o.   MRN: [id number]  HPI 82 year old male former smoker with COPD and interstitial lung disease , oxygen dependent with activity  05/09/2016 Acute OV :  Patient presents for an acute office visit. Patient complains of chest tightness/congestion, prod cough with white/yellow colored mucus, increased SOB and wheezing x 2 -3 weeks.Pain on inspiration along bottom of ribs bilaterally.   Denies any sinus pressure/drainage, fever, nausea or vomiting. , hemoptysis , orthopnea, edema .  Appetite is okay .  Has not taken any over-the-counter medicines except for Tylenol. He denies any exertional chest pain, palpitations, syncope.   Past Medical History  Diagnosis Date  . Diabetes mellitus   . High blood pressure   . High cholesterol   . CHF (congestive heart failure) ([place])   . Emphysema   . Joint pain   . AAA (abdominal aortic aneurysm) ([place])   . COPD (chronic obstructive pulmonary disease) ([place])   . Chronic kidney disease   . Irregular heart beat   . PVC (premature ventricular contraction)   . Shingles   . Chronic renal disease, stage III   . Sleep apnea     cpap  . Irregular heart beat   . CAD (coronary artery disease)   . Chronic diastolic heart failure ([place]) 07/24/2011  . Atrial fibrillation [facility])    Current Outpatient Prescriptions on File Prior to Visit  Medication Sig Dispense Refill  . albuterol (PROVENTIL HFA;VENTOLIN HFA) 108 (90 Base) MCG/ACT inhaler Inhale 2 puffs into the lungs every 6 (six) hours as needed for wheezing or shortness of breath. 1 Inhaler 2  . AMLODIPINE BESYLATE PO Take 1 tablet by mouth daily.     . Ascorbic Acid (VITAMIN C) 1000 MG tablet Take 1,000 mg by mouth daily.    Marland Kitchen aspirin EC 81 MG tablet Take 81 mg by mouth at bedtime.     Marland Kitchen BAYER CONTOUR TEST test strip     . carvedilol (COREG) 3.125 MG tablet Take 3.125 mg by mouth 2 (two) times daily with a meal.    . cephALEXin  (KEFLEX) 500 MG capsule     . Chromium Picolinate 800 MCG TABS Take 2 tablets by mouth daily.      . Coenzyme Q10 (CO Q-10) 100 MG CAPS Take 100 mg by mouth at bedtime.     . furosemide (LASIX) 20 MG tablet Take 20 mg by mouth 2 (two) times daily.    Marland Kitchen glipiZIDE (GLUCOTROL XL) 10 MG 24 hr tablet Take 10 mg by mouth at bedtime.    Marland Kitchen glipiZIDE (GLUCOTROL XL) 2.5 MG 24 hr tablet Take 2.5 mg by mouth daily.    Marland Kitchen HYDROcodone-acetaminophen (NORCO/VICODIN) 5-325 MG per tablet 1 tablet as needed.    . metFORMIN (GLUCOPHAGE) 1000 MG tablet Take 1,000 mg by mouth 2 (two) times daily with a meal.    . simvastatin (ZOCOR) 20 MG tablet Take 20 mg by mouth at bedtime.     . Tamsulosin HCl (FLOMAX) 0.4 MG CAPS Take 0.4 mg by mouth at bedtime.     Marland Kitchen zinc gluconate 50 MG tablet Take 50 mg by mouth at bedtime.      No current facility-administered medications on file prior to visit.      Review of Systems Constitutional:   No  weight loss, night sweats,  Fevers, chills, + fatigue, or  lassitude.  HEENT:   No headaches,  Difficulty swallowing,  Tooth/dental problems, or  Sore throat,                No sneezing, itching, ear ache, nasal congestion, post nasal drip,   CV:  No chest pain,  Orthopnea, PND, swelling in lower extremities, anasarca, dizziness, palpitations, syncope.   GI  No heartburn, indigestion, abdominal pain, nausea, vomiting, diarrhea, change in bowel habits, loss of appetite, bloody stools.   Resp:   No chest wall deformity  Skin: no rash or lesions.  GU: no dysuria, change in color of urine, no urgency or frequency.  No flank pain, no hematuria   MS:  No joint pain or swelling.  No decreased range of motion.  No back pain.  Psych:  No change in mood or affect. No depression or anxiety.  No memory loss.         Objective:   Physical Exam Filed Vitals:   05/09/16 1147  BP: 124/68  Pulse: 76  Temp: 97.5 F (36.4 C)  TempSrc: Oral  Height: '5\' 10"'$  (1.778 m)  Weight: 186  lb (84.369 kg)  SpO2: 93%   GEN: A/Ox3; pleasant , NAD, Elderly  HEENT:  [place]/AT,  EACs-clear, TMs-wnl, NOSE-clear, THROAT-clear, no lesions, no postnasal drip or exudate noted.   NECK:  Supple w/ fair ROM; no JVD; normal carotid impulses w/o bruits; no thyromegaly or nodules palpated; no lymphadenopathy.  RESP  decreased breath sounds in the bases no accessory muscle use, no dullness to percussion  CARD:  RRR, no m/r/g  , no peripheral edema, pulses intact, no cyanosis or clubbing.  GI:   Soft & nt; nml bowel sounds; no organomegaly or masses detected.  Musco: Warm bil, no deformities or joint swelling noted.   Neuro: alert, no focal deficits noted.    Skin: Warm, no lesions or rashes  Blasa Raisch NP-C  [facility]  05/09/2016        Assessment & Plan:

## 2016-05-09 NOTE — Patient Instructions (Addendum)
Augmentin '875mg'$  Twice daily  For 7 days , take with food.  Mucinex DM Twice daily  As needed cough/congestion  Prednisone taper over next week.  Chest xray today .  Warm heat to ribs As needed   follow up Dr. Lamonte Sakai  As planned and As needed   Please contact office for sooner follow up if symptoms do not improve or worsen or seek emergency care

## 2016-05-09 NOTE — Assessment & Plan Note (Signed)
Exacerbation with bronchitis Check cxr today   Plan  Augmentin '875mg'$  Twice daily  For 7 days , take with food.  Mucinex DM Twice daily  As needed cough/congestion  Prednisone taper over next week.  Chest xray today .  Warm heat to ribs As needed   follow up Dr. Lamonte Sakai  As planned and As needed   Please contact office for sooner follow up if symptoms do not improve or worsen or seek emergency care

## 2016-05-11 NOTE — Progress Notes (Signed)
Quick Note:  Called spoke with pt. I offered ov with TP on 06/04/16 but pt refused stating he would be out of town and would not be available until the week of 06/11/16. I scheduled him on 06/11/16 with TP at 2:15. Pt voiced understanding and had no further questions. ______

## 2016-05-14 ENCOUNTER — Ambulatory Visit (INDEPENDENT_AMBULATORY_CARE_PROVIDER_SITE_OTHER): Payer: Medicare Other | Admitting: Adult Health

## 2016-05-14 ENCOUNTER — Encounter: Payer: Self-pay | Admitting: Adult Health

## 2016-05-14 ENCOUNTER — Encounter (HOSPITAL_COMMUNITY): Payer: Self-pay | Admitting: Emergency Medicine

## 2016-05-14 ENCOUNTER — Ambulatory Visit (INDEPENDENT_AMBULATORY_CARE_PROVIDER_SITE_OTHER)
Admission: RE | Admit: 2016-05-14 | Discharge: 2016-05-14 | Disposition: A | Discharge status: Home / Self Care | Payer: Medicare Other | Source: Ambulatory Visit | Attending: Adult Health | Admitting: Adult Health

## 2016-05-14 ENCOUNTER — Other Ambulatory Visit (INDEPENDENT_AMBULATORY_CARE_PROVIDER_SITE_OTHER): Payer: Medicare Other

## 2016-05-14 ENCOUNTER — Inpatient Hospital Stay (HOSPITAL_COMMUNITY)
Admission: EM | Admit: 2016-05-14 | Discharge: 2016-05-22 | DRG: 246 | Disposition: A | Discharge status: Home Health | Payer: Medicare Other | Attending: Internal Medicine | Admitting: Internal Medicine

## 2016-05-14 ENCOUNTER — Inpatient Hospital Stay (HOSPITAL_COMMUNITY): Payer: Medicare Other

## 2016-05-14 VITALS — BP 130/80 | HR 97 | Temp 97.7°F | Ht 70.0 in | Wt 184.0 lb

## 2016-05-14 DIAGNOSIS — E1169 Type 2 diabetes mellitus with other specified complication: Secondary | ICD-10-CM | POA: Diagnosis not present

## 2016-05-14 DIAGNOSIS — Z79899 Other long term (current) drug therapy: Secondary | ICD-10-CM | POA: Diagnosis not present

## 2016-05-14 DIAGNOSIS — R7989 Other specified abnormal findings of blood chemistry: Secondary | ICD-10-CM | POA: Insufficient documentation

## 2016-05-14 DIAGNOSIS — E7800 Pure hypercholesterolemia, unspecified: Secondary | ICD-10-CM | POA: Diagnosis present

## 2016-05-14 DIAGNOSIS — E785 Hyperlipidemia, unspecified: Secondary | ICD-10-CM | POA: Diagnosis present

## 2016-05-14 DIAGNOSIS — I081 Rheumatic disorders of both mitral and tricuspid valves: Secondary | ICD-10-CM | POA: Diagnosis present

## 2016-05-14 DIAGNOSIS — I4891 Unspecified atrial fibrillation: Secondary | ICD-10-CM | POA: Diagnosis present

## 2016-05-14 DIAGNOSIS — J441 Chronic obstructive pulmonary disease with (acute) exacerbation: Secondary | ICD-10-CM | POA: Diagnosis present

## 2016-05-14 DIAGNOSIS — J438 Other emphysema: Secondary | ICD-10-CM | POA: Diagnosis not present

## 2016-05-14 DIAGNOSIS — I214 Non-ST elevation (NSTEMI) myocardial infarction: Principal | ICD-10-CM | POA: Diagnosis present

## 2016-05-14 DIAGNOSIS — Z7984 Long term (current) use of oral hypoglycemic drugs: Secondary | ICD-10-CM | POA: Diagnosis not present

## 2016-05-14 DIAGNOSIS — R079 Chest pain, unspecified: Secondary | ICD-10-CM

## 2016-05-14 DIAGNOSIS — D649 Anemia, unspecified: Secondary | ICD-10-CM | POA: Diagnosis present

## 2016-05-14 DIAGNOSIS — E118 Type 2 diabetes mellitus with unspecified complications: Secondary | ICD-10-CM | POA: Diagnosis not present

## 2016-05-14 DIAGNOSIS — J90 Pleural effusion, not elsewhere classified: Secondary | ICD-10-CM | POA: Insufficient documentation

## 2016-05-14 DIAGNOSIS — R0600 Dyspnea, unspecified: Secondary | ICD-10-CM

## 2016-05-14 DIAGNOSIS — I5041 Acute combined systolic (congestive) and diastolic (congestive) heart failure: Secondary | ICD-10-CM | POA: Diagnosis not present

## 2016-05-14 DIAGNOSIS — I700 Atherosclerosis of aorta: Secondary | ICD-10-CM | POA: Diagnosis present

## 2016-05-14 DIAGNOSIS — I130 Hypertensive heart and chronic kidney disease with heart failure and stage 1 through stage 4 chronic kidney disease, or unspecified chronic kidney disease: Secondary | ICD-10-CM | POA: Diagnosis present

## 2016-05-14 DIAGNOSIS — N189 Chronic kidney disease, unspecified: Secondary | ICD-10-CM

## 2016-05-14 DIAGNOSIS — Z6828 Body mass index (BMI) 28.0-28.9, adult: Secondary | ICD-10-CM | POA: Diagnosis not present

## 2016-05-14 DIAGNOSIS — N179 Acute kidney failure, unspecified: Secondary | ICD-10-CM | POA: Diagnosis present

## 2016-05-14 DIAGNOSIS — J449 Chronic obstructive pulmonary disease, unspecified: Secondary | ICD-10-CM

## 2016-05-14 DIAGNOSIS — D472 Monoclonal gammopathy: Secondary | ICD-10-CM | POA: Diagnosis present

## 2016-05-14 DIAGNOSIS — G4733 Obstructive sleep apnea (adult) (pediatric): Secondary | ICD-10-CM | POA: Diagnosis present

## 2016-05-14 DIAGNOSIS — J8410 Pulmonary fibrosis, unspecified: Secondary | ICD-10-CM | POA: Diagnosis not present

## 2016-05-14 DIAGNOSIS — R778 Other specified abnormalities of plasma proteins: Secondary | ICD-10-CM | POA: Insufficient documentation

## 2016-05-14 DIAGNOSIS — I452 Bifascicular block: Secondary | ICD-10-CM | POA: Diagnosis present

## 2016-05-14 DIAGNOSIS — I509 Heart failure, unspecified: Secondary | ICD-10-CM

## 2016-05-14 DIAGNOSIS — E860 Dehydration: Secondary | ICD-10-CM | POA: Diagnosis present

## 2016-05-14 DIAGNOSIS — I2510 Atherosclerotic heart disease of native coronary artery without angina pectoris: Secondary | ICD-10-CM | POA: Diagnosis present

## 2016-05-14 DIAGNOSIS — J440 Chronic obstructive pulmonary disease with acute lower respiratory infection: Secondary | ICD-10-CM | POA: Diagnosis present

## 2016-05-14 DIAGNOSIS — I714 Abdominal aortic aneurysm, without rupture: Secondary | ICD-10-CM | POA: Diagnosis present

## 2016-05-14 DIAGNOSIS — J432 Centrilobular emphysema: Secondary | ICD-10-CM | POA: Diagnosis not present

## 2016-05-14 DIAGNOSIS — Z7982 Long term (current) use of aspirin: Secondary | ICD-10-CM | POA: Diagnosis not present

## 2016-05-14 DIAGNOSIS — I5020 Unspecified systolic (congestive) heart failure: Secondary | ICD-10-CM | POA: Diagnosis not present

## 2016-05-14 DIAGNOSIS — I5043 Acute on chronic combined systolic (congestive) and diastolic (congestive) heart failure: Secondary | ICD-10-CM | POA: Diagnosis present

## 2016-05-14 DIAGNOSIS — N183 Chronic kidney disease, stage 3 (moderate): Secondary | ICD-10-CM | POA: Diagnosis not present

## 2016-05-14 DIAGNOSIS — Z9981 Dependence on supplemental oxygen: Secondary | ICD-10-CM

## 2016-05-14 DIAGNOSIS — I25110 Atherosclerotic heart disease of native coronary artery with unstable angina pectoris: Secondary | ICD-10-CM | POA: Diagnosis not present

## 2016-05-14 DIAGNOSIS — I10 Essential (primary) hypertension: Secondary | ICD-10-CM | POA: Diagnosis present

## 2016-05-14 DIAGNOSIS — J189 Pneumonia, unspecified organism: Secondary | ICD-10-CM | POA: Diagnosis not present

## 2016-05-14 DIAGNOSIS — R911 Solitary pulmonary nodule: Secondary | ICD-10-CM | POA: Diagnosis not present

## 2016-05-14 DIAGNOSIS — E1122 Type 2 diabetes mellitus with diabetic chronic kidney disease: Secondary | ICD-10-CM | POA: Diagnosis present

## 2016-05-14 DIAGNOSIS — N400 Enlarged prostate without lower urinary tract symptoms: Secondary | ICD-10-CM | POA: Diagnosis present

## 2016-05-14 DIAGNOSIS — I209 Angina pectoris, unspecified: Secondary | ICD-10-CM | POA: Diagnosis not present

## 2016-05-14 DIAGNOSIS — N184 Chronic kidney disease, stage 4 (severe): Secondary | ICD-10-CM | POA: Diagnosis present

## 2016-05-14 DIAGNOSIS — E119 Type 2 diabetes mellitus without complications: Secondary | ICD-10-CM

## 2016-05-14 DIAGNOSIS — Z87891 Personal history of nicotine dependence: Secondary | ICD-10-CM

## 2016-05-14 DIAGNOSIS — J410 Simple chronic bronchitis: Secondary | ICD-10-CM | POA: Diagnosis not present

## 2016-05-14 DIAGNOSIS — J9611 Chronic respiratory failure with hypoxia: Secondary | ICD-10-CM | POA: Diagnosis not present

## 2016-05-14 DIAGNOSIS — I5021 Acute systolic (congestive) heart failure: Secondary | ICD-10-CM | POA: Diagnosis not present

## 2016-05-14 DIAGNOSIS — R0789 Other chest pain: Secondary | ICD-10-CM | POA: Diagnosis not present

## 2016-05-14 DIAGNOSIS — Z955 Presence of coronary angioplasty implant and graft: Secondary | ICD-10-CM

## 2016-05-14 HISTORY — DX: Atherosclerosis of aorta: I70.0

## 2016-05-14 LAB — BASIC METABOLIC PANEL
ANION GAP: 14 (ref 5–15)
BUN: 49 mg/dL — ABNORMAL HIGH (ref 6–23)
BUN: 48 mg/dL — AB (ref 6–20)
CALCIUM: 9.3 mg/dL (ref 8.4–10.5)
CHLORIDE: 105 meq/L (ref 96–112)
CHLORIDE: 106 mmol/L (ref 101–111)
CO2: 21 meq/L (ref 19–32)
CO2: 18 mmol/L — AB (ref 22–32)
Calcium: 9.2 mg/dL (ref 8.9–10.3)
Creatinine, Ser: 2.32 mg/dL — ABNORMAL HIGH (ref 0.40–1.50)
Creatinine, Ser: 2.6 mg/dL — ABNORMAL HIGH (ref 0.61–1.24)
GFR calc Af Amer: 24 mL/min — ABNORMAL LOW (ref >60)
GFR calc non Af Amer: 21 mL/min — ABNORMAL LOW (ref >60)
GFR: 28.63 mL/min — ABNORMAL LOW (ref >60.00)
GLUCOSE: 251 mg/dL — AB (ref 70–99)
GLUCOSE: 250 mg/dL — AB (ref 65–99)
POTASSIUM: 5.1 meq/L (ref 3.5–5.1)
POTASSIUM: 4.8 mmol/L (ref 3.5–5.1)
Sodium: 136 mEq/L (ref 135–145)
Sodium: 138 mmol/L (ref 135–145)

## 2016-05-14 LAB — ECHOCARDIOGRAM COMPLETE
HEIGHTINCHES: 67 in
Weight: 2944 oz

## 2016-05-14 LAB — CBC WITH DIFFERENTIAL/PLATELET
BASOS PCT: 0.2 % (ref 0.0–3.0)
Basophils Absolute: 0 10*3/uL (ref 0.0–0.1)
EOS ABS: 0 10*3/uL (ref 0.0–0.7)
EOS PCT: 0 % (ref 0.0–5.0)
HEMATOCRIT: 34.3 % — AB (ref 39.0–52.0)
Hemoglobin: 11.6 g/dL — ABNORMAL LOW (ref 13.0–17.0)
LYMPHS PCT: 15 % (ref 12.0–46.0)
Lymphs Abs: 1.4 10*3/uL (ref 0.7–4.0)
MCHC: 33.8 g/dL (ref 30.0–36.0)
MCV: 96.9 fl (ref 78.0–100.0)
MONO ABS: 0.4 10*3/uL (ref 0.1–1.0)
Monocytes Relative: 4.4 % (ref 3.0–12.0)
NEUTROS ABS: 7.4 10*3/uL (ref 1.4–7.7)
Neutrophils Relative %: 80.4 % — ABNORMAL HIGH (ref 43.0–77.0)
PLATELETS: 349 10*3/uL (ref 150.0–400.0)
RBC: 3.54 Mil/uL — ABNORMAL LOW (ref 4.22–5.81)
RDW: 13.5 % (ref 11.5–15.5)
WBC: 9.2 10*3/uL (ref 4.0–10.5)

## 2016-05-14 LAB — GLUCOSE, CAPILLARY
GLUCOSE-CAPILLARY: 197 mg/dL — AB (ref 65–99)
Glucose-Capillary: 205 mg/dL — ABNORMAL HIGH (ref 65–99)

## 2016-05-14 LAB — I-STAT TROPONIN, ED: Troponin i, poc: 0.41 ng/mL (ref 0.00–0.08)

## 2016-05-14 LAB — TROPONIN I
TROPONIN I: 0.37 ng/mL — AB (ref <0.031)
Troponin I: 0.33 ng/mL — ABNORMAL HIGH (ref <0.031)

## 2016-05-14 LAB — CBC
HEMATOCRIT: 35.1 % — AB (ref 39.0–52.0)
HEMOGLOBIN: 11.7 g/dL — AB (ref 13.0–17.0)
MCH: 32.6 pg (ref 26.0–34.0)
MCHC: 33.3 g/dL (ref 30.0–36.0)
MCV: 97.8 fL (ref 78.0–100.0)
Platelets: 268 10*3/uL (ref 150–400)
RBC: 3.59 MIL/uL — ABNORMAL LOW (ref 4.22–5.81)
RDW: 13.6 % (ref 11.5–15.5)
WBC: 10.3 10*3/uL (ref 4.0–10.5)

## 2016-05-14 LAB — BRAIN NATRIURETIC PEPTIDE: Pro B Natriuretic peptide (BNP): 1454 pg/mL — ABNORMAL HIGH (ref 0.0–100.0)

## 2016-05-14 LAB — PROCALCITONIN

## 2016-05-14 LAB — MRSA PCR SCREENING: MRSA BY PCR: NEGATIVE

## 2016-05-14 LAB — PROTIME-INR
INR: 1.1 (ref 0.00–1.49)
Prothrombin Time: 14.3 seconds (ref 11.6–15.2)

## 2016-05-14 MED ORDER — SODIUM CHLORIDE 0.9% FLUSH
3.0000 mL | Freq: Two times a day (BID) | INTRAVENOUS | Status: DC
  Administered 2016-05-14 – 2016-05-22 (×13): 3 mL via INTRAVENOUS

## 2016-05-14 MED ORDER — SODIUM CHLORIDE 0.9 % IV SOLN: 250.0000 mL | INTRAVENOUS | Status: DC | PRN

## 2016-05-14 MED ORDER — ONDANSETRON HCL 4 MG/2ML IJ SOLN: 4.0000 mg | Freq: Four times a day (QID) | INTRAMUSCULAR | Status: DC | PRN

## 2016-05-14 MED ORDER — ACETAMINOPHEN 325 MG PO TABS
650.0000 mg | ORAL_TABLET | ORAL | Status: DC | PRN
  Administered 2016-05-14 – 2016-05-15 (×2): 650 mg via ORAL
  Filled 2016-05-14 (×2): qty 2

## 2016-05-14 MED ORDER — SODIUM CHLORIDE 0.9% FLUSH
3.0000 mL | Freq: Two times a day (BID) | INTRAVENOUS | Status: DC
Start: 2016-05-14 — End: 2016-05-15
  Administered 2016-05-14: 3 mL via INTRAVENOUS

## 2016-05-14 MED ORDER — ONDANSETRON HCL 4 MG PO TABS: 4.0000 mg | ORAL_TABLET | Freq: Four times a day (QID) | ORAL | Status: DC | PRN

## 2016-05-14 MED ORDER — SODIUM CHLORIDE 0.9% FLUSH: 3.0000 mL | INTRAVENOUS | Status: DC | PRN

## 2016-05-14 MED ORDER — ALBUTEROL SULFATE (2.5 MG/3ML) 0.083% IN NEBU: 2.5000 mg | INHALATION_SOLUTION | Freq: Four times a day (QID) | RESPIRATORY_TRACT | Status: DC | PRN

## 2016-05-14 MED ORDER — LEVALBUTEROL HCL 0.63 MG/3ML IN NEBU
0.6300 mg | INHALATION_SOLUTION | Freq: Once | RESPIRATORY_TRACT | Status: AC
  Administered 2016-05-14: 0.63 mg via RESPIRATORY_TRACT

## 2016-05-14 MED ORDER — BISOPROLOL FUMARATE 5 MG PO TABS: 5.0000 mg | ORAL_TABLET | Freq: Every day | ORAL | Status: DC

## 2016-05-14 MED ORDER — SODIUM CHLORIDE 0.9 % IV SOLN: INTRAVENOUS | Status: DC

## 2016-05-14 MED ORDER — ASPIRIN 81 MG PO CHEW
81.0000 mg | CHEWABLE_TABLET | ORAL | Status: AC
Start: 2016-05-15 — End: 2016-05-15
  Administered 2016-05-15: 81 mg via ORAL
  Filled 2016-05-14: qty 1

## 2016-05-14 MED ORDER — SIMVASTATIN 20 MG PO TABS
20.0000 mg | ORAL_TABLET | Freq: Every day | ORAL | Status: DC
  Administered 2016-05-14 – 2016-05-21 (×8): 20 mg via ORAL
  Filled 2016-05-14 (×8): qty 1

## 2016-05-14 MED ORDER — AMOXICILLIN-POT CLAVULANATE 500-125 MG PO TABS
1.0000 | ORAL_TABLET | Freq: Two times a day (BID) | ORAL | Status: AC
  Administered 2016-05-14 – 2016-05-18 (×9): 500 mg via ORAL
  Filled 2016-05-14 (×12): qty 1

## 2016-05-14 MED ORDER — SODIUM CHLORIDE 0.9 % IV SOLN
INTRAVENOUS | Status: DC
  Administered 2016-05-14 – 2016-05-15 (×2): via INTRAVENOUS

## 2016-05-14 MED ORDER — ISOSORBIDE DINITRATE 10 MG PO TABS
10.0000 mg | ORAL_TABLET | Freq: Two times a day (BID) | ORAL | Status: DC
Start: 2016-05-14 — End: 2016-05-22
  Administered 2016-05-14 – 2016-05-22 (×16): 10 mg via ORAL
  Filled 2016-05-14 (×16): qty 1

## 2016-05-14 MED ORDER — HEPARIN BOLUS VIA INFUSION
4000.0000 [IU] | Freq: Once | INTRAVENOUS | Status: AC
  Administered 2016-05-14: 4000 [IU] via INTRAVENOUS
  Filled 2016-05-14: qty 4000

## 2016-05-14 MED ORDER — CARVEDILOL 3.125 MG PO TABS
3.1250 mg | ORAL_TABLET | Freq: Two times a day (BID) | ORAL | Status: DC
  Administered 2016-05-14 – 2016-05-17 (×6): 3.125 mg via ORAL
  Filled 2016-05-14 (×6): qty 1

## 2016-05-14 MED ORDER — INSULIN ASPART 100 UNIT/ML ~~LOC~~ SOLN
0.0000 [IU] | Freq: Every day | SUBCUTANEOUS | Status: DC
  Administered 2016-05-15: 0 [IU] via SUBCUTANEOUS
  Administered 2016-05-16 – 2016-05-17 (×2): 2 [IU] via SUBCUTANEOUS

## 2016-05-14 MED ORDER — HYDROCODONE-ACETAMINOPHEN 5-325 MG PO TABS
1.0000 | ORAL_TABLET | ORAL | Status: DC | PRN
  Administered 2016-05-16: 1 via ORAL
  Filled 2016-05-14: qty 1

## 2016-05-14 MED ORDER — FUROSEMIDE 10 MG/ML IJ SOLN
20.0000 mg | Freq: Once | INTRAMUSCULAR | Status: AC
  Administered 2016-05-14: 20 mg via INTRAVENOUS
  Filled 2016-05-14: qty 2

## 2016-05-14 MED ORDER — HEPARIN (PORCINE) IN NACL 100-0.45 UNIT/ML-% IJ SOLN
1000.0000 [IU]/h | INTRAMUSCULAR | Status: DC
  Administered 2016-05-14 – 2016-05-15 (×2): 1000 [IU]/h via INTRAVENOUS
  Filled 2016-05-14 (×2): qty 250

## 2016-05-14 MED ORDER — TAMSULOSIN HCL 0.4 MG PO CAPS
0.4000 mg | ORAL_CAPSULE | Freq: Every day | ORAL | Status: DC
  Administered 2016-05-14 – 2016-05-21 (×8): 0.4 mg via ORAL
  Filled 2016-05-14 (×8): qty 1

## 2016-05-14 MED ORDER — INSULIN ASPART 100 UNIT/ML ~~LOC~~ SOLN
0.0000 [IU] | Freq: Three times a day (TID) | SUBCUTANEOUS | Status: DC
  Administered 2016-05-15: 2 [IU] via SUBCUTANEOUS
  Administered 2016-05-15: 3 [IU] via SUBCUTANEOUS
  Administered 2016-05-16 (×3): 2 [IU] via SUBCUTANEOUS
  Administered 2016-05-17: 1 [IU] via SUBCUTANEOUS
  Administered 2016-05-17: 2 [IU] via SUBCUTANEOUS

## 2016-05-14 MED ORDER — SODIUM CHLORIDE 0.9 % WEIGHT BASED INFUSION
3.0000 mL/kg/h | INTRAVENOUS | Status: DC
  Administered 2016-05-15: 3 mL/kg/h via INTRAVENOUS

## 2016-05-14 MED ORDER — AMOXICILLIN-POT CLAVULANATE 875-125 MG PO TABS: 1.0000 | ORAL_TABLET | Freq: Every day | ORAL | Status: DC

## 2016-05-14 MED ORDER — MORPHINE SULFATE (PF) 2 MG/ML IV SOLN
2.0000 mg | INTRAVENOUS | Status: DC | PRN
  Administered 2016-05-15 – 2016-05-16 (×3): 2 mg via INTRAVENOUS
  Filled 2016-05-14 (×3): qty 1

## 2016-05-14 MED ORDER — AMLODIPINE BESYLATE 2.5 MG PO TABS
2.5000 mg | ORAL_TABLET | Freq: Every day | ORAL | Status: DC
  Administered 2016-05-14 – 2016-05-17 (×4): 2.5 mg via ORAL
  Filled 2016-05-14 (×4): qty 1

## 2016-05-14 MED ORDER — SODIUM CHLORIDE 0.9 % WEIGHT BASED INFUSION: 1.0000 mL/kg/h | INTRAVENOUS | Status: DC

## 2016-05-14 MED ORDER — ASPIRIN EC 81 MG PO TBEC
81.0000 mg | DELAYED_RELEASE_TABLET | Freq: Every day | ORAL | Status: DC
  Administered 2016-05-14 – 2016-05-17 (×4): 81 mg via ORAL
  Filled 2016-05-14 (×4): qty 1

## 2016-05-14 MED ORDER — ASPIRIN 81 MG PO CHEW
324.0000 mg | CHEWABLE_TABLET | Freq: Once | ORAL | Status: AC
  Administered 2016-05-14: 324 mg via ORAL
  Filled 2016-05-14: qty 4

## 2016-05-14 NOTE — Patient Instructions (Addendum)
Refer to ER at [facility] .

## 2016-05-14 NOTE — ED Provider Notes (Addendum)
CSN: [id number]     Arrival date & time 05/14/16  1240 History   First MD Initiated Contact with Patient 05/14/16 1323     Chief Complaint  Patient presents with  . Abnormal ECG  . Chest Pain     (Consider location/radiation/quality/duration/timing/severity/associated sxs/prior Treatment) HPI 82 y.o. Male presents complaining of sscp for 3 weeks.  Seen last week at pulmonary doctors office and treated with abx.  Patient with sob associated with chest pain.  Yesterday sscp and dyspnea with any exertion.  Patient went to Dr. Agustina Caroli office, cxr and told to come to ed.  Current chest pressure 2/10, worst has been 7/10.  Patient took aspirin last night. Past Medical History  Diagnosis Date  . Diabetes mellitus   . High blood pressure   . High cholesterol   . CHF (congestive heart failure) ([place])   . Emphysema   . Joint pain   . AAA (abdominal aortic aneurysm) ([place])   . COPD (chronic obstructive pulmonary disease) ([place])   . Chronic kidney disease   . Irregular heart beat   . PVC (premature ventricular contraction)   . Shingles   . Chronic renal disease, stage III   . Sleep apnea     cpap  . Irregular heart beat   . CAD (coronary artery disease)   . Chronic diastolic heart failure ([place]) 07/24/2011  . Atrial fibrillation [facility])    Past Surgical History  Procedure Laterality Date  . Penile prosthesis implant  09-2005  . Tonsillectomy    . Urethrotomy     Family History  Problem Relation Age of Onset  . Other Father     AAA  . Heart disease Father     After age 76,   AAA  . Hypertension Father   . Hyperlipidemia Father   . Heart attack Father   . Diabetes Mother     amputation  . Heart disease Mother   . Hypertension Mother   . Hyperlipidemia Mother   . Deep vein thrombosis Mother   . Heart attack Mother   . Diabetes Sister   . Heart disease Sister     Heart Disease before age 6  . Hypertension Sister   . Hyperlipidemia Sister   . Diabetes Brother   . Heart disease  Brother   . Hypertension Brother   . Hyperlipidemia Brother   . Cancer Daughter    Social History  Substance Use Topics  . Smoking status: Former Smoker -- 3.00 packs/day for 30 years    Types: Cigarettes    Quit date: 12/31/1985  . Smokeless tobacco: Former Systems developer    Types: Chew     Comment: "little chewing tobacco"  . Alcohol Use: No     Comment: quit in 1985    Review of Systems  All other systems reviewed and are negative.     Allergies  Review of patient's allergies indicates no known allergies.  Home Medications   Prior to Admission medications   Medication Sig Start Date End Date Taking? Authorizing Provider  albuterol (PROVENTIL HFA;VENTOLIN HFA) 108 (90 Base) MCG/ACT inhaler Inhale 2 puffs into the lungs every 6 (six) hours as needed for wheezing or shortness of breath. 01/09/16   Collene Gobble, MD  AMLODIPINE BESYLATE PO Take 1 tablet by mouth daily.     Historical Provider, MD  Ascorbic Acid (VITAMIN C) 1000 MG tablet Take 1,000 mg by mouth daily.    Historical Provider, MD  aspirin EC 81 MG tablet  Take 81 mg by mouth at bedtime.     Historical Provider, MD  BAYER CONTOUR TEST test strip  11/07/12   Historical Provider, MD  carvedilol (COREG) 3.125 MG tablet Take 3.125 mg by mouth 2 (two) times daily with a meal.    Historical Provider, MD  cephALEXin (KEFLEX) 500 MG capsule  03/06/16   Historical Provider, MD  Chromium Picolinate 800 MCG TABS Take 2 tablets by mouth daily.      Historical Provider, MD  Coenzyme Q10 (CO Q-10) 100 MG CAPS Take 100 mg by mouth at bedtime.     Historical Provider, MD  furosemide (LASIX) 20 MG tablet Take 20 mg by mouth 2 (two) times daily.    Historical Provider, MD  glipiZIDE (GLUCOTROL XL) 10 MG 24 hr tablet Take 10 mg by mouth at bedtime.    Historical Provider, MD  glipiZIDE (GLUCOTROL XL) 2.5 MG 24 hr tablet Take 2.5 mg by mouth daily.    Historical Provider, MD  HYDROcodone-acetaminophen (NORCO/VICODIN) 5-325 MG per tablet 1 tablet  as needed. 10/26/13   Historical Provider, MD  metFORMIN (GLUCOPHAGE) 1000 MG tablet Take 1,000 mg by mouth 2 (two) times daily with a meal.    Historical Provider, MD  predniSONE (DELTASONE) 10 MG tablet 4 tabs for 2 days, then 3 tabs for 2 days, 2 tabs for 2 days, then 1 tab for 2 days, then stop 05/09/16   Tammy S Parrett, NP  simvastatin (ZOCOR) 20 MG tablet Take 20 mg by mouth at bedtime.  10/21/12   Historical Provider, MD  Tamsulosin HCl (FLOMAX) 0.4 MG CAPS Take 0.4 mg by mouth at bedtime.  10/21/12   Historical Provider, MD  zinc gluconate 50 MG tablet Take 50 mg by mouth at bedtime.     Historical Provider, MD   BP 158/88 mmHg  Pulse 97  Temp(Src) 97.7 F (36.5 C) (Oral)  Resp 15  Ht '5\' 7"'$  (1.702 m)  Wt 83.462 kg  BMI 28.81 kg/m2  SpO2 97% Physical Exam  Constitutional: He is oriented to person, place, and time. He appears well-developed and well-nourished. No distress.  HENT:  Head: Normocephalic and atraumatic.  Eyes: Pupils are equal, round, and reactive to light.  Neck: Normal range of motion. Neck supple.  Cardiovascular: Normal rate, regular rhythm, normal heart sounds and intact distal pulses.   Pulmonary/Chest: Effort normal and breath sounds normal.  Abdominal: Soft. Bowel sounds are normal.  Musculoskeletal: Normal range of motion. He exhibits no edema or tenderness.  Neurological: He is alert and oriented to person, place, and time.  Skin: Skin is warm and dry.  Psychiatric: He has a normal mood and affect.  Nursing note and vitals reviewed.   ED Course  Procedures (including critical care time) Labs Review Labs Reviewed  BASIC METABOLIC PANEL - Abnormal; Notable for the following:    CO2 18 (*)    Glucose, Bld 250 (*)    BUN 48 (*)    Creatinine, Ser 2.60 (*)    GFR calc non Af Amer 21 (*)    GFR calc Af Amer 24 (*)    All other components within normal limits  CBC - Abnormal; Notable for the following:    RBC 3.59 (*)    Hemoglobin 11.7 (*)    HCT  35.1 (*)    All other components within normal limits  I-STAT TROPOININ, ED - Abnormal; Notable for the following:    Troponin i, poc 0.41 (*)    All  other components within normal limits    Imaging Review Dg Chest 2 View  05/14/2016  CLINICAL DATA:  Shortness of breath, cough/ congestion, substernal chest pain x3 weeks EXAM: CHEST  2 VIEW COMPARISON:  05/09/2016 FINDINGS: Chronic interstitial markings with subpleural reticulation/fibrosis, particularly in the right mid lung and left lower lobe. Appearance is similar when compared to 2015, although superimposed bibasilar infection/pneumonia is not entirely excluded. Trace left pleural effusion. No pneumothorax. The heart is normal in size. Degenerative changes of the visualized thoracolumbar spine. IMPRESSION: Chronic interstitial lung disease. Superimposed mild bibasilar infection/pneumonia is possible. Trace left pleural effusion. Electronically Signed   By: Julian Hy M.D.   On: 05/14/2016 10:27   I have personally reviewed and evaluated these images and lab results as part of my medical decision-making.   EKG Interpretation   Date/Time:  Monday May 14 2016 12:44:29 EDT Ventricular Rate:  104 PR Interval:  200 QRS Duration: 150 QT Interval:  388 QTC Calculation: 510 R Axis:   125 Text Interpretation:  Sinus tachycardia with Premature atrial complexes  Right bundle branch block Left posterior fascicular block Bifascicular  block T wave abnormality, consider inferolateral ischemia Abnormal ECG  Confirmed by Jordane Hisle MD, Andee Poles ([id number]) on 05/14/2016 1:37:04 PM      MDM   Final diagnoses:  NSTEMI (non-ST elevated myocardial infarction) ([place])  Acute on chronic congestive heart failure, unspecified congestive heart failure type [facility])    82 year old man with multiple health problems presents today with chest pain intermittently for 3 weeks which was worse yesterday. EKG shows right bundle branch block and left posterior fascicular  block with lateral ST depression and troponin positive at 0.41. Patient is having 2 out of 10 discomfort at this time. He is given aspirin and started on heparin. Some increased fluid noted on chest x-[place] with elevated BNP. Hemodynamically he appears stable with sats 97%, blood pressure 130/80 and afebrile. Cardiology is being consulted. Discussed with Trish and cardiology to see.   Pattricia Boss, MD 05/14/16 1436  Cardiology requests hospitalist admission. Patient put into hospice.  Pattricia Boss, MD 05/14/16 [phone]

## 2016-05-14 NOTE — Progress Notes (Signed)
Pharmacy note: sodium bicarbonate  Patient ordered Sodium bicarbonate for contrast induced nephropathy however there is a Producer, television/film/video of sodium bicarbonate. The Society of Critical Care Medicine recommends use of 0.9% NS for contrast induced nephropathy during the shortage.  Plan Will utilize 0.9% NS per cath protocol (77m/kg/hr for 1 hr then '1mg'$ /kg/hr) Please contact pharmacy with any questions.  Thank you,  AHildred Laser Pharm D 05/14/2016 9:13 PM

## 2016-05-14 NOTE — Progress Notes (Signed)
Subjective:    [name], male    DOB: 1930/06/30, 82 y.o.   MRN: [id number]  HPI  82 year old male former smoker with COPD and interstitial lung disease , oxygen dependent with activity  05/14/2016 Acute OV :  Patient returns for persistent symptoms . Complains of chest tightness/congestion, prod cough with clear mucus, SOB and wheezing. Denies any sinus pressure/drainage, fever, nausea or vomiting.  Says he only feels slightly better. Continues to have chest tightness with inspiration. However is more central /epigastrict in nature . That comes and goes.   He was seen in office 5/5 with 2 weeks of cough., congestion , pleuritic pain. He was started on Augmentin and prednisone taper.  CXR last ov showed chronic interstitial changes with possible LLL infiltrate.   Denies any sinus pressure/drainage, fever, nausea or vomiting. , hemoptysis , orthopnea, edema .  Appetite is okay .  He denies any exertional chest pain, palpitations, syncope. EKG shows first degree AV Block /R BBB similar to previous EKG , Anterolateral ST depression is more prominent on EKG today compared to 2014 comparison.    Seen by Renal , Augmentin changed to daily dosing due to chronic kidney dz.  Has few days of prednisone left.   Pt did have a Barium Swallow on 04/02/16 , mild smooth distal esophageal stricture, failed swallow of tablet, with abnormal esophageal motility with poor primary propulsion.     Past Medical History  Diagnosis Date  . Diabetes mellitus   . High blood pressure   . High cholesterol   . CHF (congestive heart failure) ([place])   . Emphysema   . Joint pain   . AAA (abdominal aortic aneurysm) ([place])   . COPD (chronic obstructive pulmonary disease) ([place])   . Chronic kidney disease   . Irregular heart beat   . PVC (premature ventricular contraction)   . Shingles   . Chronic renal disease, stage III   . Sleep apnea     cpap  . Irregular heart beat   . CAD (coronary artery  disease)   . Chronic diastolic heart failure ([place]) 07/24/2011  . Atrial fibrillation [facility])    Current Outpatient Prescriptions on File Prior to Visit  Medication Sig Dispense Refill  . albuterol (PROVENTIL HFA;VENTOLIN HFA) 108 (90 Base) MCG/ACT inhaler Inhale 2 puffs into the lungs every 6 (six) hours as needed for wheezing or shortness of breath. 1 Inhaler 2  . AMLODIPINE BESYLATE PO Take 1 tablet by mouth daily.     . Ascorbic Acid (VITAMIN C) 1000 MG tablet Take 1,000 mg by mouth daily.    Marland Kitchen aspirin EC 81 MG tablet Take 81 mg by mouth at bedtime.     Marland Kitchen BAYER CONTOUR TEST test strip     . carvedilol (COREG) 3.125 MG tablet Take 3.125 mg by mouth 2 (two) times daily with a meal.    . cephALEXin (KEFLEX) 500 MG capsule     . Chromium Picolinate 800 MCG TABS Take 2 tablets by mouth daily.      . Coenzyme Q10 (CO Q-10) 100 MG CAPS Take 100 mg by mouth at bedtime.     . furosemide (LASIX) 20 MG tablet Take 20 mg by mouth 2 (two) times daily.    Marland Kitchen glipiZIDE (GLUCOTROL XL) 10 MG 24 hr tablet Take 10 mg by mouth at bedtime.    Marland Kitchen glipiZIDE (GLUCOTROL XL) 2.5 MG 24 hr tablet Take 2.5 mg by mouth daily.    Marland Kitchen  HYDROcodone-acetaminophen (NORCO/VICODIN) 5-325 MG per tablet 1 tablet as needed.    . metFORMIN (GLUCOPHAGE) 1000 MG tablet Take 1,000 mg by mouth 2 (two) times daily with a meal.    . predniSONE (DELTASONE) 10 MG tablet 4 tabs for 2 days, then 3 tabs for 2 days, 2 tabs for 2 days, then 1 tab for 2 days, then stop 20 tablet 0  . simvastatin (ZOCOR) 20 MG tablet Take 20 mg by mouth at bedtime.     . Tamsulosin HCl (FLOMAX) 0.4 MG CAPS Take 0.4 mg by mouth at bedtime.     Marland Kitchen zinc gluconate 50 MG tablet Take 50 mg by mouth at bedtime.      No current facility-administered medications on file prior to visit.      Review of Systems  Constitutional:   No  weight loss, night sweats,  Fevers, chills, + fatigue, or  lassitude.  HEENT:   No headaches,  Difficulty swallowing,  Tooth/dental  problems, or  Sore throat,                No sneezing, itching, ear ache, nasal congestion, post nasal drip,   CV:  No  Orthopnea, PND, swelling in lower extremities, anasarca, dizziness, palpitations, syncope.   GI  No heartburn, indigestion, abdominal pain, nausea, vomiting, diarrhea, change in bowel habits, loss of appetite, bloody stools.   Resp:   No chest wall deformity  Skin: no rash or lesions.  GU: no dysuria, change in color of urine, no urgency or frequency.  No flank pain, no hematuria   MS:  No joint pain or swelling.  No decreased range of motion.  No back pain.  Psych:  No change in mood or affect. No depression or anxiety.  No memory loss.         Objective:   Physical Exam  Filed Vitals:   05/14/16 0930  BP: 130/80  Pulse: 97  Temp: 97.7 F (36.5 C)  TempSrc: Oral  Height: '5\' 10"'$  (1.778 m)  Weight: 184 lb (83.462 kg)  SpO2: 93%   GEN: A/Ox3; pleasant , NAD, Elderly  HEENT:  [place]/AT,  EACs-clear, TMs-wnl, NOSE-clear, THROAT-clear, no lesions, no postnasal drip or exudate noted.   NECK:  Supple w/ fair ROM; no JVD; normal carotid impulses w/o bruits; no thyromegaly or nodules palpated; no lymphadenopathy.  RESP  decreased breath sounds in the bases no accessory muscle use, no dullness to percussion  CARD:  RRR, no m/r/g  , no peripheral edema, pulses intact, no cyanosis or clubbing.  GI:   Soft & nt; nml bowel sounds; no organomegaly or masses detected.  Musco: Warm bil, no deformities or joint swelling noted.   Neuro: alert, no focal deficits noted.    Skin: Warm, no lesions or rashes  EKG 05/14/2016  More prominent ST depression noted ant/lat.  R BBB , first degree AV block.   CXR 05/14/2016  Chronic interstital changes w/ possible LLL infiltrate   Sajid Ruppert NP-C  [facility]  05/14/2016        Assessment & Plan:

## 2016-05-14 NOTE — Assessment & Plan Note (Signed)
Atypical chest pain with EKG changes  Will refer to ER for further evaluation .  Advised on EMS transport. -pt refuses. But does agree to to go to ER at [facility].  BNP is pending .

## 2016-05-14 NOTE — Assessment & Plan Note (Signed)
Slow to resolve flare with possible LLL PNA -  Finish out abx and steroids .  Will need cxr in 3 weeks to check for clearance.

## 2016-05-14 NOTE — Progress Notes (Signed)
  Echocardiogram 2D Echocardiogram has been performed.  Alan Ford 05/14/2016, 5:16 PM

## 2016-05-14 NOTE — H&P (Signed)
History and Physical    Alan Ford WCB:[id number] DOB: 05/29/1930 DOA: 05/14/2016  PCP: Mathews Argyle, MD Patient coming from: Pulmonology office. - NP Tammy Parrett  Chief Complaint: CP  HPI: Alan Ford is a 82 y.o. male with medical history significant of diabetes, HTN, HLD, CHF, emphysema/COPD, C KD, A. fib, OSA on CPAP, diastolic CHF, presenting with 3 history of chest pain. Substernal. Intermittent. Gradual onset. The first patient thought this was more pleurisy if he's had it before with lung infections. Patient was seen by his pulmonologist and started on antibiotics as well as steroids for possible lung infection. Patient states his breathing felt significantly improved on the steroids. There is some concern over his deteriorating renal function and his nephrologist told him to decrease the dose of his antibiotic but lengthen the amount of days he takes it. Patient's chest pain was unchanged with the above treatment and was associated with worsening shortness of breath with exertion. Patient was seen again by his pulmonologist prior to his presenting to the emergency room who felt that he had persistent pneumonia but also found changes on his EKG. Patient states that his chest pain is unchanged with deep respirations or with movement, occasionally radiates to his back, it is not associated with diaphoresis, nausea or palpitations. Outside of the above complaints patient states he's been in his normal state of health and denies any fevers, dysuria, frequency, back pain, rash, neck stiffness, headaches, nausea, vomiting, diarrhea, constipation. Patient states that his sugars have increased since starting the steroids have been in the high 200 range.  ED Course: Patient found to have an NSTEMI. Cardiology was consulted and patient was immediately started on a heparin drip.  Review of Systems: As per HPI otherwise 10 point review of systems negative.   Ambulatory Status: ambulatory at  baseline  Past Medical History  Diagnosis Date  . Diabetes mellitus   . High blood pressure   . High cholesterol   . CHF (congestive heart failure) ([place])   . Emphysema   . Joint pain   . AAA (abdominal aortic aneurysm) ([place])   . COPD (chronic obstructive pulmonary disease) ([place])   . Chronic kidney disease   . Irregular heart beat   . PVC (premature ventricular contraction)   . Shingles   . Chronic renal disease, stage III   . Sleep apnea     cpap  . Irregular heart beat   . CAD (coronary artery disease)   . Chronic diastolic heart failure ([place]) 07/24/2011  . Atrial fibrillation [facility])     Past Surgical History  Procedure Laterality Date  . Penile prosthesis implant  09-2005  . Tonsillectomy    . Urethrotomy       reports that he quit smoking about 30 years ago. His smoking use included Cigarettes. He has a 90 pack-year smoking history. He has quit using smokeless tobacco. His smokeless tobacco use included Chew. He reports that he does not drink alcohol or use illicit drugs.  No Known Allergies  Family History  Problem Relation Age of Onset  . Other Father     AAA  . Heart disease Father     After age 67,   AAA  . Hypertension Father   . Hyperlipidemia Father   . Heart attack Father   . Diabetes Mother     amputation  . Heart disease Mother   . Hypertension Mother   . Hyperlipidemia Mother   . Deep vein thrombosis Mother   .  Heart attack Mother   . Diabetes Sister   . Heart disease Sister     Heart Disease before age 72  . Hypertension Sister   . Hyperlipidemia Sister   . Diabetes Brother   . Heart disease Brother   . Hypertension Brother   . Hyperlipidemia Brother   . Cancer Daughter     Prior to Admission medications   Medication Sig Start Date End Date Taking? Authorizing Provider  albuterol (PROVENTIL HFA;VENTOLIN HFA) 108 (90 Base) MCG/ACT inhaler Inhale 2 puffs into the lungs every 6 (six) hours as needed for wheezing or shortness of breath.  01/09/16  Yes Collene Gobble, MD  AMLODIPINE BESYLATE PO Take 1 tablet by mouth daily.    Yes Historical Provider, MD  Ascorbic Acid (VITAMIN C) 1000 MG tablet Take 1,000 mg by mouth daily.   Yes Historical Provider, MD  aspirin EC 81 MG tablet Take 81 mg by mouth at bedtime.    Yes Historical Provider, MD  carvedilol (COREG) 3.125 MG tablet Take 3.125 mg by mouth 2 (two) times daily with a meal.   Yes Historical Provider, MD  cephALEXin (KEFLEX) 500 MG capsule Take 500 mg by mouth daily.  03/06/16  Yes Historical Provider, MD  Chromium Picolinate 800 MCG TABS Take 2 tablets by mouth daily.     Yes Historical Provider, MD  Coenzyme Q10 (CO Q-10) 100 MG CAPS Take 100 mg by mouth at bedtime.    Yes Historical Provider, MD  furosemide (LASIX) 20 MG tablet Take 20 mg by mouth 2 (two) times daily.   Yes Historical Provider, MD  glipiZIDE (GLUCOTROL XL) 10 MG 24 hr tablet Take 10 mg by mouth at bedtime.   Yes Historical Provider, MD  glipiZIDE (GLUCOTROL XL) 2.5 MG 24 hr tablet Take 2.5 mg by mouth daily.   Yes Historical Provider, MD  HYDROcodone-acetaminophen (NORCO/VICODIN) 5-325 MG per tablet 1 tablet as needed. 10/26/13  Yes Historical Provider, MD  metFORMIN (GLUCOPHAGE) 1000 MG tablet Take 1,000 mg by mouth 2 (two) times daily with a meal.   Yes Historical Provider, MD  predniSONE (DELTASONE) 10 MG tablet 4 tabs for 2 days, then 3 tabs for 2 days, 2 tabs for 2 days, then 1 tab for 2 days, then stop 05/09/16  Yes Tammy S Parrett, NP  simvastatin (ZOCOR) 20 MG tablet Take 20 mg by mouth at bedtime.  10/21/12  Yes Historical Provider, MD  Tamsulosin HCl (FLOMAX) 0.4 MG CAPS Take 0.4 mg by mouth at bedtime.  10/21/12  Yes Historical Provider, MD  zinc gluconate 50 MG tablet Take 50 mg by mouth at bedtime.    Yes Historical Provider, MD  BAYER CONTOUR TEST test strip  11/07/12   Historical Provider, MD    Physical Exam: Filed Vitals:   05/14/16 1330 05/14/16 1415 05/14/16 1445 05/14/16 1507  BP: 158/88  131/86 156/91   Pulse: 97 98 99   Temp:    97 F (36.1 C)  TempSrc:    Oral  Resp: '15 18 20   '$ Height:      Weight:      SpO2: 97% 96% 96%       Constitutional: NAD, calm, comfortable Eyes:  PERRL, lids and conjunctivae normal ENMT:  Mucous membranes are moist. Posterior pharynx clear of any exudate or lesions.  Neck:  normal, supple, no masses, no thyromegaly Respiratory:  clear to auscultation bilaterally, no wheezing, no crackles. Patient has marked increased respiratory effort with even slight movements in  bed. Cardiovascular:  Regular rate and rhythm, no murmurs / rubs / gallops. No extremity edema. 2+ pedal pulses. No carotid bruits.  Abdomen:  no tenderness, no masses palpated. No hepatosplenomegaly. Bowel sounds positive.  Musculoskeletal:  no clubbing / cyanosis. No joint deformity upper and lower extremities. Good ROM, no contractures. Normal muscle tone.  Skin:  no rashes, lesions, ulcers. No induration Neurologic:  CN 2-12 grossly intact. Sensation intact, Strength 5/5 in all 4.  Psychiatric:  Normal judgment and insight. Alert and oriented x 3. Normal mood.    Labs on Admission: I have personally reviewed following labs and imaging studies  CBC:  Recent Labs Lab 05/14/16 1012 05/14/16 1251  WBC 9.2 10.3  NEUTROABS 7.4  --   HGB 11.6* 11.7*  HCT 34.3* 35.1*  MCV 96.9 97.8  PLT 349.0 355   Basic Metabolic Panel:  Recent Labs Lab 05/14/16 1012 05/14/16 1251  NA 136 138  K 5.1 4.8  CL 105 106  CO2 21 18*  GLUCOSE 251* 250*  BUN 49* 48*  CREATININE 2.32* 2.60*  CALCIUM 9.3 9.2   GFR: Estimated Creatinine Clearance: 21.9 mL/min (by C-G formula based on Cr of 2.6). Liver Function Tests: No results for input(s): AST, ALT, ALKPHOS, BILITOT, PROT, ALBUMIN in the last 168 hours. No results for input(s): LIPASE, AMYLASE in the last 168 hours. No results for input(s): AMMONIA in the last 168 hours. Coagulation Profile: No results for input(s): INR,  PROTIME in the last 168 hours. Cardiac Enzymes: No results for input(s): CKTOTAL, CKMB, CKMBINDEX, TROPONINI in the last 168 hours. BNP (last 3 results)  Recent Labs  05/14/16 1012  PROBNP 1454.0*   HbA1C: No results for input(s): HGBA1C in the last 72 hours. CBG: No results for input(s): GLUCAP in the last 168 hours. Lipid Profile: No results for input(s): CHOL, HDL, LDLCALC, TRIG, CHOLHDL, LDLDIRECT in the last 72 hours. Thyroid Function Tests: No results for input(s): TSH, T4TOTAL, FREET4, T3FREE, THYROIDAB in the last 72 hours. Anemia Panel: No results for input(s): VITAMINB12, FOLATE, FERRITIN, TIBC, IRON, RETICCTPCT in the last 72 hours. Urine analysis:    Component Value Date/Time   COLORURINE YELLOW 10/06/2009 0923   APPEARANCEUR CLOUDY* 10/06/2009 0923   LABSPEC 1.014 10/06/2009 0923   PHURINE 5.5 10/06/2009 0923   GLUCOSEU NEGATIVE 10/06/2009 0923   HGBUR TRACE* 10/06/2009 0923   BILIRUBINUR NEGATIVE 10/06/2009 0923   KETONESUR TRACE* 10/06/2009 0923   PROTEINUR 100* 10/06/2009 0923   UROBILINOGEN 0.2 10/06/2009 0923   NITRITE NEGATIVE 10/06/2009 0923   LEUKOCYTESUR SMALL* 10/06/2009 0923    Creatinine Clearance: Estimated Creatinine Clearance: 21.9 mL/min (by C-G formula based on Cr of 2.6).  Sepsis Labs: '@LABRCNTIP'$ (procalcitonin:4,lacticidven:4) )No results found for this or any previous visit (from the past 240 hour(s)).   Radiological Exams on Admission: Dg Chest 2 View  05/14/2016  CLINICAL DATA:  Shortness of breath, cough/ congestion, substernal chest pain x3 weeks EXAM: CHEST  2 VIEW COMPARISON:  05/09/2016 FINDINGS: Chronic interstitial markings with subpleural reticulation/fibrosis, particularly in the right mid lung and left lower lobe. Appearance is similar when compared to 2015, although superimposed bibasilar infection/pneumonia is not entirely excluded. Trace left pleural effusion. No pneumothorax. The heart is normal in size. Degenerative  changes of the visualized thoracolumbar spine. IMPRESSION: Chronic interstitial lung disease. Superimposed mild bibasilar infection/pneumonia is possible. Trace left pleural effusion. Electronically Signed   By: Julian Hy M.D.   On: 05/14/2016 10:27      Assessment/Plan Active Problems:  NSTEMI (non-ST elevated myocardial infarction) ([place])   NSTEMI: Elevated troponin with EKG showing right bundle branch block and ST depression in the lateral leads. Distant clinic after a sensation. Myoview in 2015 showing no significant vascular disease. Cardiology consult and managing. - Stepdown/telemetry - Heparin drip - Management/workup per cardiology (possible cardiac catheterization) - Echo - continue statin, ASA  Bronchitis/COPD exacerbation: O2 depnedent at baseline. Lungs clear to auscultation at time of presentation. Pt w/ 2 days left of 7 day steroid taper. Pt was on Augmentin prior to arrival. CXR w/ possible bibasliar pneumonia noted. AFVSS. Question pulmonary congestion from CHF. - Complete Augmentin - Hold Steroids - CHF/Diuresis as below - O2 prn  CHF: Pt possibly fluid overloaded given pulmonary findings Trop/EKG. H/o EF 55% and grade 2 diastolic dysfunction. Dry wt 181 but 184 on admission. Lasix 20IV given in ED (home lasix '20mg'$  po BID) - Hold diuresis per cardiology for possible cath - Echo - continue bblocker  AoCKD: Cr 2.6, baseline 2. Likely from recent infection, dehydration. Increased diuresis dosing ordered by ED.  - BMET in am  DM: on orals only - SSI - A1c  HTN: - continue coreg, Norvasc  HLD: - continue statin  BPH: - continue flomax  DVT prophylaxis: Heparin Drip  Code Status: FULL  Family Communication: wife  Disposition Plan: pending improvement and resolution of NSTEMI  Consults called: Cards  Admission status: Stepdown - inpt    MERRELL, DAVID J MD [facility]  If 7PM-7AM, please contact night-coverage [email or web address] Password  [facility]  05/14/2016, 3:38 PM

## 2016-05-14 NOTE — Consult Note (Signed)
CARDIOLOGY CONSULT NOTE   Patient ID: BENIGNO CHECK MRN: [id number] DOB/AGE: 1930-07-09 82 y.o.  Admit date: 05/14/2016  Primary Physician   Mathews Argyle, MD Primary Cardiologist   [place] Reason for Consultation     HPI: Mr. Shaff is an 82 yo male with PMHx of AAA, chronic HFpEF, COPD, T2DM, HTN, MGUS, and OSA who presents to the ED with complaint of chest pain. He has been Rx for pneumonia since last [place]. Seen by Carolynn Serve on two occasions. Previous heavy smoker with COPD on 3L oxygen at home Also wears CPAP.  CRF with MGUS and Cr around 2.5. Had normal myovue 2015 with Dr Marlou Porch. Does have vascular disease with stable AAA around 4.1 cm followed by Dr Donnetta Hutching.  Pain is atypical central chest to epigastric agrea on / off all the time for the last 3 weeks. No positional or pleuritic component. Has had cough but no sputum.  In ER tachycardic. Review of his ECG;s shows this is chronic.  History of palpitations and PVCls  Cath by Dr Doreatha Lew over 10 years ago with no CAD    In the ED, vitals signs showed T 97.7, 137/82, HR 100, RR 16, Pulse ox 95% on 3L. Creatinine elevated at 2.6 (baseline ~2.1), troponin elevated at 0.41. CXR showed chronic ILD, superimposed bibasilar infection or pneumonia, trace left pleural effusion. EKG revealed RBBB (old), bifascicular block, new TWI in lead aVF and ST depression in lateral leads. Patient was given ASA and started on Heparin gtt.   Past Medical History  Diagnosis Date  . Diabetes mellitus   . High blood pressure   . High cholesterol   . CHF (congestive heart failure) ([place])   . Emphysema   . Joint pain   . AAA (abdominal aortic aneurysm) ([place])   . COPD (chronic obstructive pulmonary disease) ([place])   . Chronic kidney disease   . Irregular heart beat   . PVC (premature ventricular contraction)   . Shingles   . Chronic renal disease, stage III   . Sleep apnea     cpap  . Irregular heart beat   . CAD (coronary artery disease)   .  Chronic diastolic heart failure ([place]) 07/24/2011  . Atrial fibrillation [facility])      Past Surgical History  Procedure Laterality Date  . Penile prosthesis implant  09-2005  . Tonsillectomy    . Urethrotomy     No Known Allergies  I have reviewed the patient's current medications   . heparin 1,000 Units/hr (05/14/16 1502)    Prior to Admission medications   Medication Sig Start Date End Date Taking? Authorizing Provider  albuterol (PROVENTIL HFA;VENTOLIN HFA) 108 (90 Base) MCG/ACT inhaler Inhale 2 puffs into the lungs every 6 (six) hours as needed for wheezing or shortness of breath. 01/09/16  Yes Collene Gobble, MD  AMLODIPINE BESYLATE PO Take 1 tablet by mouth daily.    Yes Historical Provider, MD  Ascorbic Acid (VITAMIN C) 1000 MG tablet Take 1,000 mg by mouth daily.   Yes Historical Provider, MD  aspirin EC 81 MG tablet Take 81 mg by mouth at bedtime.    Yes Historical Provider, MD  carvedilol (COREG) 3.125 MG tablet Take 3.125 mg by mouth 2 (two) times daily with a meal.   Yes Historical Provider, MD  cephALEXin (KEFLEX) 500 MG capsule Take 500 mg by mouth daily.  03/06/16  Yes Historical Provider, MD  Chromium Picolinate 800 MCG TABS Take 2 tablets  by mouth daily.     Yes Historical Provider, MD  Coenzyme Q10 (CO Q-10) 100 MG CAPS Take 100 mg by mouth at bedtime.    Yes Historical Provider, MD  furosemide (LASIX) 20 MG tablet Take 20 mg by mouth 2 (two) times daily.   Yes Historical Provider, MD  glipiZIDE (GLUCOTROL XL) 10 MG 24 hr tablet Take 10 mg by mouth at bedtime.   Yes Historical Provider, MD  glipiZIDE (GLUCOTROL XL) 2.5 MG 24 hr tablet Take 2.5 mg by mouth daily.   Yes Historical Provider, MD  HYDROcodone-acetaminophen (NORCO/VICODIN) 5-325 MG per tablet 1 tablet as needed. 10/26/13  Yes Historical Provider, MD  metFORMIN (GLUCOPHAGE) 1000 MG tablet Take 1,000 mg by mouth 2 (two) times daily with a meal.   Yes Historical Provider, MD  predniSONE (DELTASONE) 10 MG tablet 4 tabs  for 2 days, then 3 tabs for 2 days, 2 tabs for 2 days, then 1 tab for 2 days, then stop 05/09/16  Yes Tammy S Parrett, NP  simvastatin (ZOCOR) 20 MG tablet Take 20 mg by mouth at bedtime.  10/21/12  Yes Historical Provider, MD  Tamsulosin HCl (FLOMAX) 0.4 MG CAPS Take 0.4 mg by mouth at bedtime.  10/21/12  Yes Historical Provider, MD  zinc gluconate 50 MG tablet Take 50 mg by mouth at bedtime.    Yes Historical Provider, MD  BAYER CONTOUR TEST test strip  11/07/12   Historical Provider, MD     Social History   Social History  . Marital Status: Married    Spouse Name: N/A  . Number of Children: 2  . Years of Education: N/A   Occupational History  . mental health    Social History Main Topics  . Smoking status: Former Smoker -- 3.00 packs/day for 30 years    Types: Cigarettes    Quit date: 12/31/1985  . Smokeless tobacco: Former Systems developer    Types: Chew     Comment: "little chewing tobacco"  . Alcohol Use: No     Comment: quit in 1985  . Drug Use: No  . Sexual Activity: Not on file   Other Topics Concern  . Not on file   Social History Narrative    Family Status  Relation Status Death Age  . Father Deceased 52  . Mother Deceased 56  . Sister Alive   . Brother Deceased   . Daughter Alive    Family History  Problem Relation Age of Onset  . Other Father     AAA  . Heart disease Father     After age 43,   AAA  . Hypertension Father   . Hyperlipidemia Father   . Heart attack Father   . Diabetes Mother     amputation  . Heart disease Mother   . Hypertension Mother   . Hyperlipidemia Mother   . Deep vein thrombosis Mother   . Heart attack Mother   . Diabetes Sister   . Heart disease Sister     Heart Disease before age 85  . Hypertension Sister   . Hyperlipidemia Sister   . Diabetes Brother   . Heart disease Brother   . Hypertension Brother   . Hyperlipidemia Brother   . Cancer Daughter      ROS:  General: Denies fever, chills, fatigue, change in appetite and  diaphoresis.  Respiratory: Denies SOB, cough, DOE, chest tightness, and wheezing.   Cardiovascular: Denies chest pain and palpitations.  Gastrointestinal: Denies nausea, vomiting, abdominal pain,  diarrhea, constipation, blood in stool and abdominal distention.  Genitourinary: Denies dysuria, urgency, frequency, hematuria, suprapubic pain and flank pain. Endocrine: Denies hot or cold intolerance, polyuria, and polydipsia. Musculoskeletal: Denies myalgias, back pain, joint swelling, arthralgias and gait problem.  Skin: Denies pallor, rash and wounds.  Neurological: Denies dizziness, headaches, weakness, lightheadedness, numbness,seizures, and syncope, Psychiatric/Behavioral: Denies mood changes, confusion, nervousness, sleep disturbance and agitation.  Physical Exam: Affect appropriate Chronically ill white male  HEENT: normal Neck supple with no adenopathy JVP normal no bruits no thyromegaly Lungs poor BS expitory wheezing  wheezing and good diaphragmatic motion Heart:  S1/S2 no murmur, no rub, gallop or click PMI normal Abdomen: benighn, BS positve, no tenderness, no AAA palpable  no bruit.  No HSM or HJR Distal pulses intact with no bruits No edema Neuro non-focal Skin warm and dry No muscular weakness   Labs:   Lab Results  Component Value Date   WBC 10.3 05/14/2016   HGB 11.7* 05/14/2016   HCT 35.1* 05/14/2016   MCV 97.8 05/14/2016   PLT 268 05/14/2016      Recent Labs Lab 05/14/16 1251  NA 138  K 4.8  CL 106  CO2 18*  BUN 48*  CREATININE 2.60*  CALCIUM 9.2  GLUCOSE 250*    Recent Labs  05/14/16 1301  TROPIPOC 0.41*   No results found for: BNP Lab Results  Component Value Date   CHOL  10/07/2009    100        ATP III CLASSIFICATION:  <200     mg/dL   Desirable  200-239  mg/dL   Borderline High  >=240    mg/dL   High          HDL 35* 10/07/2009   LDLCALC  10/07/2009    42        Total Cholesterol/HDL:CHD Risk Coronary Heart Disease Risk  Table                     Men   Women  1/2 Average Risk   3.4   3.3  Average Risk       5.0   4.4  2 X Average Risk   9.6   7.1  3 X Average Risk  23.4   11.0        Use the calculated Patient Ratio above and the CHD Risk Table to determine the patient's CHD Risk.        ATP III CLASSIFICATION (LDL):  <100     mg/dL   Optimal  100-129  mg/dL   Near or Above                    Optimal  130-159  mg/dL   Borderline  160-189  mg/dL   High  >190     mg/dL   Very High   TRIG 114 10/07/2009   Lab Results  Component Value Date   DDIMER * 10/06/2009    2.91        AT THE INHOUSE ESTABLISHED CUTOFF VALUE OF 0.48 ug/mL FEU, THIS ASSAY HAS BEEN DOCUMENTED IN THE LITERATURE TO HAVE A SENSITIVITY AND NEGATIVE PREDICTIVE VALUE OF AT LEAST 98 TO 99%.  THE TEST RESULT SHOULD BE CORRELATED WITH AN ASSESSMENT OF THE CLINICAL PROBABILITY OF DVT / VTE.   No results found for: LIPASE, AMYLASE  FERRITIN  Date/Time Value Ref Range Status  09/13/2015 04:02 PM 58 22 - 316 ng/ml Final   TIBC  Date/Time Value Ref Range Status  09/13/2015 04:02 PM 236 202 - 409 ug/dL Final   IRON  Date/Time Value Ref Range Status  09/13/2015 04:02 PM 48 42 - 163 ug/dL Final   RETIC %  Date/Time Value Ref Range Status  03/12/2016 03:14 PM 2.43* 0.80 - 1.80 % Final    Echo:   ECG:   Radiology:  Dg Chest 2 View  05/14/2016  CLINICAL DATA:  Shortness of breath, cough/ congestion, substernal chest pain x3 weeks EXAM: CHEST  2 VIEW COMPARISON:  05/09/2016 FINDINGS: Chronic interstitial markings with subpleural reticulation/fibrosis, particularly in the right mid lung and left lower lobe. Appearance is similar when compared to 2015, although superimposed bibasilar infection/pneumonia is not entirely excluded. Trace left pleural effusion. No pneumothorax. The heart is normal in size. Degenerative changes of the visualized thoracolumbar spine. IMPRESSION: Chronic interstitial lung disease. Superimposed mild  bibasilar infection/pneumonia is possible. Trace left pleural effusion. Electronically Signed   By: Julian Hy M.D.   On: 05/14/2016 10:27    ASSESSMENT AND PLAN:    Active Problems:   NSTEMI (non-ST elevated myocardial infarction) [facility])  Mr. Dorner is an 82 yo male with PMHx of AAA, chronic HFpEF, COPD, T2DM, HTN, MGUS, and OSA who presents to the ED with complaint of chest pain and found to have elevated troponin and ST depressions in lateral leads.   NSTEMI:  Atypical pain last 3 weeks with elevated troponin No documented CAD with no CAD cath 10 years ago and normal myovue 2015.  ECG with chronic tachycardia and RBBB ? Limb lead reversal and not posterior fasicular block as QRS is negative in AVL.  Start heparin , low dose beta blocker monitor for wheezing and low dose nitrates. Have called echo lab to do in ER assess RWMAls  Would need more elevated troponin ECG changes Or RWMA to recommend cath given high risk for worsening renal function   Chronic HFpEF: Echo in 2010 showed EF of 47-82%, grade 2 diastolic dysfunction, and no RWMA. BNP elevated on admission at 1454. CXR with trace left pleural effusion. Patient is on Coreg 3.125 mg BID, amlodipine, lasix 20 mg BID, at home. Hold diuretic in anticipation of possible need for cath.   Acute on Chronic CKD IV: Creatinine 2.6 on admission, baseline appears to be 2.1.  Hydrate  Pulm:  Seems as though he was at beach and has had pulmonary exacerbation for the last 3 weeks. Would have pulmonary see in house ? Steroids continue antibiotics and follow CXR   Discussed with patient and wife hesitancy to cath given atypical pain, severe lung disease and renal failure in setting of MGUS  Jenkins [place]

## 2016-05-14 NOTE — ED Notes (Signed)
Pt states for the last 2 weeks he has been having a centralized chest pressure and increase in sob. Pt was diagnosed with left sided pneumonia and currently taking antibiotics. Pt was seen today at [facility] and sent here for a EKG change and chest pain.

## 2016-05-14 NOTE — ED Notes (Signed)
PT HAD XRAY THIS AM AT [facility].

## 2016-05-15 ENCOUNTER — Encounter (HOSPITAL_COMMUNITY): Payer: Self-pay | Admitting: Interventional Cardiology

## 2016-05-15 ENCOUNTER — Inpatient Hospital Stay (HOSPITAL_COMMUNITY): Payer: Medicare Other

## 2016-05-15 ENCOUNTER — Encounter (HOSPITAL_COMMUNITY)
Admission: EM | Disposition: A | Discharge status: Home Health | Payer: Self-pay | Source: Home / Self Care | Attending: Internal Medicine

## 2016-05-15 DIAGNOSIS — I2510 Atherosclerotic heart disease of native coronary artery without angina pectoris: Secondary | ICD-10-CM

## 2016-05-15 DIAGNOSIS — N189 Chronic kidney disease, unspecified: Secondary | ICD-10-CM

## 2016-05-15 DIAGNOSIS — R0789 Other chest pain: Secondary | ICD-10-CM

## 2016-05-15 DIAGNOSIS — I5021 Acute systolic (congestive) heart failure: Secondary | ICD-10-CM

## 2016-05-15 DIAGNOSIS — I509 Heart failure, unspecified: Secondary | ICD-10-CM

## 2016-05-15 DIAGNOSIS — I25110 Atherosclerotic heart disease of native coronary artery with unstable angina pectoris: Secondary | ICD-10-CM

## 2016-05-15 HISTORY — PX: CARDIAC CATHETERIZATION: SHX172

## 2016-05-15 LAB — COMPREHENSIVE METABOLIC PANEL
ALBUMIN: 2.8 g/dL — AB (ref 3.5–5.0)
ALT: 13 U/L — ABNORMAL LOW (ref 17–63)
ANION GAP: 12 (ref 5–15)
AST: 32 U/L (ref 15–41)
Alkaline Phosphatase: 56 U/L (ref 38–126)
BILIRUBIN TOTAL: 0.9 mg/dL (ref 0.3–1.2)
BUN: 45 mg/dL — ABNORMAL HIGH (ref 6–20)
CO2: 20 mmol/L — AB (ref 22–32)
Calcium: 8.6 mg/dL — ABNORMAL LOW (ref 8.9–10.3)
Chloride: 109 mmol/L (ref 101–111)
Creatinine, Ser: 2.35 mg/dL — ABNORMAL HIGH (ref 0.61–1.24)
GFR calc non Af Amer: 24 mL/min — ABNORMAL LOW (ref >60)
GFR, EST AFRICAN AMERICAN: 28 mL/min — AB (ref >60)
GLUCOSE: 129 mg/dL — AB (ref 65–99)
POTASSIUM: 4.5 mmol/L (ref 3.5–5.1)
SODIUM: 141 mmol/L (ref 135–145)
TOTAL PROTEIN: 6 g/dL — AB (ref 6.5–8.1)

## 2016-05-15 LAB — POCT I-STAT 3, VENOUS BLOOD GAS (G3P V)
ACID-BASE DEFICIT: 6 mmol/L — AB (ref 0.0–2.0)
Bicarbonate: 18.2 mEq/L — ABNORMAL LOW (ref 20.0–24.0)
O2 Saturation: 49 %
TCO2: 19 mmol/L (ref 0–100)
pCO2, Ven: 30.3 mmHg — ABNORMAL LOW (ref 45.0–50.0)
pH, Ven: 7.386 — ABNORMAL HIGH (ref 7.250–7.300)
pO2, Ven: 26 mmHg — ABNORMAL LOW (ref 31.0–45.0)

## 2016-05-15 LAB — CBC
HCT: 34 % — ABNORMAL LOW (ref 39.0–52.0)
Hemoglobin: 10.9 g/dL — ABNORMAL LOW (ref 13.0–17.0)
MCH: 31.9 pg (ref 26.0–34.0)
MCHC: 32.1 g/dL (ref 30.0–36.0)
MCV: 99.4 fL (ref 78.0–100.0)
Platelets: 251 10*3/uL (ref 150–400)
RBC: 3.42 MIL/uL — ABNORMAL LOW (ref 4.22–5.81)
RDW: 13.9 % (ref 11.5–15.5)
WBC: 10.5 10*3/uL (ref 4.0–10.5)

## 2016-05-15 LAB — URINALYSIS, ROUTINE W REFLEX MICROSCOPIC
BILIRUBIN URINE: NEGATIVE
GLUCOSE, UA: 250 mg/dL — AB
Ketones, ur: NEGATIVE mg/dL
Leukocytes, UA: NEGATIVE
NITRITE: NEGATIVE
PH: 6.5 (ref 5.0–8.0)
Protein, ur: 100 mg/dL — AB
SPECIFIC GRAVITY, URINE: 1.016 (ref 1.005–1.030)

## 2016-05-15 LAB — HEMOGLOBIN A1C
Hgb A1c MFr Bld: 7.5 % — ABNORMAL HIGH (ref 4.8–5.6)
MEAN PLASMA GLUCOSE: 169 mg/dL

## 2016-05-15 LAB — HIV ANTIBODY (ROUTINE TESTING W REFLEX): HIV SCREEN 4TH GENERATION: NONREACTIVE

## 2016-05-15 LAB — GLUCOSE, CAPILLARY
GLUCOSE-CAPILLARY: 222 mg/dL — AB (ref 65–99)
Glucose-Capillary: 163 mg/dL — ABNORMAL HIGH (ref 65–99)
Glucose-Capillary: 190 mg/dL — ABNORMAL HIGH (ref 65–99)

## 2016-05-15 LAB — POCT I-STAT 3, ART BLOOD GAS (G3+)
ACID-BASE DEFICIT: 7 mmol/L — AB (ref 0.0–2.0)
Bicarbonate: 17 mEq/L — ABNORMAL LOW (ref 20.0–24.0)
O2 Saturation: 86 %
PH ART: 7.399 (ref 7.350–7.450)
TCO2: 18 mmol/L (ref 0–100)
pCO2 arterial: 27.5 mmHg — ABNORMAL LOW (ref 35.0–45.0)
pO2, Arterial: 50 mmHg — ABNORMAL LOW (ref 80.0–100.0)

## 2016-05-15 LAB — URINE MICROSCOPIC-ADD ON

## 2016-05-15 LAB — STREP PNEUMONIAE URINARY ANTIGEN: STREP PNEUMO URINARY ANTIGEN: NEGATIVE

## 2016-05-15 SURGERY — RIGHT/LEFT HEART CATH AND CORONARY ANGIOGRAPHY

## 2016-05-15 MED ORDER — CLOPIDOGREL BISULFATE 75 MG PO TABS
75.0000 mg | ORAL_TABLET | Freq: Every day | ORAL | Status: DC
  Administered 2016-05-16: 75 mg via ORAL
  Filled 2016-05-15: qty 1

## 2016-05-15 MED ORDER — ASPIRIN 81 MG PO CHEW: 81.0000 mg | CHEWABLE_TABLET | Freq: Every day | ORAL | Status: DC

## 2016-05-15 MED ORDER — IOPAMIDOL (ISOVUE-370) INJECTION 76%
INTRAVENOUS | Status: AC
  Filled 2016-05-15: qty 100

## 2016-05-15 MED ORDER — HEPARIN (PORCINE) IN NACL 2-0.9 UNIT/ML-% IJ SOLN
INTRAMUSCULAR | Status: AC
  Filled 2016-05-15: qty 1500

## 2016-05-15 MED ORDER — OXYCODONE-ACETAMINOPHEN 5-325 MG PO TABS
1.0000 | ORAL_TABLET | ORAL | Status: DC | PRN
  Administered 2016-05-17: 1 via ORAL
  Filled 2016-05-15: qty 2

## 2016-05-15 MED ORDER — HEPARIN SODIUM (PORCINE) 1000 UNIT/ML IJ SOLN
INTRAMUSCULAR | Status: AC
  Filled 2016-05-15: qty 1

## 2016-05-15 MED ORDER — LIDOCAINE HCL (PF) 1 % IJ SOLN
INTRAMUSCULAR | Status: AC
  Filled 2016-05-15: qty 30

## 2016-05-15 MED ORDER — HEPARIN (PORCINE) IN NACL 2-0.9 UNIT/ML-% IJ SOLN
INTRAMUSCULAR | Status: DC | PRN
  Administered 2016-05-15: 1500 mL

## 2016-05-15 MED ORDER — HEPARIN (PORCINE) IN NACL 100-0.45 UNIT/ML-% IJ SOLN
1300.0000 [IU]/h | INTRAMUSCULAR | Status: DC
  Administered 2016-05-15: 1000 [IU]/h via INTRAVENOUS
  Administered 2016-05-16 – 2016-05-17 (×2): 1300 [IU]/h via INTRAVENOUS
  Filled 2016-05-15 (×2): qty 250

## 2016-05-15 MED ORDER — HEPARIN SODIUM (PORCINE) 1000 UNIT/ML IJ SOLN
INTRAMUSCULAR | Status: DC | PRN
  Administered 2016-05-15: 4000 [IU] via INTRAVENOUS

## 2016-05-15 MED ORDER — VERAPAMIL HCL 2.5 MG/ML IV SOLN
INTRAVENOUS | Status: AC
  Filled 2016-05-15: qty 2

## 2016-05-15 MED ORDER — ONDANSETRON HCL 4 MG/2ML IJ SOLN: 4.0000 mg | Freq: Four times a day (QID) | INTRAMUSCULAR | Status: DC | PRN

## 2016-05-15 MED ORDER — VERAPAMIL HCL 2.5 MG/ML IV SOLN
INTRAVENOUS | Status: DC | PRN
  Administered 2016-05-15: 10 mL via INTRA_ARTERIAL

## 2016-05-15 MED ORDER — LIDOCAINE HCL (PF) 1 % IJ SOLN
INTRAMUSCULAR | Status: DC | PRN
  Administered 2016-05-15: 2 mL via INTRADERMAL

## 2016-05-15 MED ORDER — SODIUM CHLORIDE 0.9 % WEIGHT BASED INFUSION
1.0000 mL/kg/h | INTRAVENOUS | Status: DC
  Administered 2016-05-15 (×2): 1 mL/kg/h via INTRAVENOUS

## 2016-05-15 MED ORDER — GUAIFENESIN ER 600 MG PO TB12
600.0000 mg | ORAL_TABLET | Freq: Two times a day (BID) | ORAL | Status: DC
  Administered 2016-05-15 – 2016-05-22 (×14): 600 mg via ORAL
  Filled 2016-05-15 (×14): qty 1

## 2016-05-15 MED ORDER — SODIUM CHLORIDE 0.9% FLUSH
3.0000 mL | Freq: Two times a day (BID) | INTRAVENOUS | Status: DC
  Administered 2016-05-16: 3 mL via INTRAVENOUS

## 2016-05-15 MED ORDER — IOPAMIDOL (ISOVUE-370) INJECTION 76%
INTRAVENOUS | Status: DC | PRN
  Administered 2016-05-15: 50 mL via INTRA_ARTERIAL

## 2016-05-15 MED ORDER — SODIUM CHLORIDE 0.9% FLUSH: 3.0000 mL | INTRAVENOUS | Status: DC | PRN

## 2016-05-15 MED ORDER — SODIUM CHLORIDE 0.9 % IV SOLN: 250.0000 mL | INTRAVENOUS | Status: DC | PRN

## 2016-05-15 MED ORDER — ACETAMINOPHEN 325 MG PO TABS: 650.0000 mg | ORAL_TABLET | ORAL | Status: DC | PRN

## 2016-05-15 SURGICAL SUPPLY — 14 items
CATH BALLN WEDGE 5F 110CM (CATHETERS) ×1 IMPLANT
CATH INFINITI 5 FR JL3.5 (CATHETERS) ×1 IMPLANT
CATH INFINITI JR4 5F (CATHETERS) ×1 IMPLANT
CATH LAUNCHER 5F EBU3.5 (CATHETERS) ×1 IMPLANT
DEVICE RAD COMP TR BAND LRG (VASCULAR PRODUCTS) ×1 IMPLANT
GLIDESHEATH SLEND A-KIT 6F 22G (SHEATH) ×1 IMPLANT
KIT HEART LEFT (KITS) ×2 IMPLANT
PACK CARDIAC CATHETERIZATION (CUSTOM PROCEDURE TRAY) ×2 IMPLANT
SHEATH FAST CATH BRACH 5F 5CM (SHEATH) ×1 IMPLANT
TRANSDUCER W/STOPCOCK (MISCELLANEOUS) ×3 IMPLANT
TUBING CIL FLEX 10 FLL-RA (TUBING) ×2 IMPLANT
WIRE EMERALD 3MM-J .025X260CM (WIRE) ×1 IMPLANT
WIRE HI TORQ VERSACORE-J 145CM (WIRE) ×1 IMPLANT
WIRE SAFE-T 1.5MM-J .035X260CM (WIRE) ×1 IMPLANT

## 2016-05-15 NOTE — Progress Notes (Signed)
PT Cancellation Note  Patient Details Name: Alan Ford MRN: [id number] DOB: October 24, 1930   Cancelled Treatment:    Reason Eval/Treat Not Completed: Medical issues which prohibited therapy (Troponin trending up).  Will continue to follow acutely.  Collie Siad PT, DPT  Pager: [phone] Phone: [phone] 05/15/2016, 8:54 AM

## 2016-05-15 NOTE — Progress Notes (Addendum)
Nutrition Brief Note  RD consulted to assess nutrition status needs, per COPD GOLD protocol.   Wt Readings from Last 15 Encounters:  05/15/16 177 lb 7.5 oz (80.5 kg)  05/14/16 184 lb (83.462 kg)  05/09/16 186 lb (84.369 kg)  03/12/16 185 lb 4.8 oz (84.052 kg)  01/09/16 191 lb (86.637 kg)  12/12/15 192 lb 8 oz (87.317 kg)  12/06/15 190 lb 6.4 oz (86.365 kg)  09/13/15 190 lb 1.6 oz (86.229 kg)  06/14/15 198 lb (89.812 kg)  05/10/15 192 lb (87.091 kg)  01/17/15 188 lb (85.276 kg)  11/17/14 203 lb 9.6 oz (92.352 kg)  09/14/14 195 lb (88.451 kg)  06/14/14 196 lb (88.905 kg)  04/13/14 197 lb (89.359 kg)   Alan Ford is a 82 y.o. male with medical history significant of diabetes, HTN, HLD, CHF, emphysema/COPD, C KD, A. fib, OSA on CPAP, diastolic CHF, presenting with 3 history of chest pain.  Pt admitted with NSTEMI and COPD exacerbation. He is NPO for cardiac cath later today.   Spoke with pt at bedside. He reports good appetite currently and PTA. He consumed 100% of dinner last night. He is ager to eat after his procedure.   Pt reports UBW is around 180# and reports weight loss is due to diuretics ("I take Lasix at home, but they gave me even more here").   Nutrition-Focused physical exam completed. Findings are no fat depletion, mild muscle depletion, and no edema. Muscle depletion noted in lower extremities only. 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   Pt reports elevated blood sugars recently due to initiation of steroids (home CBGS ranged from 100-135 prior to steroid initiation, over 200 since steroids initiated). He reveals his last Hgb A1c was 6.7. Noted latest Hgb A1c from 05/14/16 was 7.5.   Body mass index is 25.46 kg/(m^2). Patient meets criteria for overweight based on current BMI.   Current diet order is NPO (previously on Heart Healthy diet),  patient is consuming approximately 100% of meals at this time. Labs and medications reviewed.   No nutrition interventions warranted at this time. If nutrition issues arise, please consult RD.   Sallyanne Birkhead A. Jimmye Norman, RD, LDN, CDE Pager: [phone] After hours Pager: [phone]

## 2016-05-15 NOTE — Progress Notes (Signed)
[name], male   DOB: [birth year], 82 y.o.   MRN: [id number]    Subjective:  Mild dyspnea  Objective:  Filed Vitals:   05/15/16 0300 05/15/16 0400 05/15/16 0500 05/15/16 0600  BP: 138/96 115/73 139/80 116/82  Pulse: 87 83 87 45  Temp:      TempSrc:      Resp: '19 16 14 23  '$ Height:      Weight:   80.5 kg (177 lb 7.5 oz)   SpO2: 93% 93% 90% 92%    Intake/Output from previous day:  Intake/Output Summary (Last 24 hours) at 05/15/16 0756 Last data filed at 05/15/16 0600  Gross per 24 hour  Intake 1561.34 ml  Output   1280 ml  Net 281.34 ml    Physical Exam: Affect appropriate Chronically ill white male  HEENT: normal Neck supple with no adenopathy JVP normal no bruits no thyromegaly Lungs decreased BS  no wheezing and good diaphragmatic motion Heart:  S1/S2 no murmur, no rub, gallop or click PMI normal Abdomen: benighn, BS positve, no tenderness, no AAA no bruit.  No HSM or HJR Distal pulses intact with no bruits No edema Neuro non-focal Skin warm and dry No muscular weakness   Lab Results: Basic Metabolic Panel:  Recent Labs  05/14/16 1251 05/15/16 0513  NA 138 141  K 4.8 4.5  CL 106 109  CO2 18* 20*  GLUCOSE 250* 129*  BUN 48* 45*  CREATININE 2.60* 2.35*  CALCIUM 9.2 8.6*   Liver Function Tests:  Recent Labs  05/15/16 0513  AST 32  ALT 13*  ALKPHOS 56  BILITOT 0.9  PROT 6.0*  ALBUMIN 2.8*   CBC:  Recent Labs  05/14/16 1012 05/14/16 1251 05/15/16 0513  WBC 9.2 10.3 10.5  NEUTROABS 7.4  --   --   HGB 11.6* 11.7* 10.9*  HCT 34.3* 35.1* 34.0*  MCV 96.9 97.8 99.4  PLT 349.0 268 251   Cardiac Enzymes:  Recent Labs  05/14/16 1917 05/14/16 2042  TROPONINI 0.33* 0.37*   Hemoglobin A1C:  Recent Labs  05/14/16 1916  HGBA1C 7.5*    Imaging: Dg Chest 2 View  05/14/2016  CLINICAL DATA:  Shortness of breath, cough/ congestion, substernal chest pain x3 weeks EXAM: CHEST  2 VIEW COMPARISON:  05/09/2016 FINDINGS:  Chronic interstitial markings with subpleural reticulation/fibrosis, particularly in the right mid lung and left lower lobe. Appearance is similar when compared to 2015, although superimposed bibasilar infection/pneumonia is not entirely excluded. Trace left pleural effusion. No pneumothorax. The heart is normal in size. Degenerative changes of the visualized thoracolumbar spine. IMPRESSION: Chronic interstitial lung disease. Superimposed mild bibasilar infection/pneumonia is possible. Trace left pleural effusion. Electronically Signed   By: Julian Hy M.D.   On: 05/14/2016 10:27    Cardiac Studies:  ECG:  NSR RBBB lateral T wave changes    Telemetry:  NSR 05/15/2016   Echo: EF 30-35% reviewed   Medications:   . amLODipine  2.5 mg Oral Daily  . amoxicillin-clavulanate  1 tablet Oral Q12H  . aspirin  81 mg Oral Pre-Cath  . aspirin EC  81 mg Oral QHS  . carvedilol  3.125 mg Oral BID WC  . insulin aspart  0-5 Units Subcutaneous QHS  . insulin aspart  0-9 Units Subcutaneous TID WC  . isosorbide dinitrate  10 mg Oral BID  . simvastatin  20 mg Oral QHS  . sodium chloride flush  3 mL Intravenous Q12H  . sodium chloride flush  3 mL Intravenous Q12H  . tamsulosin  0.4 mg Oral QHS     . sodium chloride    . sodium chloride 100 mL/hr at 05/15/16 0417  . sodium chloride     Followed by  . sodium chloride    . heparin 1,000 Units/hr (05/15/16 0420)    Assessment/Plan:  CHF:  New onset elevated BNP EF 30-35% by echo. Diuretics held for cath. Will likely need lasix post cath Continue Nitrates and beta blocker  CRF:  Would have Dr Clover Mealy and renal see post cath.  Hydrated and HCO3 given Cr improved 2.6->2.3 likely related to MGUS  Chest Pain: with elevated troponin and ECG changes. Long discussion with patient last night about need for cath despite CRF He understands risk of worsening renal function. Discussed with Dr Tamala Julian who will do right and left cath today with minimal dye And  no LV gram  Pulmonary:  Severe COPD previous heavy smoker.  3L [place] CPAP at home continue antibiotics f/u CXR 48 hrs   [name] 05/15/2016, 7:56 AM

## 2016-05-15 NOTE — Progress Notes (Signed)
OT Cancellation Note  Patient Details Name: SAEL FURCHES MRN: [id number] DOB: 1930/10/25   Cancelled Treatment:    Reason Eval/Treat Not Completed: Patient not medically ready - Pt with elevated Troponins which are trending upwardly.  Will reattempt and see as medically appropriate.  Darlina Rumpf [place], OTR/L [phone]  05/15/2016, 9:50 AM

## 2016-05-15 NOTE — Progress Notes (Addendum)
ANTICOAGULATION CONSULT NOTE - Initial Consult  Pharmacy Consult for heparin Indication: ACS/STEMI  No Known Allergies  Patient Measurements: Height: '5\' 10"'$  (177.8 cm) Weight: 177 lb 7.5 oz (80.5 kg) IBW/kg (Calculated) : 73 Heparin Dosing Weight: 80 kg  Vital Signs: Temp: 98.3 F (36.8 C) (05/16 0840) Temp Source: Oral (05/16 0840) BP: 125/79 mmHg (05/16 1343) Pulse Rate: 0 (05/16 1353)  Labs:  Recent Labs  05/14/16 1012 05/14/16 1251 05/14/16 1917 05/14/16 2042 05/15/16 0513  HGB 11.6* 11.7*  --   --  10.9*  HCT 34.3* 35.1*  --   --  34.0*  PLT 349.0 268  --   --  251  LABPROT  --  14.3  --   --   --   INR  --  1.10  --   --   --   CREATININE 2.32* 2.60*  --   --  2.35*  TROPONINI  --   --  0.33* 0.37*  --     Estimated Creatinine Clearance: 24.2 mL/min (by C-G formula based on Cr of 2.35).   Medical History: Past Medical History  Diagnosis Date  . Diabetes mellitus   . High blood pressure   . High cholesterol   . CHF (congestive heart failure) ([place])   . Emphysema   . Joint pain   . AAA (abdominal aortic aneurysm) ([place])   . COPD (chronic obstructive pulmonary disease) ([place])   . Chronic kidney disease   . Irregular heart beat   . PVC (premature ventricular contraction)   . Shingles   . Chronic renal disease, stage III   . Sleep apnea     cpap  . Irregular heart beat   . CAD (coronary artery disease)   . Chronic diastolic heart failure ([place]) 07/24/2011  . Atrial fibrillation [facility])    Assessment:  82 y/o M s/p cath to start heparin for ACS/STEMI 8 hrs post sheath removal. CBC stable.  Goal of Therapy:  HL 0.3 - 0.7 Monitor platelets by anticoagulation protocol: yes   Plan:  Heparin gtt 1000 units/hr to start 8 hrs post sheath removal 8 hr HL Daily HL, CBC Monitor for S&S of bleed  Angela Burke, PharmD Pharmacy Resident Pager: [phone] 05/15/2016,2:33 PM

## 2016-05-15 NOTE — Research (Signed)
LEADERS FREE II Informed Consent   Subject Name: Alan Ford  Subject met inclusion and exclusion criteria.  The informed consent form, study requirements and expectations were reviewed with the subject and questions and concerns were addressed prior to the signing of the consent form.  The subject verbalized understanding of the trail requirements.  The subject agreed to participate in the LEADERS FREE II trial and signed the informed consent.  The informed consent was obtained prior to performance of any protocol-specific procedures for the subject.  A copy of the signed informed consent was given to the subject and a copy was placed in the subject's medical record. Patient will meet Inclusion if Bio[employer] Stent implanted.  Hedrick,[name] 05/15/2016, 11:15

## 2016-05-15 NOTE — Consult Note (Signed)
[address]       [place] [address]             [phone]          CARDIOTHORACIC SURGERY CONSULTATION REPORT  PCP is Mathews Argyle, MD Referring Provider is Belva Crome, MD Primary Cardiologist is Candee Furbish, MD Primary Pulmonologist is [name] , MD Primary Nephrologist is Corliss Parish, MD  Reason for consultation:  Severe 3-vessel CAD with severe LV systolic dysfunction and congestive heart failure s/p acute non-STEMI  HPI:  Patient is an 82 year old male with history of congestive heart failure, hypertension, type 2 diabetes mellitus with complications, hypercholesterolemia, COPD on home oxygen therapy, chronic kidney disease, and remote history of alcohol abuse who has been referred for surgical consultation to discuss treatment options for management of severe multivessel coronary artery disease with ischemic cardiomyopathy, unstable angina and congestive heart failure.  Patient states that he underwent cardiac catheterization more than 20 years ago by Dr. Doreatha Lew.  Details are unclear but he states that he did not have a heart attack at that time and no long-term therapy was recommended.  Approximately 6 or 7 years ago the patient presented with symptoms of congestive heart failure.  He has been followed ever since by Dr. Marlou Porch.  Transthoracic echocardiogram performed in 2010 revealed normal left ventricular systolic function with ejection fraction estimated 55-60%.  Over the past several years patient has developed progressive symptoms of exertional shortness of breath.  He has been followed by Dr. Lamonte Sakai since 2014. Pulmonary function tests performed in June of 2014 revealed mild obstruction with FEV1 reported 2.6 L (92% predicted).  He was placed on bronchodilators and home oxygen therapy. The patient has also been diagnosed with obstructive sleep apnea. The patient has continued to experience progressive symptoms of exertional  shortness of breath.  He reportedly had a stress myoview exam in 2015. Results are not currently available but were reportedly felt to be low risk.  Approximately 4 weeks ago the patient began to experience episodes of substernal chest pain described as a pressure like pain associated with acute exacerbation of shortness of breath. Symptoms have been associated with activity but also have developed at rest and occasionally awaken the patient from his sleep at night.  The patient now gets short of breath with mild activity.  He denies any recent problems with PND, orthopnea, or worsened lower extremity edema. He has had some mild dizzy spells without syncope. Over the last 2 weeks he was seen on 2 separate occasions by [facility] and treated with steroids and antibiotics. Symptoms did not improve.  He presented to the emergency department 05/14/2016 where troponin and BNP levels were elevated.  The patient was afebrile and white blood count was normal. Chest x-ray revealed chronic interstitial lung disease with "possible" superimposed mild basilar infection or pneumonia. The patient was heparinized and admitted to the hospital.  A transthoracic echocardiogram was performed revealing severe left ventricular systolic dysfunction with ejection fraction estimated 30-35%. The patient underwent diagnostic cardiac catheterization by Dr. Tamala Julian and was found to have severe three-vessel coronary artery disease. Cardiothoracic surgical consultation was requested.  The patient is married and lives locally in [place]. 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  He reports a fairly sedentary lifestyle and states that he has been limited primarily because  of long-standing exertional shortness of breath. He has a remote history of heavy tobacco abuse and alcohol abuse, but he quit both more than  30 years ago.  Past Medical History  Diagnosis Date  . Diabetes mellitus   . High blood pressure   . High cholesterol   . CHF (congestive heart failure) ([place])   . Emphysema   . Joint pain   . AAA (abdominal aortic aneurysm) ([place])   . COPD (chronic obstructive pulmonary disease) ([place])   . Chronic kidney disease   . Irregular heart beat   . PVC (premature ventricular contraction)   . Shingles   . Chronic renal disease, stage III   . Sleep apnea     cpap  . Irregular heart beat   . CAD (coronary artery disease)   . Chronic diastolic heart failure ([place]) 07/24/2011  . Atrial fibrillation [facility])     Past Surgical History  Procedure Laterality Date  . Penile prosthesis implant  09-2005  . Tonsillectomy    . Urethrotomy    . Cardiac catheterization N/A 05/15/2016    Procedure: Right/Left Heart Cath and Coronary Angiography;  Surgeon: Belva Crome, MD;  Location: [facility];  Service: Cardiovascular;  Laterality: N/A;    Family History  Problem Relation Age of Onset  . Other Father     AAA  . Heart disease Father     After age 53,   AAA  . Hypertension Father   . Hyperlipidemia Father   . Heart attack Father   . Diabetes Mother     amputation  . Heart disease Mother   . Hypertension Mother   . Hyperlipidemia Mother   . Deep vein thrombosis Mother   . Heart attack Mother   . Diabetes Sister   . Heart disease Sister     Heart Disease before age 79  . Hypertension Sister   . Hyperlipidemia Sister   . Diabetes Brother   . Heart disease Brother   . Hypertension Brother   . Hyperlipidemia Brother   . Cancer Daughter     Social History   Social History  . Marital Status: Married    Spouse Name: N/A  . Number of Children: 2  . Years of Education: N/A   Occupational History  . mental health    Social History Main Topics  . Smoking status: Former Smoker -- 3.00 packs/day for 30 years    Types: Cigarettes    Quit date: 12/31/1985  . Smokeless tobacco:  Former Systems developer    Types: Chew     Comment: "little chewing tobacco"  . Alcohol Use: No     Comment: quit in 1985  . Drug Use: No  . Sexual Activity: Not on file   Other Topics Concern  . Not on file   Social History Narrative    Prior to Admission medications   Medication Sig Start Date End Date Taking? Authorizing Provider  albuterol (PROVENTIL HFA;VENTOLIN HFA) 108 (90 Base) MCG/ACT inhaler Inhale 2 puffs into the lungs every 6 (six) hours as needed for wheezing or shortness of breath. 01/09/16  Yes Collene Gobble, MD  AMLODIPINE BESYLATE PO Take 1 tablet by mouth daily.    Yes Historical Provider, MD  Ascorbic Acid (VITAMIN C) 1000 MG tablet Take 1,000 mg by mouth daily.   Yes Historical Provider, MD  aspirin EC 81 MG tablet Take 81 mg by mouth at bedtime.    Yes Historical Provider, MD  carvedilol (COREG)  3.125 MG tablet Take 3.125 mg by mouth 2 (two) times daily with a meal.   Yes Historical Provider, MD  cephALEXin (KEFLEX) 500 MG capsule Take 500 mg by mouth daily.  03/06/16  Yes Historical Provider, MD  Chromium Picolinate 800 MCG TABS Take 2 tablets by mouth daily.     Yes Historical Provider, MD  Coenzyme Q10 (CO Q-10) 100 MG CAPS Take 100 mg by mouth at bedtime.    Yes Historical Provider, MD  furosemide (LASIX) 20 MG tablet Take 20 mg by mouth 2 (two) times daily.   Yes Historical Provider, MD  glipiZIDE (GLUCOTROL XL) 10 MG 24 hr tablet Take 10 mg by mouth at bedtime.   Yes Historical Provider, MD  glipiZIDE (GLUCOTROL XL) 2.5 MG 24 hr tablet Take 2.5 mg by mouth daily.   Yes Historical Provider, MD  HYDROcodone-acetaminophen (NORCO/VICODIN) 5-325 MG per tablet 1 tablet as needed. 10/26/13  Yes Historical Provider, MD  metFORMIN (GLUCOPHAGE) 1000 MG tablet Take 1,000 mg by mouth 2 (two) times daily with a meal.   Yes Historical Provider, MD  predniSONE (DELTASONE) 10 MG tablet 4 tabs for 2 days, then 3 tabs for 2 days, 2 tabs for 2 days, then 1 tab for 2 days, then stop 05/09/16   Yes Tammy S Parrett, NP  simvastatin (ZOCOR) 20 MG tablet Take 20 mg by mouth at bedtime.  10/21/12  Yes Historical Provider, MD  Tamsulosin HCl (FLOMAX) 0.4 MG CAPS Take 0.4 mg by mouth at bedtime.  10/21/12  Yes Historical Provider, MD  zinc gluconate 50 MG tablet Take 50 mg by mouth at bedtime.    Yes Historical Provider, MD  BAYER CONTOUR TEST test strip  11/07/12   Historical Provider, MD    Current Facility-Administered Medications  Medication Dose Route Frequency Provider Last Rate Last Dose  . 0.9 %  sodium chloride infusion  250 mL Intravenous PRN Belva Crome, MD      . acetaminophen (TYLENOL) tablet 650 mg  650 mg Oral Q4H PRN Waldemar Dickens, MD   650 mg at 05/15/16 0948  . albuterol (PROVENTIL) (2.5 MG/3ML) 0.083% nebulizer solution 2.5 mg  2.5 mg Inhalation Q6H PRN Waldemar Dickens, MD      . amLODipine (NORVASC) tablet 2.5 mg  2.5 mg Oral Daily Waldemar Dickens, MD   2.5 mg at 05/15/16 0948  . amoxicillin-clavulanate (AUGMENTIN) 500-125 MG per tablet 500 mg  1 tablet Oral Q12H Waldemar Dickens, MD   500 mg at 05/15/16 1756  . aspirin EC tablet 81 mg  81 mg Oral QHS Waldemar Dickens, MD   81 mg at 05/14/16 2140  . carvedilol (COREG) tablet 3.125 mg  3.125 mg Oral BID WC Waldemar Dickens, MD   3.125 mg at 05/15/16 1651  . [START ON 05/16/2016] clopidogrel (PLAVIX) tablet 75 mg  75 mg Oral Q breakfast Belva Crome, MD      . heparin ADULT infusion 100 units/mL (25000 units/250 mL)  1,000 Units/hr Intravenous Continuous Myrene Galas, RPH      . HYDROcodone-acetaminophen (NORCO/VICODIN) 5-325 MG per tablet 1 tablet  1 tablet Oral Q4H PRN Waldemar Dickens, MD      . insulin aspart (novoLOG) injection 0-5 Units  0-5 Units Subcutaneous QHS Waldemar Dickens, MD   0 Units at 05/14/16 2151  . insulin aspart (novoLOG) injection 0-9 Units  0-9 Units Subcutaneous TID WC Waldemar Dickens, MD   3 Units at 05/15/16  1652  . isosorbide dinitrate (ISORDIL) tablet 10 mg  10 mg Oral BID Josue Hector, MD    10 mg at 05/15/16 0949  . morphine 2 MG/ML injection 2 mg  2 mg Intravenous Q2H PRN Waldemar Dickens, MD   2 mg at 05/15/16 0416  . ondansetron (ZOFRAN) injection 4 mg  4 mg Intravenous Q6H PRN Belva Crome, MD      . oxyCODONE-acetaminophen (PERCOCET/ROXICET) 5-325 MG per tablet 1-2 tablet  1-2 tablet Oral Q4H PRN Belva Crome, MD      . simvastatin (ZOCOR) tablet 20 mg  20 mg Oral QHS Waldemar Dickens, MD   20 mg at 05/14/16 2140  . sodium chloride flush (NS) 0.9 % injection 3 mL  3 mL Intravenous Q12H Waldemar Dickens, MD   3 mL at 05/14/16 2200  . sodium chloride flush (NS) 0.9 % injection 3 mL  3 mL Intravenous Q12H Belva Crome, MD   3 mL at 05/15/16 1648  . sodium chloride flush (NS) 0.9 % injection 3 mL  3 mL Intravenous PRN Belva Crome, MD      . tamsulosin [facility]) capsule 0.4 mg  0.4 mg Oral QHS Waldemar Dickens, MD   0.4 mg at 05/14/16 2140    No Known Allergies    Review of Systems:   General:  normal appetite, decreased energy, no weight gain, no weight loss, no fever  Cardiac:  + chest pain with exertion, + chest pain at rest, + SOB with exertion, + resting SOB, no PND, no orthopnea, no palpitations, no arrhythmia, no atrial fibrillation, + LE edema, + dizzy spells, no syncope  Respiratory:  + shortness of breath, + home oxygen, + productive cough, no dry cough, no bronchitis, + wheezing, no hemoptysis, no asthma, no pain with inspiration or cough, + sleep apnea, doesn't use his CPAP at night routinely  GI:   no difficulty swallowing, no reflux, no frequent heartburn, no hiatal hernia, no abdominal pain, no constipation, no diarrhea, no hematochezia, no hematemesis, no melena  GU:   no dysuria,  + frequency, + urinary tract infection, no hematuria, no enlarged prostate, no kidney stones, + chronic kidney disease  Vascular:  no pain suggestive of claudication, no pain in feet, no leg cramps, no varicose veins, no DVT, no non-healing foot ulcer  Neuro:   no stroke, no TIA's, no  seizures, no headaches, no temporary blindness one eye,  no slurred speech, no peripheral neuropathy, no chronic pain, no instability of gait, no memory/cognitive dysfunction  Musculoskeletal: mild arthritis, no joint swelling, no myalgias, no difficulty walking, no mobility   Skin:   no rash, no itching, no skin infections, no pressure sores or ulcerations  Psych:   no anxiety, no depression, no nervousness, no unusual recent stress  Eyes:   no blurry vision, no floaters, no recent vision changes, + wears glasses or contacts  ENT:   no hearing loss, no loose or painful teeth, no dentures, last saw dentist several years ago  Hematologic:  no easy bruising, no abnormal bleeding, no clotting disorder, no frequent epistaxis  Endocrine:  + diabetes, does not check CBG's at home     Physical Exam:   BP 126/81 mmHg  Pulse 87  Temp(Src) 97.4 F (36.3 C) (Oral)  Resp 23  Ht '5\' 10"'$  (1.778 m)  Wt 177 lb 7.5 oz (80.5 kg)  BMI 25.46 kg/m2  SpO2 95%  General:  Thin elderly man in  NAD   HEENT:  Unremarkable   Neck:   no JVD, no bruits, no adenopathy   Chest:   clear to auscultation, symmetrical breath sounds, no wheezes, no rhonchi   CV:   RRR, no murmur   Abdomen:  soft, non-tender, no masses   Extremities:  warm, well-perfused, pulses diminished but palpable, no lower extremity edema  Rectal/GU  Deferred  Neuro:   Grossly non-focal and symmetrical throughout  Skin:   Clean and dry, no rashes, no breakdown  Diagnostic Tests:  Transthoracic Echocardiography  Patient: Alan Ford, Alan Ford MR #: [id number] Study Date: 05/14/2016 Gender: M Age: 35 Height: 170.2 cm Weight: 83.5 kg BSA: 2.01 m^2 Pt. Status: Room: B14C  ORDERING Jenkins Rouge, M.D. ATTENDING Ray, Nigel Sloop ADMITTING Waldemar Dickens SONOGRAPHER Johny Chess, RDCS, CCT PERFORMING [facility],  Inpatient  cc:  ------------------------------------------------------------------- LV EF: 30% - 35%  ------------------------------------------------------------------- Indications: Chest pain 786.51.  ------------------------------------------------------------------- History: PMH: Dyspnea. Chronic obstructive pulmonary disease. Risk factors: Abdominal aortic aneurysm. Obstructive sleep apnea. Hypertension. Diabetes mellitus. Dyslipidemia.  ------------------------------------------------------------------- Study Conclusions  - Left ventricle: The cavity size was moderately dilated. Systolic  function was moderately to severely reduced. The estimated  ejection fraction was in the range of 30% to 35%. Diffuse  hypokinesis. The study is not technically sufficient to allow  evaluation of LV diastolic function. - Aortic valve: Transvalvular velocity was within the normal range.  There was no stenosis. There was no regurgitation. - Aortic root: The aortic root was normal in size. - Mitral valve: There was mild regurgitation. - Left atrium: The atrium was mildly dilated. - Right ventricle: The cavity size was normal. Wall thickness was  normal. Systolic function was normal. - Right atrium: The atrium was normal in size. - Tricuspid valve: There was mild regurgitation. - Pulmonic valve: There was no regurgitation. - Pulmonary arteries: Systolic pressure was within the normal  range. - Inferior vena cava: The vessel was normal in size. - Pericardium, extracardiac: There was no pericardial effusion.  Impressions:  - Compared to the prior study from 2010 LVEF has significantly  decreased now 30-35%, previously 55-60%. There is diffuse  hypokinesis.  Transthoracic echocardiography. M-mode, complete 2D, spectral Doppler, and color Doppler. Birthdate: Patient birthdate: 06-28-1930. Age: Patient is 82 yr old. Sex: Gender: male. BMI: 28.8  kg/m^2. Blood pressure: 156/91 Patient status: Inpatient. Study date: Study date: 05/14/2016. Study time: 04:39 PM. Location: Emergency department.  -------------------------------------------------------------------  ------------------------------------------------------------------- Left ventricle: The cavity size was moderately dilated. Systolic function was moderately to severely reduced. The estimated ejection fraction was in the range of 30% to 35%. Diffuse hypokinesis. The study is not technically sufficient to allow evaluation of LV diastolic function.  ------------------------------------------------------------------- Aortic valve: Trileaflet; mildly thickened, mildly calcified leaflets. Mobility was not restricted. Sclerosis without stenosis. Doppler: Transvalvular velocity was within the normal range. There was no stenosis. There was no regurgitation.  ------------------------------------------------------------------- Aorta: Aortic root: The aortic root was normal in size.  ------------------------------------------------------------------- Mitral valve: Mildly thickened leaflets . Mobility was not restricted. Doppler: Transvalvular velocity was within the normal range. There was no evidence for stenosis. There was mild regurgitation.  ------------------------------------------------------------------- Left atrium: The atrium was mildly dilated.  ------------------------------------------------------------------- Right ventricle: The cavity size was normal. Wall thickness was normal. Systolic function was normal.  ------------------------------------------------------------------- Pulmonic valve: Structurally normal valve. Cusp separation was normal. Doppler: Transvalvular velocity was within the normal range. There was no evidence for stenosis. There was  no regurgitation.  ------------------------------------------------------------------- Tricuspid valve: Structurally normal valve. Doppler: Transvalvular  velocity was within the normal range. There was mild regurgitation.  ------------------------------------------------------------------- Pulmonary artery: The main pulmonary artery was normal-sized. Systolic pressure was within the normal range.  ------------------------------------------------------------------- Right atrium: The atrium was normal in size.  ------------------------------------------------------------------- Pericardium: There was no pericardial effusion.  ------------------------------------------------------------------- Systemic veins: Inferior vena cava: The vessel was normal in size.  ------------------------------------------------------------------- Measurements  Left ventricle Value Reference LV ID, ED, PLAX chordal (H) 59.4 mm 43 - 52 LV ID, ES, PLAX chordal (H) 52.1 mm 23 - 38 LV fx shortening, PLAX chordal (L) 12 % >=29 LV PW thickness, ED 9.99 mm --------- IVS/LV PW ratio, ED 0.91 <=1.3  Ventricular septum Value Reference IVS thickness, ED 9.05 mm ---------  LVOT Value Reference LVOT ID, S 26 mm --------- LVOT area 5.31 cm^2 ---------  Aorta Value Reference Aortic root ID, ED 38 mm ---------  Left atrium Value Reference LA ID, A-P, ES 40 mm --------- LA ID/bsa, A-P 1.99 cm/m^2 <=2.2 LA volume,  S 67.9 ml --------- LA volume/bsa, S 33.8 ml/m^2 --------- LA volume, ES, 1-p A4C 62.1 ml --------- LA volume/bsa, ES, 1-p A4C 30.9 ml/m^2 --------- LA volume, ES, 1-p A2C 74.3 ml --------- LA volume/bsa, ES, 1-p A2C 37 ml/m^2 ---------  Systemic veins Value Reference Estimated CVP 3 mm Hg ---------  Right ventricle Value Reference RV s&', lateral, S 11.3 cm/s ---------  Legend: (L) and (H) mark values outside specified reference range.  ------------------------------------------------------------------- Prepared and Electronically Authenticated by  Ena Dawley, M.D. 2017-05-15T17:44:47   CARDIAC CATHETERIZATION Procedures    Right/Left Heart Cath and Coronary Angiography    Conclusion    1. Mid RCA to Dist RCA lesion, 95% stenosed. 2. Ost Cx to Mid Cx lesion, 85% stenosed. 3. Mid LAD to Dist LAD lesion, 90% stenosed.   Severe, calcified, three-vessel coronary artery disease.  Segmental 95% stenosis in the mid RCA. This is a dominant vessel. There is significant calcification throughout the proximal to distal RCA.  Segmental 50-90% stenosis throughout the mid LAD within a heavily calcified segment.  Segmental proximal to mid heavily calcified 85% circumflex stenosis.  Elevated left ventricular end-diastolic pressure and pulmonary capillary wedge, 20 and 24 mmHg respectively. Echo LVEF is 35%.   RECOMMENDATIONS:   Review images with colleagues and treating team.  PCI will be high risk([facility]) for multiple reasons including high likelihood of acute kidney injury, need for long stents and rotational atherectomy, left ventricular systolic dysfunction and abnormal hemodynamics, and potential suboptimal stent  expansion due to calcification. If intervention is attempted, femoral approach would be necessary. Aorta is heavily calcified and probably rules out support.  Surgical opinion will be sought before final decision about PCI.  Track kidney function postprocedure.  Nephrology consultation.     Indications    CAD in native artery [I25.10 (ICD-10-CM)]   CHF, stage C, acute, systolic [W23.76 (EGB-15-VV)]    Technique and Indications    The right radial area was sterilely prepped and draped. Intravenous sedation was not administered. Oxygen saturation on 3 L was 88%. 1% Xylocaine was infiltrated to achieve local analgesia. A double wall stick with an angiocath was utilized to obtain intra-arterial access. The modified Seldinger technique was used to place a 61F " Slender" sheath in the right radial artery. Weight based heparin was administered. Coronary angiography was done using 5 F catheters. Right coronary angiography was performed with a JR4. Left ventricular hemodymic recordings and angiography was done using the JR 4 catheter and hand injection. Left coronary angiography was performed with  a JL 3.5 cm.  Right heart catheterization was performed after local Xylocaine infiltration by exchanging a left antecubital IV catheter for a 5 French brachial sheath. 1% Xylocaine local infiltration at the IV site was given prior to sheath exchange. A double glove technique was used. The modified Seldinger technique was employed. After sheath insertion, right heart cath was performed using a 5 French balloon tipped catheter. Pressures were recorded in each chamber. A main pulmonary artery O2 saturation was obtained. A wedge pressure was also measured.   Hemostasis was achieved using a pneumatic band.  During this procedure the patient is administered a total of Versed 0 mg and Fentanyl 0 mcg to achieve and maintain moderate conscious sedation. The patient's heart rate, blood pressure, and oxygen  saturation are monitored continuously during the procedure. The period of conscious sedation is 33 minutes, of which I was present face-to-face 100% of this time.Estimated blood loss <50 mL. There were no immediate complications during the procedure.    Coronary Findings    Dominance: Co-dominant   Left Anterior Descending   . Mid LAD to Dist LAD lesion, 90% stenosed.   . First Diagonal Branch   The vessel is small in size.   Marland Kitchen Second Diagonal Branch   The vessel is small in size.     Left Circumflex   . Ost Cx to Mid Cx lesion, 85% stenosed. The lesion is type C Calcified eccentric.   Marland Kitchen Second Obtuse Marginal Branch   The vessel is moderate in size.   . Third Obtuse Marginal Branch   The vessel is small in size.     Right Coronary Artery   . Mid RCA to Dist RCA lesion, 95% stenosed. The lesion is type C Calcified eccentric.       Right Heart Pressures Hemodynamic findings consistent with mild pulmonary hypertension. Elevated LV EDP consistent with volume overload.    Coronary Diagrams    Diagnostic Diagram            Implants     No implant documentation for this case.    PACS Images    Show images for Cardiac catheterization     Link to Procedure Log    Procedure Log      Hemo Data       Most Recent Value   Fick Cardiac Output  4.83 L/min   Fick Cardiac Output Index  2.43 (L/min)/BSA   RA A Wave  8 mmHg   RA V Wave  7 mmHg   RA Mean  5 mmHg   RV Systolic Pressure  45 mmHg   RV Diastolic Pressure  3 mmHg   RV EDP  8 mmHg   PA Systolic Pressure  46 mmHg   PA Diastolic Pressure  16 mmHg   PA Mean  30 mmHg   PW A Wave  22 mmHg   PW V Wave  21 mmHg   PW Mean  18 mmHg   AO Systolic Pressure  366 mmHg   AO Diastolic Pressure  73 mmHg   AO Mean  294 mmHg   LV Systolic Pressure  765 mmHg   LV Diastolic Pressure  11 mmHg   LV EDP  24 mmHg   Arterial Occlusion Pressure Extended Systolic Pressure  465 mmHg   Arterial  Occlusion Pressure Extended Diastolic Pressure  75 mmHg   Arterial Occlusion Pressure Extended Mean Pressure  104 mmHg   Left Ventricular Apex Extended Systolic Pressure  035 mmHg  Left Ventricular Apex Extended Diastolic Pressure  9 mmHg   Left Ventricular Apex Extended EDP Pressure  24 mmHg   QP/QS  1   TPVR Index  12.38 HRUI   TSVR Index  42.06 HRUI   PVR SVR Ratio  0.12   TPVR/TSVR Ratio  0.29       STS Risk Calculator  Procedure    CABG  Risk of Mortality   16.2% Morbidity or Mortality  52.9% Prolonged LOS   37.2% Short LOS    6.5% Permanent Stroke   3.3% Prolonged Vent Support  36.5% DSW Infection    2.1% Renal Failure    33.9% Reoperation    15.8%    Impression:  Patient has severe three-vessel coronary artery disease with severe left ventricular systolic dysfunction. He presents with a long history of progressive symptoms of exertional shortness of breath consistent with chronic combined systolic and diastolic congestive heart failure. He now presents with a several week history of symptoms of chest pain consistent with unstable angina.  I have personally reviewed the patient's transthoracic echocardiogram and diagnostic cardiac catheterization. He has severe left ventricular systolic dysfunction with ejection fraction estimated 30-35%.  Here was mild mitral regurgitation with no other significant structural heart disease.Catheterization reveals severe three-vessel coronary artery disease including hazy discrete 95% stenosis of the mid right coronary artery and right dominant coronary circulation There is diffuse heavily calcified disease involving the proximal and mid left anterior descending coronary artery.  There is moderate to long segment stenosis of the proximal left circumflex coronary artery. There also appears to be significant calcification in the ascending thoracic aorta.      Plan:  I have reviewed the indications, risks, and potential benefits of  coronary artery bypass grafting with the patient and his wife.  Alternative treatment strategies have been discussed, including the relative risks, benefits and long term prognosis associated with medical therapy, percutaneous coronary intervention, and surgical revascularization.   The various reasons why surgical intervention would be extremely high-risk have been discussed.  The patient's life expectancy regardless of treatment modality has been discussed.  We will obtain a non contrast CT scan of the chest to further evaluate the severity of calcification involving the ascending thoracic aorta. Formal pulmonary function tests will be obtained. The patient's echo and cath films will be reviewed with the multidisciplinary team at specialists before making final recommendations. We will continue to follow closely.  I spent in excess of 120 minutes during the conduct of this hospital consultation and >50% of this time involved direct face-to-face encounter for counseling and/or coordination of the patient's care.   Valentina Gu. Roxy Manns, MD 05/15/2016 6:48 PM

## 2016-05-15 NOTE — H&P (View-Only) (Signed)
[name], male   DOB: 01-18-1930, 82 y.o.   MRN: [id number]    Subjective:  Mild dyspnea  Objective:  Filed Vitals:   05/15/16 0300 05/15/16 0400 05/15/16 0500 05/15/16 0600  BP: 138/96 115/73 139/80 116/82  Pulse: 87 83 87 45  Temp:      TempSrc:      Resp: '19 16 14 23  '$ Height:      Weight:   80.5 kg (177 lb 7.5 oz)   SpO2: 93% 93% 90% 92%    Intake/Output from previous day:  Intake/Output Summary (Last 24 hours) at 05/15/16 0756 Last data filed at 05/15/16 0600  Gross per 24 hour  Intake 1561.34 ml  Output   1280 ml  Net 281.34 ml    Physical Exam: Affect appropriate Chronically ill white male  HEENT: normal Neck supple with no adenopathy JVP normal no bruits no thyromegaly Lungs decreased BS  no wheezing and good diaphragmatic motion Heart:  S1/S2 no murmur, no rub, gallop or click PMI normal Abdomen: benighn, BS positve, no tenderness, no AAA no bruit.  No HSM or HJR Distal pulses intact with no bruits No edema Neuro non-focal Skin warm and dry No muscular weakness   Lab Results: Basic Metabolic Panel:  Recent Labs  05/14/16 1251 05/15/16 0513  NA 138 141  K 4.8 4.5  CL 106 109  CO2 18* 20*  GLUCOSE 250* 129*  BUN 48* 45*  CREATININE 2.60* 2.35*  CALCIUM 9.2 8.6*   Liver Function Tests:  Recent Labs  05/15/16 0513  AST 32  ALT 13*  ALKPHOS 56  BILITOT 0.9  PROT 6.0*  ALBUMIN 2.8*   CBC:  Recent Labs  05/14/16 1012 05/14/16 1251 05/15/16 0513  WBC 9.2 10.3 10.5  NEUTROABS 7.4  --   --   HGB 11.6* 11.7* 10.9*  HCT 34.3* 35.1* 34.0*  MCV 96.9 97.8 99.4  PLT 349.0 268 251   Cardiac Enzymes:  Recent Labs  05/14/16 1917 05/14/16 2042  TROPONINI 0.33* 0.37*   Hemoglobin A1C:  Recent Labs  05/14/16 1916  HGBA1C 7.5*    Imaging: Dg Chest 2 View  05/14/2016  CLINICAL DATA:  Shortness of breath, cough/ congestion, substernal chest pain x3 weeks EXAM: CHEST  2 VIEW COMPARISON:  05/09/2016 FINDINGS:  Chronic interstitial markings with subpleural reticulation/fibrosis, particularly in the right mid lung and left lower lobe. Appearance is similar when compared to 2015, although superimposed bibasilar infection/pneumonia is not entirely excluded. Trace left pleural effusion. No pneumothorax. The heart is normal in size. Degenerative changes of the visualized thoracolumbar spine. IMPRESSION: Chronic interstitial lung disease. Superimposed mild bibasilar infection/pneumonia is possible. Trace left pleural effusion. Electronically Signed   By: Julian Hy M.D.   On: 05/14/2016 10:27    Cardiac Studies:  ECG:  NSR RBBB lateral T wave changes    Telemetry:  NSR 05/15/2016   Echo: EF 30-35% reviewed   Medications:   . amLODipine  2.5 mg Oral Daily  . amoxicillin-clavulanate  1 tablet Oral Q12H  . aspirin  81 mg Oral Pre-Cath  . aspirin EC  81 mg Oral QHS  . carvedilol  3.125 mg Oral BID WC  . insulin aspart  0-5 Units Subcutaneous QHS  . insulin aspart  0-9 Units Subcutaneous TID WC  . isosorbide dinitrate  10 mg Oral BID  . simvastatin  20 mg Oral QHS  . sodium chloride flush  3 mL Intravenous Q12H  . sodium chloride flush  3 mL Intravenous Q12H  . tamsulosin  0.4 mg Oral QHS     . sodium chloride    . sodium chloride 100 mL/hr at 05/15/16 0417  . sodium chloride     Followed by  . sodium chloride    . heparin 1,000 Units/hr (05/15/16 0420)    Assessment/Plan:  CHF:  New onset elevated BNP EF 30-35% by echo. Diuretics held for cath. Will likely need lasix post cath Continue Nitrates and beta blocker  CRF:  Would have Dr Clover Mealy and renal see post cath.  Hydrated and HCO3 given Cr improved 2.6->2.3 likely related to MGUS  Chest Pain: with elevated troponin and ECG changes. Long discussion with patient last night about need for cath despite CRF He understands risk of worsening renal function. Discussed with Dr Tamala Julian who will do right and left cath today with minimal dye And  no LV gram  Pulmonary:  Severe COPD previous heavy smoker.  3L [place] CPAP at home continue antibiotics f/u CXR 48 hrs   Alan Ford 05/15/2016, 7:56 AM

## 2016-05-15 NOTE — Progress Notes (Addendum)
[name], male   DOB: 05/05/1930, 83 y.o.   MRN: [id number]    PROGRESS NOTE    Alan Ford  HER:[id number] DOB: 05-31-1930 DOA: 05/14/2016  PCP: Mathews Argyle, MD   Brief Narrative:  82 y.o. male with known diabetes, HTN, HLD, CHF, emphysema/COPD, CKD, A. fib, OSA on CPAP, diastolic CHF, presenting with 3 days history of intermittent and substernal chest pain.  In ED, patient found to have an NSTEMI. Cardiology was consulted and patient was immediately started on a heparin drip.  Assessment & Plan:   Acute systolic CHF - EF 85-63% by echo. 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   Objective: Filed Vitals:   05/15/16 0600 05/15/16 0758 05/15/16 0840 05/15/16 1237  BP: 116/82  145/85   Pulse: 45  89   Temp:   98.3 F (36.8 C)   TempSrc:   Oral   Resp: 23  15   Height:      Weight:      SpO2: 92% 94% 90% 90%    Intake/Output Summary (Last 24 hours) at 05/15/16 1315 Last data filed at 05/15/16 1102  Gross per 24 hour  Intake 2115.02 ml  Output   2130 ml  Net -14.98 ml   Filed Weights   05/14/16 1250 05/14/16  1800 05/15/16 0500  Weight: 83.462 kg (184 lb) 80.5 kg (177 lb 7.5 oz) 80.5 kg (177 lb 7.5 oz)    Examination:  General exam: Appears calm and comfortable  Respiratory system: Clear to auscultation. Respiratory effort normal. Cardiovascular system: S1 & S2 heard, RRR. No JVD, murmurs, rubs, gallops or clicks. No pedal edema. Gastrointestinal system: Abdomen is nondistended, soft and nontender.  Central nervous system: Alert and oriented. No focal neurological deficits. Extremities: Symmetric 5 x 5 power. Skin: No rashes, lesions or ulcers Psychiatry: Judgement and insight appear normal. Mood & affect appropriate.    Data Reviewed: I have personally reviewed following labs and imaging studies  CBC:  Recent Labs Lab 05/14/16 1012 05/14/16 1251 05/15/16 0513  WBC 9.2 10.3 10.5  NEUTROABS 7.4  --   --   HGB 11.6* 11.7* 10.9*  HCT 34.3* 35.1* 34.0*  MCV 96.9 97.8 99.4  PLT 349.0 268 149   Basic Metabolic Panel:  Recent Labs Lab 05/14/16 1012 05/14/16 1251 05/15/16 0513  NA 136 138 141  K 5.1 4.8 4.5  CL 105 106 109  CO2 21 18* 20*  GLUCOSE 251* 250* 129*  BUN 49* 48* 45*  CREATININE 2.32* 2.60* 2.35*  CALCIUM 9.3 9.2 8.6*   GFR: Estimated Creatinine Clearance: 24.2 mL/min (by C-G formula based on Cr of 2.35). Liver Function Tests:  Recent Labs Lab 05/15/16 0513  AST 32  ALT 13*  ALKPHOS 56  BILITOT 0.9  PROT 6.0*  ALBUMIN 2.8*   Coagulation Profile:  Recent Labs Lab 05/14/16 1251  INR 1.10   Cardiac Enzymes:  Recent Labs Lab 05/14/16 1917 05/14/16 2042  TROPONINI 0.33* 0.37*   BNP (last 3 results)  Recent Labs  05/14/16 1012  PROBNP 1454.0*   HbA1C:  Recent Labs  05/14/16 1916  HGBA1C 7.5*   CBG:  Recent Labs Lab 05/14/16 1800 05/14/16 2107 05/15/16 0839  GLUCAP 205* 197* 163*   Urine analysis:    Component Value Date/Time   COLORURINE YELLOW 10/06/2009 0923   APPEARANCEUR CLOUDY* 10/06/2009 0923   LABSPEC 1.014  10/06/2009 0923   PHURINE 5.5 10/06/2009 0923   GLUCOSEU NEGATIVE 10/06/2009 0923   HGBUR TRACE* 10/06/2009 0923   BILIRUBINUR NEGATIVE 10/06/2009 0923   KETONESUR TRACE* 10/06/2009 0923   PROTEINUR 100* 10/06/2009 0923   UROBILINOGEN 0.2 10/06/2009 0923   NITRITE NEGATIVE 10/06/2009 0923   LEUKOCYTESUR SMALL* 10/06/2009 0923   Recent Results (from the past 240 hour(s))  MRSA PCR Screening     Status: None   Collection Time: 05/14/16  6:01 PM  Result Value Ref Range Status   MRSA by PCR NEGATIVE NEGATIVE Final    Comment:        The GeneXpert MRSA Assay (FDA approved for NASAL specimens only), is one component of a comprehensive MRSA colonization surveillance program. It is not intended to diagnose MRSA infection nor to guide or monitor treatment for MRSA infections.       Radiology Studies: Dg Chest 2 View  Chronic interstitial lung disease. Superimposed mild bibasilar infection/pneumonia is possible. Trace left pleural effusion.      Scheduled Meds: . [MAR Hold] amLODipine  2.5 mg Oral Daily  . [MAR Hold] amoxicillin-clavulanate  1 tablet Oral Q12H  . [MAR Hold] aspirin EC  81 mg Oral QHS  . [MAR Hold] carvedilol  3.125 mg Oral BID WC  . [MAR Hold] insulin aspart  0-5 Units Subcutaneous QHS  . [MAR Hold] insulin aspart  0-9 Units Subcutaneous TID WC  . [MAR Hold] isosorbide dinitrate  10 mg Oral BID  . [MAR Hold] simvastatin  20 mg Oral QHS  . [MAR Hold] sodium chloride flush  3 mL Intravenous Q12H  . sodium chloride flush  3 mL Intravenous Q12H  . [MAR Hold] tamsulosin  0.4 mg Oral QHS   Continuous Infusions: . sodium chloride    . sodium chloride 100 mL/hr at 05/15/16 0417  . sodium chloride    . heparin Stopped (05/15/16 1155)    LOS: 1 day   Time spent: 20 minutes   Faye Ramsay, MD [facility] Pager [phone]  If 7PM-7AM, please contact night-coverage [email or web address] Password [email or web address] 05/15/2016, 1:15 PM

## 2016-05-15 NOTE — Interval H&P Note (Signed)
Cath Lab Visit (complete for each Cath Lab visit)  Clinical Evaluation Leading to the Procedure:   ACS: Yes.    Non-ACS:    Anginal Classification: CCS III  Anti-ischemic medical therapy: Maximal Therapy (2 or more classes of medications)  Non-Invasive Test Results: No non-invasive testing performed  Prior CABG: No previous CABG      History and Physical Interval Note:  05/15/2016 11:57 AM  Alan Ford  has presented today for surgery, with the diagnosis of cp  The various methods of treatment have been discussed with the patient and family. After consideration of risks, benefits and other options for treatment, the patient has consented to  Procedure(s): Right/Left Heart Cath and Coronary Angiography (N/A) as a surgical intervention .  The patient's history has been reviewed, patient examined, no change in status, stable for surgery.  I have reviewed the patient's chart and labs.  Questions were answered to the patient's satisfaction.     Belva Crome III

## 2016-05-16 ENCOUNTER — Encounter (HOSPITAL_COMMUNITY): Payer: Self-pay | Admitting: Thoracic Surgery (Cardiothoracic Vascular Surgery)

## 2016-05-16 ENCOUNTER — Inpatient Hospital Stay (HOSPITAL_COMMUNITY): Payer: Medicare Other

## 2016-05-16 ENCOUNTER — Other Ambulatory Visit: Payer: Self-pay | Admitting: *Deleted

## 2016-05-16 DIAGNOSIS — I2510 Atherosclerotic heart disease of native coronary artery without angina pectoris: Secondary | ICD-10-CM

## 2016-05-16 DIAGNOSIS — I25110 Atherosclerotic heart disease of native coronary artery with unstable angina pectoris: Secondary | ICD-10-CM

## 2016-05-16 DIAGNOSIS — J8410 Pulmonary fibrosis, unspecified: Secondary | ICD-10-CM

## 2016-05-16 DIAGNOSIS — I700 Atherosclerosis of aorta: Secondary | ICD-10-CM

## 2016-05-16 DIAGNOSIS — J9611 Chronic respiratory failure with hypoxia: Secondary | ICD-10-CM

## 2016-05-16 DIAGNOSIS — R911 Solitary pulmonary nodule: Secondary | ICD-10-CM

## 2016-05-16 DIAGNOSIS — J432 Centrilobular emphysema: Secondary | ICD-10-CM

## 2016-05-16 HISTORY — DX: Atherosclerosis of aorta: I70.0

## 2016-05-16 LAB — CBC
HCT: 33.1 % — ABNORMAL LOW (ref 39.0–52.0)
Hemoglobin: 10.7 g/dL — ABNORMAL LOW (ref 13.0–17.0)
MCH: 32 pg (ref 26.0–34.0)
MCHC: 32.3 g/dL (ref 30.0–36.0)
MCV: 99.1 fL (ref 78.0–100.0)
PLATELETS: 220 10*3/uL (ref 150–400)
RBC: 3.34 MIL/uL — ABNORMAL LOW (ref 4.22–5.81)
RDW: 14 % (ref 11.5–15.5)
WBC: 10.9 10*3/uL — ABNORMAL HIGH (ref 4.0–10.5)

## 2016-05-16 LAB — BLOOD GAS, ARTERIAL
Acid-base deficit: 6.2 mmol/L — ABNORMAL HIGH (ref 0.0–2.0)
BICARBONATE: 17.9 meq/L — AB (ref 20.0–24.0)
Drawn by: 437071
FIO2: 0.32
O2 Content: 3 L/min
O2 SAT: 91.2 %
PCO2 ART: 30.5 mmHg — AB (ref 35.0–45.0)
PH ART: 7.385 (ref 7.350–7.450)
PO2 ART: 64.6 mmHg — AB (ref 80.0–100.0)
Patient temperature: 98.1
TCO2: 18.9 mmol/L (ref 0–100)

## 2016-05-16 LAB — PULMONARY FUNCTION TEST
FEF 25-75 PRE: 0.84 L/s
FEF 25-75 Post: 0.87 L/sec
FEF2575-%CHANGE-POST: 2 %
FEF2575-%Pred-Post: 49 %
FEF2575-%Pred-Pre: 48 %
FEV1-%Change-Post: -2 %
FEV1-%PRED-POST: 69 %
FEV1-%Pred-Pre: 70 %
FEV1-POST: 1.86 L
FEV1-PRE: 1.9 L
FEV1FVC-%Change-Post: 0 %
FEV1FVC-%Pred-Pre: 84 %
FEV6-%Change-Post: 0 %
FEV6-%PRED-POST: 85 %
FEV6-%PRED-PRE: 85 %
FEV6-PRE: 3.05 L
FEV6-Post: 3.07 L
FEV6FVC-%CHANGE-POST: 2 %
FEV6FVC-%PRED-PRE: 103 %
FEV6FVC-%Pred-Post: 105 %
FVC-%CHANGE-POST: -1 %
FVC-%PRED-POST: 81 %
FVC-%Pred-Pre: 82 %
FVC-Post: 3.12 L
FVC-Pre: 3.18 L
POST FEV6/FVC RATIO: 98 %
Post FEV1/FVC ratio: 60 %
Pre FEV1/FVC ratio: 60 %
Pre FEV6/FVC Ratio: 96 %

## 2016-05-16 LAB — BASIC METABOLIC PANEL
ANION GAP: 13 (ref 5–15)
BUN: 30 mg/dL — AB (ref 6–20)
CALCIUM: 8.5 mg/dL — AB (ref 8.9–10.3)
CO2: 19 mmol/L — ABNORMAL LOW (ref 22–32)
Chloride: 109 mmol/L (ref 101–111)
Creatinine, Ser: 1.98 mg/dL — ABNORMAL HIGH (ref 0.61–1.24)
GFR calc Af Amer: 34 mL/min — ABNORMAL LOW (ref >60)
GFR, EST NON AFRICAN AMERICAN: 29 mL/min — AB (ref >60)
GLUCOSE: 186 mg/dL — AB (ref 65–99)
Potassium: 4 mmol/L (ref 3.5–5.1)
Sodium: 141 mmol/L (ref 135–145)

## 2016-05-16 LAB — GLUCOSE, CAPILLARY
GLUCOSE-CAPILLARY: 189 mg/dL — AB (ref 65–99)
GLUCOSE-CAPILLARY: 216 mg/dL — AB (ref 65–99)
Glucose-Capillary: 191 mg/dL — ABNORMAL HIGH (ref 65–99)
Glucose-Capillary: 188 mg/dL — ABNORMAL HIGH (ref 65–99)

## 2016-05-16 LAB — HEPARIN LEVEL (UNFRACTIONATED)
HEPARIN UNFRACTIONATED: 0.27 [IU]/mL — AB (ref 0.30–0.70)
Heparin Unfractionated: 0.28 IU/mL — ABNORMAL LOW (ref 0.30–0.70)

## 2016-05-16 LAB — LEGIONELLA PNEUMOPHILA SEROGP 1 UR AG: L. pneumophila Serogp 1 Ur Ag: NEGATIVE

## 2016-05-16 LAB — PROCALCITONIN: Procalcitonin: 0.18 ng/mL

## 2016-05-16 LAB — PREALBUMIN: Prealbumin: 34.5 mg/dL (ref 18–38)

## 2016-05-16 MED ORDER — SODIUM BICARBONATE 8.4 % IV SOLN
INTRAVENOUS | Status: AC
  Administered 2016-05-17: 12:00:00 via INTRAVENOUS
  Filled 2016-05-16 (×2): qty 1000

## 2016-05-16 MED ORDER — CLOPIDOGREL BISULFATE 75 MG PO TABS
150.0000 mg | ORAL_TABLET | ORAL | Status: AC
  Administered 2016-05-17: 150 mg via ORAL
  Filled 2016-05-16: qty 2

## 2016-05-16 MED ORDER — SODIUM CHLORIDE 0.9 % IV SOLN: 250.0000 mL | INTRAVENOUS | Status: DC | PRN

## 2016-05-16 MED ORDER — ALBUTEROL SULFATE (2.5 MG/3ML) 0.083% IN NEBU
2.5000 mg | INHALATION_SOLUTION | Freq: Once | RESPIRATORY_TRACT | Status: AC
  Administered 2016-05-16: 2.5 mg via RESPIRATORY_TRACT

## 2016-05-16 MED ORDER — SODIUM CHLORIDE 0.9 % WEIGHT BASED INFUSION
1.0000 mL/kg/h | INTRAVENOUS | Status: DC
  Administered 2016-05-16: 1 mL/kg/h via INTRAVENOUS

## 2016-05-16 MED ORDER — CLOPIDOGREL BISULFATE 75 MG PO TABS: 75.0000 mg | ORAL_TABLET | Freq: Every day | ORAL | Status: DC

## 2016-05-16 MED ORDER — SODIUM BICARBONATE BOLUS VIA INFUSION
INTRAVENOUS | Status: AC
  Administered 2016-05-17: 12:00:00 via INTRAVENOUS
  Filled 2016-05-16: qty 1

## 2016-05-16 MED ORDER — SODIUM CHLORIDE 0.9% FLUSH: 3.0000 mL | INTRAVENOUS | Status: DC | PRN

## 2016-05-16 MED ORDER — SODIUM CHLORIDE 0.9% FLUSH: 3.0000 mL | Freq: Two times a day (BID) | INTRAVENOUS | Status: DC

## 2016-05-16 MED ORDER — ASPIRIN 81 MG PO CHEW
81.0000 mg | CHEWABLE_TABLET | ORAL | Status: AC
  Administered 2016-05-17: 81 mg via ORAL
  Filled 2016-05-16: qty 1

## 2016-05-16 MED ORDER — ASPIRIN 81 MG PO CHEW: 81.0000 mg | CHEWABLE_TABLET | ORAL | Status: DC

## 2016-05-16 MED ORDER — SODIUM CHLORIDE 0.9 % WEIGHT BASED INFUSION: 1.0000 mL/kg/h | INTRAVENOUS | Status: DC

## 2016-05-16 MED ORDER — SODIUM CHLORIDE 0.9% FLUSH
3.0000 mL | Freq: Two times a day (BID) | INTRAVENOUS | Status: DC
  Administered 2016-05-16: 3 mL via INTRAVENOUS

## 2016-05-16 NOTE — Progress Notes (Signed)
OT Cancellation Note  Patient Details Name: Alan Ford MRN: [id number] DOB: 1930-06-28   Cancelled Treatment:    Reason Eval/Treat Not Completed: Fatigue/lethargy limiting ability to participate - Will reattempt.   Darlina Rumpf [place], OTR/L [phone]  05/16/2016, 4:07 PM

## 2016-05-16 NOTE — Progress Notes (Signed)
Sodium bicarbonate for CIN prevention ordered.  To start Nabicarb at 265m/hr x1 hour prior to cath procedure, then 875mhr.  Spoke with RN about starting 1h prior to cath (default time for infusion to start is 0600). To get another IV line.  Dezirae Service D. Shoua Ulloa, PharmD, BCPS Clinical Pharmacist Pager: [phone]/17/2017 10:23 PM

## 2016-05-16 NOTE — Consult Note (Signed)
Name: Alan Ford MRN: [id number] DOB: 1930/11/10    ADMISSION DATE:  05/14/2016 CONSULTATION DATE:  5/17  REFERRING MD :  [facility]   CHIEF COMPLAINT:  Lung nodule   BRIEF PATIENT DESCRIPTION: 82 yo male with hx DM, HTN, CHF, CAD, COPD, CKD 3-4 presented 5/15 with chest pain and EKG changes after being sent from pulmonary office.  Found to have NSTEMI and L heart cath revealed severe three-vessel disease.  He is being evaluated by CVTS for possible CABG and pre-op CT chest revealed L lung nodule for which PCCM has been asked to evaluate.    SIGNIFICANT EVENTS    STUDIES:  CT chest 5/17>>> 1. Findings on this noncontrast exam which are highly suspicious of left lingula malignancy with malignant left hilar and prevascular/AP window lymphadenopathy. A follow-up IV contrast enhanced CT chest would be most valuable at this point for further evaluation. 2. Moderate layering left pleural effusion. 3. Underlying chronic lung disease including emphysema and basilar pulmonary fibrosis. 4. Widespread/severe calcified atherosclerosis of the thoracic aorta and coronary arteries.  L heart cath 5/16>>> Severe, calcified, three-vessel coronary artery disease.  Segmental 95% stenosis in the mid RCA. This is a dominant vessel. There is significant calcification throughout the proximal to distal RCA.  Segmental 50-90% stenosis throughout the mid LAD within a heavily calcified segment.  Segmental proximal to mid heavily calcified 85% circumflex stenosis. Elevated left ventricular end-diastolic pressure and pulmonary capillary wedge, 20 and 24 mmHg respectively. Echo LVEF is 35%.   HISTORY OF PRESENT ILLNESS:  82 yo male with hx DM, HTN, CHF, CAD, COPD, CKD 3-4 presented 5/15 with chest pain and EKG changes after being sent from pulmonary office.  He initially presented to pulmonary office 5/10 with c/o cough, dyspnea, purulent sputum.  CXR showed  LLL infiltrate and he was treated for ?CAP.  He  returned 5/15 with ongoing sob, chest tightness.  EKG done in office revealed ST changes. Pt refused EMS transport but did go to ER as instructed.  Found to have NSTEMI and L heart cath revealed severe three-vessel disease.  He is being evaluated by CVTS for possible CABG and pre-op CT chest revealed L lung nodule for which PCCM has been asked to evaluate.    Currently denies chest pain, SOB above baseline.  Ambulating in room.    PAST MEDICAL HISTORY :   has a past medical history of Diabetes mellitus; High blood pressure; High cholesterol; CHF (congestive heart failure) ([place]); Emphysema; Joint pain; AAA (abdominal aortic aneurysm) ([place]); COPD (chronic obstructive pulmonary disease) ([place]); Chronic kidney disease; Irregular heart beat; PVC (premature ventricular contraction); Shingles; Chronic renal disease, stage III; Sleep apnea; Irregular heart beat; CAD (coronary artery disease); Chronic diastolic heart failure ([place]) (07/24/2011); Atrial fibrillation ([place]); and Aortic calcification ([place]) (05/16/2016).  has past surgical history that includes Penile prosthesis implant (09-2005); Tonsillectomy; Urethrotomy; and Cardiac catheterization (N/A, 05/15/2016). Prior to Admission medications   Medication Sig Start Date End Date Taking? Authorizing Provider  albuterol (PROVENTIL HFA;VENTOLIN HFA) 108 (90 Base) MCG/ACT inhaler Inhale 2 puffs into the lungs every 6 (six) hours as needed for wheezing or shortness of breath. 01/09/16  Yes Collene Gobble, MD  AMLODIPINE BESYLATE PO Take 1 tablet by mouth daily.    Yes Historical Provider, MD  Ascorbic Acid (VITAMIN C) 1000 MG tablet Take 1,000 mg by mouth daily.   Yes Historical Provider, MD  aspirin EC 81 MG tablet Take 81 mg by mouth at bedtime.  Yes Historical Provider, MD  carvedilol (COREG) 3.125 MG tablet Take 3.125 mg by mouth 2 (two) times daily with a meal.   Yes Historical Provider, MD  cephALEXin (KEFLEX) 500 MG capsule Take 500 mg by mouth daily.  03/06/16   Yes Historical Provider, MD  Chromium Picolinate 800 MCG TABS Take 2 tablets by mouth daily.     Yes Historical Provider, MD  Coenzyme Q10 (CO Q-10) 100 MG CAPS Take 100 mg by mouth at bedtime.    Yes Historical Provider, MD  furosemide (LASIX) 20 MG tablet Take 20 mg by mouth 2 (two) times daily.   Yes Historical Provider, MD  glipiZIDE (GLUCOTROL XL) 10 MG 24 hr tablet Take 10 mg by mouth at bedtime.   Yes Historical Provider, MD  glipiZIDE (GLUCOTROL XL) 2.5 MG 24 hr tablet Take 2.5 mg by mouth daily.   Yes Historical Provider, MD  HYDROcodone-acetaminophen (NORCO/VICODIN) 5-325 MG per tablet 1 tablet as needed. 10/26/13  Yes Historical Provider, MD  metFORMIN (GLUCOPHAGE) 1000 MG tablet Take 1,000 mg by mouth 2 (two) times daily with a meal.   Yes Historical Provider, MD  predniSONE (DELTASONE) 10 MG tablet 4 tabs for 2 days, then 3 tabs for 2 days, 2 tabs for 2 days, then 1 tab for 2 days, then stop 05/09/16  Yes Tammy S Parrett, NP  simvastatin (ZOCOR) 20 MG tablet Take 20 mg by mouth at bedtime.  10/21/12  Yes Historical Provider, MD  Tamsulosin HCl (FLOMAX) 0.4 MG CAPS Take 0.4 mg by mouth at bedtime.  10/21/12  Yes Historical Provider, MD  zinc gluconate 50 MG tablet Take 50 mg by mouth at bedtime.    Yes Historical Provider, MD  BAYER CONTOUR TEST test strip  11/07/12   Historical Provider, MD   No Known Allergies  FAMILY HISTORY:  family history includes Cancer in his daughter; Deep vein thrombosis in his mother; Diabetes in his brother, mother, and sister; Heart attack in his father and mother; Heart disease in his brother, father, mother, and sister; Hyperlipidemia in his brother, father, mother, and sister; Hypertension in his brother, father, mother, and sister; Other in his father. SOCIAL HISTORY:  reports that he quit smoking about 30 years ago. His smoking use included Cigarettes. He has a 90 pack-year smoking history. He has quit using smokeless tobacco. His smokeless tobacco use  included Chew. He reports that he does not drink alcohol or use illicit drugs.  REVIEW OF SYSTEMS:   As per HPI - All other systems reviewed and were neg.    SUBJECTIVE:   VITAL SIGNS: Temp:  [97.4 F (36.3 C)-98.1 F (36.7 C)] 97.8 F (36.6 C) (05/17 1134) Pulse Rate:  [80-97] 82 (05/17 1134) Resp:  [12-23] 19 (05/17 1134) BP: (106-148)/(59-100) 115/78 mmHg (05/17 1134) SpO2:  [90 %-96 %] 92 % (05/17 1134) Weight:  [80.513 kg (177 lb 8 oz)] 80.513 kg (177 lb 8 oz) (05/17 0347)  PHYSICAL EXAMINATION: General:  Pleasant, elderly male, NAD  Neuro:  Awake, alert, appropriate, MAE  HEENT:  Mm moist, no JVD  Cardiovascular:  s1s2 rrr Lungs:  resps even non labored on [place], diminished bases L>R  Abdomen:  Soft, non tender, +bs  Musculoskeletal:  Warm and dry, scant BLE edema   Recent Labs Lab 05/14/16 1251 05/15/16 0513 05/16/16 0548  NA 138 141 141  K 4.8 4.5 4.0  CL 106 109 109  CO2 18* 20* 19*  BUN 48* 45* 30*  CREATININE 2.60* 2.35* 1.98*  GLUCOSE 250* 129* 186*    Recent Labs Lab 05/14/16 1251 05/15/16 0513 05/16/16 0548  HGB 11.7* 10.9* 10.7*  HCT 35.1* 34.0* 33.1*  WBC 10.3 10.5 10.9*  PLT 268 251 220   Ct Chest Wo Contrast  05/16/2016  ADDENDUM REPORT: 05/16/2016 09:33 ADDENDUM: Study discussed by telephone with Dr. Doyle Askew on 05/16/2016 at 0926 hours. We discussed follow-up CT chest with IV contrast, and failing that - bronchoscopy. Electronically Signed   By: Genevie Ann M.D.   On: 05/16/2016 09:33  05/16/2016  CLINICAL DATA:  82 year old male with severe coronary artery disease, left ventricular systolic dysfunction with several week progressive symptoms of chest pain, shortness of Breath and combined systolic/diastolic congestive heart failure. Initial encounter. EXAM: CT CHEST WITHOUT CONTRAST TECHNIQUE: Multidetector CT imaging of the chest was performed following the standard protocol without IV contrast. COMPARISON:  Chest radiographs 05/14/2016 and earlier.  High-resolution Chest CT without contrast 06/08/2015. PET-CT 09/27/2015 FINDINGS: Chronic centrilobular emphysema with chronic bibasilar fibrotic changes. Superimposed moderate size layering left pleural effusion (layering to 2-2.5 cm from the lung base to the apex). Superimposed compressive atelectasis along the effusion. However, there is also increased left hilar soft tissue density which is exerting mass effect on the left lingula and lower lobe bronchi (series 3, image 93). This appears to be new since June 2016 and new or increased since September 2016. On soft tissue windows through this area some of the density appears to be hilar vasculature, however, there is definite significant increased in left prevascular and AP window lymph nodes now measuring 15-20 mm short axis individually (9 mm or less in June 2016). There is a superimposed 4 cm lobulated/spiculated opacity in the lingula (series 3, image 118) which is new from prior studies. There is associated adjacent left chest wall pleural thickening (series 2, image 124). Elsewhere there is increased curvilinear opacity along the right major fissure which most resembles atelectasis. There is chronic right apical scarring which is stable. Chronic right middle lobe calcified granuloma. No pericardial effusion. No right pleural effusion. Right peritracheal, right precarinal, and right hilar lymph nodes appear stable since 2016 (including occasional small right hilar calcified nodes. There is severe calcified atherosclerosis of the thoracic aorta. Severe coronary artery calcified plaque also noted. Negative thoracic inlet. No axillary lymphadenopathy. Stable visualized upper abdominal viscera including both adrenal glands. Chronic posterior left third through seventh rib fractures. No acute or suspicious osseous lesion identified. IMPRESSION: 1. Findings on this noncontrast exam which are highly suspicious of left lingula malignancy with malignant left hilar and  prevascular/AP window lymphadenopathy. A follow-up IV contrast enhanced CT chest would be most valuable at this point for further evaluation. 2. Moderate layering left pleural effusion. 3. Underlying chronic lung disease including emphysema and basilar pulmonary fibrosis. 4. Widespread/severe calcified atherosclerosis of the thoracic aorta and coronary arteries. Electronically Signed: By: Genevie Ann M.D. On: 05/16/2016 08:31    ASSESSMENT / PLAN:  Lung nodule -- unclear etiology.  At increased risk for primary malignancy given smoking hx but could also be r/t recent CAP.  COPD   Rec -  Would continue antibiotics and current cardiology w/u.  Can f/u as outpt with pulmonary with outpt f/u CT scan   PCCM singing off please call back if needed.   Nickolas Madrid, NP 05/16/2016  3:00 PM Pager: [phone] or [phone]

## 2016-05-16 NOTE — Progress Notes (Signed)
[name], male   DOB: 08/11/1930, 82 y.o.   MRN: [id number]    PROGRESS NOTE    Alan Ford  SJG:[id number] DOB: [birth year] DOA: 05/14/2016  PCP: Mathews Argyle, MD   Brief Narrative:  82 y.o. male with known diabetes, HTN, HLD, CHF, emphysema/COPD, CKD, A. fib, OSA on CPAP, diastolic CHF, presenting with 3 days history of intermittent and substernal chest pain.  In ED, patient found to have an NSTEMI. Cardiology was consulted and patient was immediately started on a heparin drip.  Assessment & Plan:  ? New lingular mass with lymphadenopathy on CT chest (preliminary report, final report pending) - received call from radiologist on CT chest findings that raise concern of new lung cancer - recommendation is to proceed with CT chest with contrast for clearer evaluation  - Nephrology team was consulted for assistance and I asked Dr. Joelyn Oms to let us know if CT chest with contrast would be OK - if not, pt will need bronch first for further evaluation   Acute systolic CHF - EF 65-46% by echo. 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   Objective: Filed Vitals:   05/15/16 2007 05/16/16 0005 05/16/16 0347 05/16/16 0400  BP: 142/83 123/64 106/59 119/61  Pulse:  93 84 86  Temp: 97.9 F (36.6 C) 98.1 F (36.7 C) 98.1 F (36.7 C)   TempSrc: Oral Oral Oral   Resp: '21 18 14 12  '$ Height:      Weight:   80.513 kg (177 lb 8 oz)   SpO2: 95% 90% 90% 90%    Intake/Output Summary (Last 24 hours) at 05/16/16 0608 Last data filed at 05/16/16 0008  Gross per 24 hour  Intake 1396.66 ml  Output   2725 ml  Net -1328.34 ml   Filed Weights   05/14/16 1800 05/15/16 0500 05/16/16 0347  Weight: 80.5 kg (177 lb 7.5 oz) 80.5 kg (177 lb 7.5 oz) 80.513 kg (177 lb 8 oz)    Examination:  General exam: Appears calm and comfortable  Respiratory system: Respiratory effort normal. Cardiovascular system: S1 & S2 heard, RRR. No JVD, murmurs, rubs, gallops or clicks. No pedal edema. Gastrointestinal system: Abdomen is nondistended, soft and nontender.  Central nervous system: Alert and oriented. No focal neurological deficits. Extremities: Symmetric 5 x 5 power. Skin: No rashes, lesions or ulcers Psychiatry: Judgement and insight appear normal. Mood & affect appropriate.    Data Reviewed: I have personally reviewed following labs and imaging studies  CBC:  Recent Labs Lab 05/14/16 1012 05/14/16 1251 05/15/16 0513 05/16/16 0548  WBC 9.2 10.3 10.5 10.9*  NEUTROABS 7.4  --   --   --   HGB 11.6* 11.7* 10.9* 10.7*  HCT 34.3* 35.1* 34.0* 33.1*  MCV 96.9 97.8 99.4 99.1  PLT 349.0 268 251 474   Basic Metabolic Panel:  Recent Labs Lab 05/14/16 1012 05/14/16 1251 05/15/16 0513  NA 136 138 141  K 5.1 4.8 4.5  CL 105 106 109  CO2 21 18* 20*  GLUCOSE 251* 250* 129*  BUN 49* 48* 45*  CREATININE 2.32* 2.60* 2.35*  CALCIUM 9.3 9.2 8.6*   Liver Function Tests:  Recent Labs Lab 05/15/16 0513  AST 32  ALT 13*  ALKPHOS 56  BILITOT 0.9  PROT 6.0*  ALBUMIN 2.8*    Coagulation Profile:  Recent Labs Lab 05/14/16 1251  INR 1.10   Cardiac Enzymes:  Recent Labs Lab 05/14/16 1917 05/14/16 2042  TROPONINI 0.33* 0.37*   BNP (last 3 results)  Recent Labs  05/14/16 1012  PROBNP 1454.0*   HbA1C:  Recent Labs  05/14/16 1916  HGBA1C 7.5*   CBG:  Recent Labs Lab 05/14/16 1800 05/14/16 2107 05/15/16 0839 05/15/16 1610 05/15/16 2152  GLUCAP 205* 197* 163* 222* 190*   Urine analysis:    Component Value Date/Time   COLORURINE STRAW* 05/15/2016 1927   APPEARANCEUR CLEAR 05/15/2016 1927   LABSPEC 1.016 05/15/2016 1927   PHURINE 6.5 05/15/2016 1927   GLUCOSEU 250* 05/15/2016 1927   HGBUR SMALL* 05/15/2016 1927   BILIRUBINUR NEGATIVE 05/15/2016 [place] NEGATIVE 05/15/2016 1927   PROTEINUR 100* 05/15/2016 1927   UROBILINOGEN 0.2 10/06/2009 0923   NITRITE NEGATIVE 05/15/2016 1927   LEUKOCYTESUR NEGATIVE 05/15/2016 1927   Recent Results (from the past 240 hour(s))  MRSA PCR Screening     Status: None   Collection Time: 05/14/16  6:01 PM  Result Value Ref Range Status   MRSA by PCR NEGATIVE NEGATIVE Final    Comment:        The GeneXpert MRSA Assay (FDA approved for NASAL specimens only), is one component of a comprehensive MRSA colonization surveillance program. It is not intended to diagnose MRSA infection nor to guide or monitor treatment for MRSA infections.   Culture, blood (routine x 2) Call MD if unable to obtain prior to antibiotics being given     Status: None (Preliminary result)   Collection Time: 05/14/16  6:54 PM  Result Value Ref Range Status   Specimen Description BLOOD RIGHT ANTECUBITAL  Final   Special Requests BOTTLES DRAWN AEROBIC AND ANAEROBIC 5CC  Final   Culture NO GROWTH < 24 HOURS  Final   Report Status PENDING  Incomplete  Culture, blood (routine x 2) Call MD if unable to obtain prior to antibiotics being given     Status: None (Preliminary result)   Collection Time: 05/14/16  7:06 PM   Result Value Ref Range Status   Specimen Description BLOOD RIGHT HAND  Final   Special Requests IN PEDIATRIC BOTTLE 2CC  Final   Culture NO GROWTH < 24 HOURS  Final   Report Status PENDING  Incomplete      Radiology Studies: Dg Chest 2 View  Chronic interstitial lung disease. Superimposed mild bibasilar infection/pneumonia is possible. Trace left pleural effusion.      Scheduled Meds: . amLODipine  2.5 mg Oral Daily  . amoxicillin-clavulanate  1 tablet Oral Q12H  . aspirin EC  81 mg  Oral QHS  . carvedilol  3.125 mg Oral BID WC  . clopidogrel  75 mg Oral Q breakfast  . guaiFENesin  600 mg Oral BID  . insulin aspart  0-5 Units Subcutaneous QHS  . insulin aspart  0-9 Units Subcutaneous TID WC  . isosorbide dinitrate  10 mg Oral BID  . simvastatin  20 mg Oral QHS  . sodium chloride flush  3 mL Intravenous Q12H  . sodium chloride flush  3 mL Intravenous Q12H  . tamsulosin  0.4 mg Oral QHS   Continuous Infusions: . heparin 1,000 Units/hr (05/15/16 2256)    LOS: 2 days   Time spent: 20 minutes   Faye Ramsay, MD [facility] Pager [phone]  If 7PM-7AM, please contact night-coverage [email or web address] Password [email or web address] 05/16/2016, 6:08 AM

## 2016-05-16 NOTE — Progress Notes (Signed)
ANTICOAGULATION CONSULT NOTE - Follow-up Consult  Pharmacy Consult for heparin Indication: severe 3V CAD - awaiting CABG 5/18  No Known Allergies  Patient Measurements: Height: '5\' 10"'$  (177.8 cm) Weight: 177 lb 8 oz (80.513 kg) IBW/kg (Calculated) : 73 Heparin Dosing Weight: 80 kg  Vital Signs: Temp: 98.1 F (36.7 C) (05/17 0347) Temp Source: Oral (05/17 0347) BP: 119/61 mmHg (05/17 0400) Pulse Rate: 86 (05/17 0400)  Labs:  Recent Labs  05/14/16 1012 05/14/16 1251 05/14/16 1917 05/14/16 2042 05/15/16 0513 05/16/16 0548  HGB 11.6* 11.7*  --   --  10.9* 10.7*  HCT 34.3* 35.1*  --   --  34.0* 33.1*  PLT 349.0 268  --   --  251 220  LABPROT  --  14.3  --   --   --   --   INR  --  1.10  --   --   --   --   HEPARINUNFRC  --   --   --   --   --  0.27*  CREATININE 2.32* 2.60*  --   --  2.35*  --   TROPONINI  --   --  0.33* 0.37*  --   --     Estimated Creatinine Clearance: 24.2 mL/min (by C-G formula based on Cr of 2.35).   Assessment: 82 y/o M on heparin for severe 3V CAD - awaiting CABG 5/18. Heparin level subtherapeutic on 1000 units/hr. CBC stable. No issues with line or bleeding reported per RN.  Goal of Therapy:  HL 0.3 - 0.7 Monitor platelets by anticoagulation protocol: yes   Plan:  Increase heparin gtt to 1150 units/hr 8 hr HL  Sherlon Handing, PharmD, BCPS Clinical pharmacist, pager [phone] 05/16/2016,6:21 AM

## 2016-05-16 NOTE — Consult Note (Signed)
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  Coronary angiography performed on 05/15/16 demonstrated severe three-vessel calcific coronary disease.  He has been seen by cardiac surgery and is felt to be an excessive risk due to his multiple comorbidities including porcelain aorta.  In the process of evaluating for possible coronary bypass surgery, a left lung mass has also been identified and is suspicious for malignancy.  Kidney function today is slightly improved with creatinine of 2.0.  After speaking with all colleagues involved, we feel the most reasonable revascularization approach for the patient's severe angina pectoris is staged PCI procedures, attempting to minimize contrast exposure, and treating the most severe disease first in attempt to bring symptoms under control.  We plan to proceed with rotational atherectomy and stenting of the right coronary artery tomorrow.  Alan Ford conversation with the patient and his wife discussing procedural risk and expected outcome was undertaken this evening. I estimated a threefold increase risk of acute ischemic complications, death, and CHF. The patient and wife understand that if the procedure fails, emergency surgical backup is not possible. If the treatment vessel closes there would be a relatively high risk of death. The patient understands these considerations and is willing to proceed in an attempt to "live and have a better quality of life". Greatest risk will be that of acute kidney injury that could eventuate in dialysis. We will do everything possible to minimize contrast load but not compromise the procedure due to inadequate visualization of treatment response.

## 2016-05-16 NOTE — Progress Notes (Signed)
CARDIAC REHAB PHASE I   PRE:  Rate/Rhythm: 89 afib with PVCs    BP: sitting 92/77    SaO2: 94 3L  MODE:  Ambulation: 350 ft   POST:  Rate/Rhythm: 101 afib with PVCs    BP: sitting 98/72     SaO2: 86 4L, 91 4L with rest  Able to stand independently and walk with RW for safety. Used 4L O2. Pt talked entire walk and DOE toward end with SAO2 86 4L. Up to 91 4L with rest. After walk pt admitted to light chest tightness that has been there for days. Did not increase with walking. Left in bed for a nap.  [place], ACSM 05/16/2016 2:28 PM

## 2016-05-16 NOTE — Progress Notes (Signed)
[name], male   DOB: 1930/05/15, 82 y.o.   MRN: [id number]    Subjective:  No complaints urinating well   Objective:  Filed Vitals:   05/16/16 0347 05/16/16 0400 05/16/16 0751 05/16/16 0813  BP: 106/59 119/61 117/84 148/81  Pulse: 84 86 92 97  Temp: 98.1 F (36.7 C)  98.1 F (36.7 C)   TempSrc: Oral  Oral   Resp: '14 12 17   '$ Height:      Weight: 80.513 kg (177 lb 8 oz)     SpO2: 90% 90% 92%     Intake/Output from previous day:  Intake/Output Summary (Last 24 hours) at 05/16/16 0956 Last data filed at 05/16/16 0756  Gross per 24 hour  Intake 1196.49 ml  Output   2275 ml  Net -1078.51 ml    Physical Exam: Affect appropriate Chronically ill white male  HEENT: normal Neck supple with no adenopathy JVP normal no bruits no thyromegaly Lungs decreased BS  no wheezing and good diaphragmatic motion Heart:  S1/S2 no murmur, no rub, gallop or click PMI normal Abdomen: benighn, BS positve, no tenderness, no AAA no bruit.  No HSM or HJR Distal pulses intact with no bruits No edema Neuro non-focal Skin warm and dry No muscular weakness   Lab Results: Basic Metabolic Panel:  Recent Labs  05/15/16 0513 05/16/16 0548  NA 141 141  K 4.5 4.0  CL 109 109  CO2 20* 19*  GLUCOSE 129* 186*  BUN 45* 30*  CREATININE 2.35* 1.98*  CALCIUM 8.6* 8.5*   Liver Function Tests:  Recent Labs  05/15/16 0513  AST 32  ALT 13*  ALKPHOS 56  BILITOT 0.9  PROT 6.0*  ALBUMIN 2.8*   CBC:  Recent Labs  05/14/16 1012  05/15/16 0513 05/16/16 0548  WBC 9.2  < > 10.5 10.9*  NEUTROABS 7.4  --   --   --   HGB 11.6*  < > 10.9* 10.7*  HCT 34.3*  < > 34.0* 33.1*  MCV 96.9  < > 99.4 99.1  PLT 349.0  < > 251 220  < > = values in this interval not displayed. Cardiac Enzymes:  Recent Labs  05/14/16 1917 05/14/16 2042  TROPONINI 0.33* 0.37*   Hemoglobin A1C:  Recent Labs  05/14/16 1916  HGBA1C 7.5*    Imaging: Dg Chest 2 View  05/14/2016  CLINICAL DATA:   Shortness of breath, cough/ congestion, substernal chest pain x3 weeks EXAM: CHEST  2 VIEW COMPARISON:  05/09/2016 FINDINGS: Chronic interstitial markings with subpleural reticulation/fibrosis, particularly in the right mid lung and left lower lobe. Appearance is similar when compared to 2015, although superimposed bibasilar infection/pneumonia is not entirely excluded. Trace left pleural effusion. No pneumothorax. The heart is normal in size. Degenerative changes of the visualized thoracolumbar spine. IMPRESSION: Chronic interstitial lung disease. Superimposed mild bibasilar infection/pneumonia is possible. Trace left pleural effusion. Electronically Signed   By: Julian Hy M.D.   On: 05/14/2016 10:27   Ct Chest Wo Contrast  05/16/2016  ADDENDUM REPORT: 05/16/2016 09:33 ADDENDUM: Study discussed by telephone with Dr. Doyle Askew on 05/16/2016 at 0926 hours. We discussed follow-up CT chest with IV contrast, and failing that - bronchoscopy. Electronically Signed   By: Genevie Ann M.D.   On: 05/16/2016 09:33  05/16/2016  CLINICAL DATA:  82 year old male with severe coronary artery disease, left ventricular systolic dysfunction with several week progressive symptoms of chest pain, shortness of Breath and combined systolic/diastolic congestive heart failure. Initial  encounter. EXAM: CT CHEST WITHOUT CONTRAST TECHNIQUE: Multidetector CT imaging of the chest was performed following the standard protocol without IV contrast. COMPARISON:  Chest radiographs 05/14/2016 and earlier. High-resolution Chest CT without contrast 06/08/2015. PET-CT 09/27/2015 FINDINGS: Chronic centrilobular emphysema with chronic bibasilar fibrotic changes. Superimposed moderate size layering left pleural effusion (layering to 2-2.5 cm from the lung base to the apex). Superimposed compressive atelectasis along the effusion. However, there is also increased left hilar soft tissue density which is exerting mass effect on the left lingula and lower  lobe bronchi (series 3, image 93). This appears to be new since June 2016 and new or increased since September 2016. On soft tissue windows through this area some of the density appears to be hilar vasculature, however, there is definite significant increased in left prevascular and AP window lymph nodes now measuring 15-20 mm short axis individually (9 mm or less in June 2016). There is a superimposed 4 cm lobulated/spiculated opacity in the lingula (series 3, image 118) which is new from prior studies. There is associated adjacent left chest wall pleural thickening (series 2, image 124). Elsewhere there is increased curvilinear opacity along the right major fissure which most resembles atelectasis. There is chronic right apical scarring which is stable. Chronic right middle lobe calcified granuloma. No pericardial effusion. No right pleural effusion. Right peritracheal, right precarinal, and right hilar lymph nodes appear stable since 2016 (including occasional small right hilar calcified nodes. There is severe calcified atherosclerosis of the thoracic aorta. Severe coronary artery calcified plaque also noted. Negative thoracic inlet. No axillary lymphadenopathy. Stable visualized upper abdominal viscera including both adrenal glands. Chronic posterior left third through seventh rib fractures. No acute or suspicious osseous lesion identified. IMPRESSION: 1. Findings on this noncontrast exam which are highly suspicious of left lingula malignancy with malignant left hilar and prevascular/AP window lymphadenopathy. A follow-up IV contrast enhanced CT chest would be most valuable at this point for further evaluation. 2. Moderate layering left pleural effusion. 3. Underlying chronic lung disease including emphysema and basilar pulmonary fibrosis. 4. Widespread/severe calcified atherosclerosis of the thoracic aorta and coronary arteries. Electronically Signed: By: Genevie Ann M.D. On: 05/16/2016 08:31    Cardiac  Studies:  ECG:  NSR RBBB lateral T wave changes    Telemetry:  NSR 05/16/2016   Echo: EF 30-35% reviewed   Medications:   . amLODipine  2.5 mg Oral Daily  . amoxicillin-clavulanate  1 tablet Oral Q12H  . aspirin EC  81 mg Oral QHS  . carvedilol  3.125 mg Oral BID WC  . clopidogrel  75 mg Oral Q breakfast  . guaiFENesin  600 mg Oral BID  . insulin aspart  0-5 Units Subcutaneous QHS  . insulin aspart  0-9 Units Subcutaneous TID WC  . isosorbide dinitrate  10 mg Oral BID  . simvastatin  20 mg Oral QHS  . sodium chloride flush  3 mL Intravenous Q12H  . sodium chloride flush  3 mL Intravenous Q12H  . tamsulosin  0.4 mg Oral QHS     . heparin 1,150 Units/hr (05/16/16 0640)    Assessment/Plan:  CHF:  New onset elevated BNP EF 30-35% by echo. Diuretics held for angio.  Continue Nitrates and beta blocker  CRF:  Improved post cath only had about 40 cc contrast will need more for intervention  likely related to MGUS  CAD:  Reviewed cath and discussed at length with Dr Tamala Julian and Dr Roxy Manns. Culprit lesion is RCA.  More diffuse LAD disease  and long bifurcation disease in OM.  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  If PFTls bad Really would not be a candidate for CABG.  Plan is to proceed with RCA intervention tomorrow. Possibly stage Off pump LIMA if PFTls not too bad.  Otherwise would Rx medically and see how he does after RCA intervention Pre PCI orders done HCO3 and hydration to continue. Patient in agreement and Dr Stan Head on board  Pulmonary:  Severe COPD previous heavy smoker.  3L [place] CPAP at home continue antibiotics PFTls today   [name] 05/16/2016, 9:56 AM

## 2016-05-16 NOTE — Consult Note (Signed)
Alan Ford Admit Date: 05/14/2016 05/16/2016 Rexene Agent Requesting Physician:  Doyle Askew MD  Reason for Consult:  CKD3-4, NSTEMI, Lung Mass HPI:  82 year old male seen at the request of Dr. Doyle Askew for the above issues. Patient has known CKD 3 with a baseline creatinine around 2. He was seen in our office for the first time on 5/11 with Dr. Moshe Cipro. It is felt that his chronic kidney disease was likely related to his comorbidities including diabetes, hypertension, age, atherosclerotic disease. He was noted to have nephrotic proteinuria. Renal ultrasound earlier in the year was negative for acute structural or obstructive issues. He had been using some nonsteroidals was advised to stop.  Other pertinent history includes type 2 diabetes, hypertension, hyperlipidemia, COPD with chronic oxygen therapy, obstructive sleep apnea on nocturnal C Pap, MGUS, history of abdominal aortic aneurysm, diastolic congestive heart failure.  He was admitted on 5/15 after presenting with chest pain which had failed therapy with steroids and antibiotics. He was found to have a positive troponin with EKG changes including ST depression. He received left heart catheterization yesterday demonstrating three-vessel disease with significant RCA disease. Cardiology has been working with [facility] for an interventional plan, either CABG or PCI. He received a CT of the chest overnight for preoperative evaluation and was found to have a lingular mass with lymphadenopathy concerning for malignancy, not previously identified.  Throughout this course his renal function has remained stable. No edema. He does not have a Foley.  CREATININE (mg/dL)  Date Value  03/12/2016 2.5*  12/12/2015 1.9*  09/26/2015 1.9*  09/13/2015 2.1*   CREATININE, SER (mg/dL)  Date Value  05/16/2016 1.98*  05/15/2016 2.35*  05/14/2016 2.60*  05/14/2016 2.32*  12/02/2015 2.10*  10/10/2009 1.23  10/09/2009 1.00  10/08/2009 0.90  10/07/2009 0.90   10/06/2009 0.97  ] I/Os: I/O last 3 completed shifts: In: 2957.3 [P.O.:420; I.V.:2537.3] Out: 4405 [Urine:4405]  ROS Balance of 12 systems is negative w/ exceptions as above  PMH  Past Medical History  Diagnosis Date  . Diabetes mellitus   . High blood pressure   . High cholesterol   . CHF (congestive heart failure) ([facility])   . Emphysema   . Joint pain   . AAA (abdominal aortic aneurysm) ([place])   . COPD (chronic obstructive pulmonary disease) ([facility])   . Chronic kidney disease   . Irregular heart beat   . PVC (premature ventricular contraction)   . Shingles   . Chronic renal disease, stage III   . Sleep apnea     cpap  . Irregular heart beat   . CAD (coronary artery disease)   . Chronic diastolic heart failure ([place]) 07/24/2011  . Atrial fibrillation ([facility] [place])   . Aortic calcification ([facility]) 05/16/2016    Involving ascending and descending thoracic aorta   PSH  Past Surgical History  Procedure Laterality Date  . Penile prosthesis implant  09-2005  . Tonsillectomy    . Urethrotomy    . Cardiac catheterization N/A 05/15/2016    Procedure: Right/Left Heart Cath and Coronary Angiography;  Surgeon: Belva Crome, MD;  Location: [facility];  Service: Cardiovascular;  Laterality: N/A;   FH  Family History  Problem Relation Age of Onset  . Other Father     AAA  . Heart disease Father     After age 3,   AAA  . Hypertension Father   . Hyperlipidemia Father   . Heart attack Father   . Diabetes Mother  amputation  . Heart disease Mother   . Hypertension Mother   . Hyperlipidemia Mother   . Deep vein thrombosis Mother   . Heart attack Mother   . Diabetes Sister   . Heart disease Sister     Heart Disease before age 80  . Hypertension Sister   . Hyperlipidemia Sister   . Diabetes Brother   . Heart disease Brother   . Hypertension Brother   . Hyperlipidemia Brother   . Cancer Daughter    SH  reports that he quit smoking about 30 years ago. His smoking use  included Cigarettes. He has a 90 pack-year smoking history. He has quit using smokeless tobacco. His smokeless tobacco use included Chew. He reports that he does not drink alcohol or use illicit drugs. Allergies No Known Allergies Home medications Prior to Admission medications   Medication Sig Start Date End Date Taking? Authorizing Provider  albuterol (PROVENTIL HFA;VENTOLIN HFA) 108 (90 Base) MCG/ACT inhaler Inhale 2 puffs into the lungs every 6 (six) hours as needed for wheezing or shortness of breath. 01/09/16  Yes Collene Gobble, MD  AMLODIPINE BESYLATE PO Take 1 tablet by mouth daily.    Yes Historical Provider, MD  Ascorbic Acid (VITAMIN C) 1000 MG tablet Take 1,000 mg by mouth daily.   Yes Historical Provider, MD  aspirin EC 81 MG tablet Take 81 mg by mouth at bedtime.    Yes Historical Provider, MD  carvedilol (COREG) 3.125 MG tablet Take 3.125 mg by mouth 2 (two) times daily with a meal.   Yes Historical Provider, MD  cephALEXin (KEFLEX) 500 MG capsule Take 500 mg by mouth daily.  03/06/16  Yes Historical Provider, MD  Chromium Picolinate 800 MCG TABS Take 2 tablets by mouth daily.     Yes Historical Provider, MD  Coenzyme Q10 (CO Q-10) 100 MG CAPS Take 100 mg by mouth at bedtime.    Yes Historical Provider, MD  furosemide (LASIX) 20 MG tablet Take 20 mg by mouth 2 (two) times daily.   Yes Historical Provider, MD  glipiZIDE (GLUCOTROL XL) 10 MG 24 hr tablet Take 10 mg by mouth at bedtime.   Yes Historical Provider, MD  glipiZIDE (GLUCOTROL XL) 2.5 MG 24 hr tablet Take 2.5 mg by mouth daily.   Yes Historical Provider, MD  HYDROcodone-acetaminophen (NORCO/VICODIN) 5-325 MG per tablet 1 tablet as needed. 10/26/13  Yes Historical Provider, MD  metFORMIN (GLUCOPHAGE) 1000 MG tablet Take 1,000 mg by mouth 2 (two) times daily with a meal.   Yes Historical Provider, MD  predniSONE (DELTASONE) 10 MG tablet 4 tabs for 2 days, then 3 tabs for 2 days, 2 tabs for 2 days, then 1 tab for 2 days, then stop  05/09/16  Yes Tammy S Parrett, NP  simvastatin (ZOCOR) 20 MG tablet Take 20 mg by mouth at bedtime.  10/21/12  Yes Historical Provider, MD  Tamsulosin HCl (FLOMAX) 0.4 MG CAPS Take 0.4 mg by mouth at bedtime.  10/21/12  Yes Historical Provider, MD  zinc gluconate 50 MG tablet Take 50 mg by mouth at bedtime.    Yes Historical Provider, MD  BAYER CONTOUR TEST test strip  11/07/12   Historical Provider, MD    Current Medications Scheduled Meds: . amLODipine  2.5 mg Oral Daily  . amoxicillin-clavulanate  1 tablet Oral Q12H  . aspirin EC  81 mg Oral QHS  . carvedilol  3.125 mg Oral BID WC  . clopidogrel  75 mg Oral Q breakfast  .  guaiFENesin  600 mg Oral BID  . insulin aspart  0-5 Units Subcutaneous QHS  . insulin aspart  0-9 Units Subcutaneous TID WC  . isosorbide dinitrate  10 mg Oral BID  . simvastatin  20 mg Oral QHS  . sodium chloride flush  3 mL Intravenous Q12H  . sodium chloride flush  3 mL Intravenous Q12H  . tamsulosin  0.4 mg Oral QHS   Continuous Infusions: . heparin 1,150 Units/hr (05/16/16 0640)   PRN Meds:.sodium chloride, acetaminophen, albuterol, HYDROcodone-acetaminophen, morphine injection, ondansetron (ZOFRAN) IV, oxyCODONE-acetaminophen, sodium chloride flush  CBC  Recent Labs Lab 05/14/16 1012 05/14/16 1251 05/15/16 0513 05/16/16 0548  WBC 9.2 10.3 10.5 10.9*  NEUTROABS 7.4  --   --   --   HGB 11.6* 11.7* 10.9* 10.7*  HCT 34.3* 35.1* 34.0* 33.1*  MCV 96.9 97.8 99.4 99.1  PLT 349.0 268 251 694   Basic Metabolic Panel  Recent Labs Lab 05/14/16 1012 05/14/16 1251 05/15/16 0513 05/16/16 0548  NA 136 138 141 141  K 5.1 4.8 4.5 4.0  CL 105 106 109 109  CO2 21 18* 20* 19*  GLUCOSE 251* 250* 129* 186*  BUN 49* 48* 45* 30*  CREATININE 2.32* 2.60* 2.35* 1.98*  CALCIUM 9.3 9.2 8.6* 8.5*    Physical Exam  Blood pressure 139/100, pulse 97, temperature 98.1 F (36.7 C), temperature source Oral, resp. rate 17, height '5\' 10"'$  (1.778 m), weight 80.513 kg  (177 lb 8 oz), SpO2 92 %. GEN:  NAD ENT: Ivins in place EYES: EOMI CV: RRR PULM: diminished throughout ABD: s/nt/nd, no bruit SKIN: no rasehs/lesions EXT:no edema  Assessment 37M with CKD3-4, nephrotic proteinuria, admitted with NSTEMI and found to have severe 3V diseae and Lingular lung mass with LAN concerning for malignancy.  1. CKD3-4 at baseline with nephrotic proteinuria 2. NSTEMI, 3V CAD, RCA culprit; High Risk PCI vs CABG 3. Lingular Lung Mass with LAN 4. COPD 5. DM2 6. HTN 7. Anemia,mild 8. sCHF  Plan 1. Very challenging circumstances 2.  I think unwise to proceed with contrasted CT study; favor pulmonary evaluation 3. Either PCI or CABG at very high risk of worsened GFR, possible RRT which patient of course wishes to avoid 4. No medication changes at current time 5. Daily weights, Daily Renal Panel, Strict I/Os, Avoid nephrotoxins (NSAIDs, judicious IV Contrast)  Will follow along   Pearson Grippe MD [phone] pgr 05/16/2016, 11:11 AM

## 2016-05-16 NOTE — Progress Notes (Addendum)
[address]       [place] [address]             [phone]        CARDIOTHORACIC SURGERY PROGRESS NOTE   R1 Day Post-Op Procedure(s) (LRB): Right/Left Heart Cath and Coronary Angiography (N/A)  Subjective: No chest pain.  Feels okay.  Objective: Vital signs: BP Readings from Last 1 Encounters:  05/16/16 110/90   Pulse Readings from Last 1 Encounters:  05/16/16 86   Resp Readings from Last 1 Encounters:  05/16/16 20   Temp Readings from Last 1 Encounters:  05/16/16 97.4 F (36.3 C) Oral    Hemodynamics:    Physical Exam:  Rhythm:   sinus  Breath sounds: clear  Heart sounds:  RRR  Incisions:  n/a  Abdomen:  soft  Extremities:  warm   Intake/Output from previous day: 05/16 0701 - 05/17 0700 In: 1397.3 [P.O.:180; I.V.:1217.3] Out: 3125 [Urine:3125] Intake/Output this shift: Total I/O In: 600 [P.O.:600] Out: 150 [Urine:150]  Lab Results:  CBC: Recent Labs  05/15/16 0513 05/16/16 0548  WBC 10.5 10.9*  HGB 10.9* 10.7*  HCT 34.0* 33.1*  PLT 251 220    BMET:  Recent Labs  05/15/16 0513 05/16/16 0548  NA 141 141  K 4.5 4.0  CL 109 109  CO2 20* 19*  GLUCOSE 129* 186*  BUN 45* 30*  CREATININE 2.35* 1.98*  CALCIUM 8.6* 8.5*     PT/INR:   Recent Labs  05/14/16 1251  LABPROT 14.3  INR 1.10    CBG (last 3)   Recent Labs  05/16/16 0755 05/16/16 1137 05/16/16 1638  GLUCAP 191* 189* 188*    ABG    Component Value Date/Time   PHART 7.385 05/16/2016 0335   PCO2ART 30.5* 05/16/2016 0335   PO2ART 64.6* 05/16/2016 0335   HCO3 17.9* 05/16/2016 0335   TCO2 18.9 05/16/2016 0335   ACIDBASEDEF 6.2* 05/16/2016 0335   O2SAT 91.2 05/16/2016 0335    Pulmonary Function Tests  Baseline      Post-bronchodilator  FVC  3.18 L  (82% predicted) FVC  3.12 L  (81% predicted) FEV1  1.90 L  (70% predicted) FEV1  1.86 L  (69% predicted) FEF25-75 0.84 L  (48% predicted) FEF25-75 0.87 L  (49% predicted)    CT CHEST  WITHOUT CONTRAST  TECHNIQUE: Multidetector CT imaging of the chest was performed following the standard protocol without IV contrast.  COMPARISON: Chest radiographs 05/14/2016 and earlier. High-resolution Chest CT without contrast 06/08/2015. PET-CT 09/27/2015  FINDINGS: Chronic centrilobular emphysema with chronic bibasilar fibrotic changes. Superimposed moderate size layering left pleural effusion (layering to 2-2.5 cm from the lung base to the apex). Superimposed compressive atelectasis along the effusion.  However, there is also increased left hilar soft tissue density which is exerting mass effect on the left lingula and lower lobe bronchi (series 3, image 93). This appears to be new since June 2016 and new or increased since September 2016. On soft tissue windows through this area some of the density appears to be hilar vasculature, however, there is definite significant increased in left prevascular and AP window lymph nodes now measuring 15-20 mm short axis individually (9 mm or less in June 2016).  There is a superimposed 4 cm lobulated/spiculated opacity in the lingula (series 3, image 118) which is new from prior studies. There is associated adjacent left chest wall pleural thickening (series 2, image 124).  Elsewhere there is increased curvilinear opacity along  the right major fissure which most resembles atelectasis. There is chronic right apical scarring which is stable. Chronic right middle lobe calcified granuloma.  No pericardial effusion. No right pleural effusion. Right peritracheal, right precarinal, and right hilar lymph nodes appear stable since 2016 (including occasional small right hilar calcified nodes.  There is severe calcified atherosclerosis of the thoracic aorta. Severe coronary artery calcified plaque also noted.  Negative thoracic inlet. No axillary lymphadenopathy. Stable visualized upper abdominal viscera including both adrenal  glands.  Chronic posterior left third through seventh rib fractures. No acute or suspicious osseous lesion identified.  IMPRESSION: 1. Findings on this noncontrast exam which are highly suspicious of left lingula malignancy with malignant left hilar and prevascular/AP window lymphadenopathy. A follow-up IV contrast enhanced CT chest would be most valuable at this point for further evaluation. 2. Moderate layering left pleural effusion. 3. Underlying chronic lung disease including emphysema and basilar pulmonary fibrosis. 4. Widespread/severe calcified atherosclerosis of the thoracic aorta and coronary arteries.  Electronically Signed: By: Genevie Ann M.D. On: 05/16/2016 08:31   Assessment/Plan: S/P Procedure(s) (LRB): Right/Left Heart Cath and Coronary Angiography (N/A)  Mr. Clingerman has diffuse calcification involving the entire thoracic aorta, including the ascending aorta.  Given this finding within the context of his advanced age and extensive comorbid illnesses, I would not consider him a candidate for CABG using conventional techniques.  I would favor PCI of RCA to stabilize his condition, followed by further workup of left hilar mass and lung mass as an outpatient.  He had a PET/CT scan on 09/27/2015 that did not reveal any areas of increased metabolic activity in the left lung, but the findings on today's CT scan will still need to be evaluated.  Will ask Dr. Lamonte Sakai to get involved.  Mr. Bridgewater would not be considered a candidate for surgical management of primary lung cancer under any circumstances, but the presence of lung cancer would clearly affect his long term prognosis and management strategies for the remainder of his problems.  Assuming that the patient does not have lung cancer, off-pump CABG for LIMA to LAD could be considered in the future if PCI of LAD were felt to be too high risk.   Discussed at length with the patient and his wife.  Discussed via telephone with Dr. Lamonte Sakai  who will arrange for outpatient follow up.  Please call if we can be of further assistance.  I spent in excess of 30 minutes during the conduct of this hospital encounter and >50% of this time involved direct face-to-face encounter with the patient for counseling and/or coordination of their care.   Rexene Alberts, MD 05/16/2016 5:14 PM

## 2016-05-16 NOTE — Progress Notes (Signed)
ANTICOAGULATION CONSULT NOTE - Follow-up Consult  Pharmacy Consult for heparin Indication: severe 3V CAD - awaiting CABG 5/18  No Known Allergies  Patient Measurements: Height: '5\' 10"'$  (177.8 cm) Weight: 177 lb 8 oz (80.513 kg) IBW/kg (Calculated) : 73 Heparin Dosing Weight: 80 kg  Vital Signs: Temp: 97.8 F (36.6 C) (05/17 1134) Temp Source: Oral (05/17 1134) BP: 115/78 mmHg (05/17 1134) Pulse Rate: 82 (05/17 1134)  Labs:  Recent Labs  05/14/16 1251 05/14/16 1917 05/14/16 2042 05/15/16 0513 05/16/16 0548 05/16/16 1453  HGB 11.7*  --   --  10.9* 10.7*  --   HCT 35.1*  --   --  34.0* 33.1*  --   PLT 268  --   --  251 220  --   LABPROT 14.3  --   --   --   --   --   INR 1.10  --   --   --   --   --   HEPARINUNFRC  --   --   --   --  0.27* 0.28*  CREATININE 2.60*  --   --  2.35* 1.98*  --   TROPONINI  --  0.33* 0.37*  --   --   --     Estimated Creatinine Clearance: 28.7 mL/min (by C-G formula based on Cr of 1.98).   Assessment: 82 y/o M on heparin for severe 3V CAD - awaiting CABG 5/18. Heparin level remains subtherapeutic despite a rate increase this morning- no issues with line per RN.  No bleeding noted, CBC stable.  Goal of Therapy:  HL 0.3 - 0.7 Monitor platelets by anticoagulation protocol: yes   Plan:  Increase heparin gtt to 1300 units/hr Daily HL and CBC  Tareva Leske D. Jakylan Ron, PharmD, BCPS Clinical Pharmacist Pager: [phone] 05/16/2016 3:47 PM

## 2016-05-16 NOTE — Progress Notes (Signed)
   Kidney function stable.  Had high filling pressures yesterday.  Needs diuresis, but not so much as to further impair kidney function.  Discuss with Dr. Roxy Manns and team.

## 2016-05-16 NOTE — Evaluation (Signed)
Physical Therapy Evaluation Patient Details Name: Alan Ford MRN: [id number] DOB: 02/27/1930 Today's Date: 05/16/2016   History of Present Illness  82 y.o. male with known diabetes, HTN, HLD, CHF, emphysema/COPD, CKD, A. fib, OSA on CPAP, diastolic CHF, presenting with 3 days history of intermittent and substernal chest pain.Pt with NSTEMI, underwent cath 5/16.   Clinical Impression  Pt admitted with above diagnosis. Pt currently with functional limitations due to the deficits listed below (see PT Problem List). Pt ambulated 350' with min-guard A, occasional staggering gait as pt fatigued as well as 2/4 DOE. Will follow pt acutely but do not anticipate post acute needs at this point.  Pt will benefit from skilled PT to increase their independence and safety with mobility to allow discharge to the venue listed below.       Follow Up Recommendations No PT follow up    Equipment Recommendations  None recommended by PT    Recommendations for Other Services       Precautions / Restrictions Precautions Precautions: None Restrictions Weight Bearing Restrictions: No      Mobility  Bed Mobility               General bed mobility comments: pt received sitting edge of bed  Transfers Overall transfer level: Needs assistance Equipment used: None Transfers: Sit to/from Stand Sit to Stand: Supervision         General transfer comment: pt stands quickly from bed and recliner without need for physical assist, increased WOB, cued to pause before beginning ambulation  Ambulation/Gait Ambulation/Gait assistance: Min guard Ambulation Distance (Feet): 350 Feet Assistive device: None Gait Pattern/deviations: Step-through pattern;Staggering left Gait velocity: WFL Gait velocity interpretation: at or above normal speed for age/gender General Gait Details: pt with good pace but occasional staggering to left with min-guard A, self corrected. With conversation, pt with increasing SOB,  cued to focus on breathing and not talk with ambulation. Remained on 3L O2, sats as low as 88%.   Stairs            Wheelchair Mobility    Modified Rankin (Stroke Patients Only)       Balance Overall balance assessment: Needs assistance Sitting-balance support: No upper extremity supported Sitting balance-Leahy Scale: Normal     Standing balance support: No upper extremity supported Standing balance-Leahy Scale: Good Standing balance comment: limitation evident when pt begins to fatigue, increased reaction time noted for correction of LOB, stepping strategy noted                             Pertinent Vitals/Pain Pain Assessment: No/denies pain    Home Living Family/patient expects to be discharged to:: Private residence Living Arrangements: Spouse/significant other Available Help at Discharge: Family;Available 24 hours/day Type of Home: House Home Access: Level entry     Home Layout: One level Home Equipment: None Additional Comments: pt on 3 L O2 continuous at home    Prior Function Level of Independence: Independent               Hand Dominance        Extremity/Trunk Assessment   Upper Extremity Assessment: Overall WFL for tasks assessed           Lower Extremity Assessment: Overall WFL for tasks assessed      Cervical / Trunk Assessment: Kyphotic  Communication   Communication: No difficulties  Cognition Arousal/Alertness: Awake/alert Behavior During Therapy: WFL for tasks assessed/performed Overall  Cognitive Status: Within Functional Limits for tasks assessed                      General Comments      Exercises        Assessment/Plan    PT Assessment Patient needs continued PT services  PT Diagnosis Abnormality of gait   PT Problem List Cardiopulmonary status limiting activity;Decreased balance;Decreased activity tolerance;Decreased mobility  PT Treatment Interventions Gait training;Functional mobility  training;Therapeutic activities;Therapeutic exercise;Balance training;Patient/family education   PT Goals (Current goals can be found in the Care Plan section) Acute Rehab PT Goals Patient Stated Goal: return home PT Goal Formulation: With patient Time For Goal Achievement: 05/30/16 Potential to Achieve Goals: Good    Frequency Min 3X/week   Barriers to discharge        Co-evaluation               End of Session Equipment Utilized During Treatment: Gait belt;Oxygen Activity Tolerance: Patient tolerated treatment well Patient left: in chair;with call bell/phone within reach Nurse Communication: Mobility status         Time: 9811-9147 PT Time Calculation (min) (ACUTE ONLY): 29 min   Charges:   PT Evaluation $PT Eval Moderate Complexity: 1 Procedure PT Treatments $Gait Training: 8-22 mins   PT G Codes:       Leighton Roach, PT  Acute Rehab Services  [phone]  Leighton Roach 05/16/2016, 11:29 AM

## 2016-05-17 ENCOUNTER — Encounter (HOSPITAL_COMMUNITY)
Admission: EM | Disposition: A | Discharge status: Home Health | Payer: Self-pay | Source: Home / Self Care | Attending: Internal Medicine

## 2016-05-17 DIAGNOSIS — J438 Other emphysema: Secondary | ICD-10-CM

## 2016-05-17 DIAGNOSIS — I2510 Atherosclerotic heart disease of native coronary artery without angina pectoris: Secondary | ICD-10-CM | POA: Insufficient documentation

## 2016-05-17 DIAGNOSIS — I214 Non-ST elevation (NSTEMI) myocardial infarction: Principal | ICD-10-CM

## 2016-05-17 HISTORY — PX: CARDIAC CATHETERIZATION: SHX172

## 2016-05-17 LAB — BASIC METABOLIC PANEL
ANION GAP: 11 (ref 5–15)
BUN: 29 mg/dL — ABNORMAL HIGH (ref 6–20)
CALCIUM: 8.3 mg/dL — AB (ref 8.9–10.3)
CO2: 19 mmol/L — AB (ref 22–32)
Chloride: 108 mmol/L (ref 101–111)
Creatinine, Ser: 1.81 mg/dL — ABNORMAL HIGH (ref 0.61–1.24)
GFR calc Af Amer: 38 mL/min — ABNORMAL LOW (ref >60)
GFR calc non Af Amer: 33 mL/min — ABNORMAL LOW (ref >60)
GLUCOSE: 156 mg/dL — AB (ref 65–99)
Potassium: 3.8 mmol/L (ref 3.5–5.1)
Sodium: 138 mmol/L (ref 135–145)

## 2016-05-17 LAB — POCT ACTIVATED CLOTTING TIME
ACTIVATED CLOTTING TIME: 178 s
Activated Clotting Time: 306 seconds
Activated Clotting Time: 312 seconds
Activated Clotting Time: 219 seconds

## 2016-05-17 LAB — CBC
HCT: 32.2 % — ABNORMAL LOW (ref 39.0–52.0)
HEMOGLOBIN: 10.4 g/dL — AB (ref 13.0–17.0)
MCH: 31.8 pg (ref 26.0–34.0)
MCHC: 32.3 g/dL (ref 30.0–36.0)
MCV: 98.5 fL (ref 78.0–100.0)
PLATELETS: 209 10*3/uL (ref 150–400)
RBC: 3.27 MIL/uL — AB (ref 4.22–5.81)
RDW: 14 % (ref 11.5–15.5)
WBC: 9.8 10*3/uL (ref 4.0–10.5)

## 2016-05-17 LAB — GLUCOSE, CAPILLARY
GLUCOSE-CAPILLARY: 169 mg/dL — AB (ref 65–99)
GLUCOSE-CAPILLARY: 212 mg/dL — AB (ref 65–99)
Glucose-Capillary: 174 mg/dL — ABNORMAL HIGH (ref 65–99)
Glucose-Capillary: 140 mg/dL — ABNORMAL HIGH (ref 65–99)

## 2016-05-17 LAB — PROTIME-INR
INR: 1.08 (ref 0.00–1.49)
PROTHROMBIN TIME: 14.2 s (ref 11.6–15.2)

## 2016-05-17 LAB — TROPONIN I: Troponin I: 0.28 ng/mL — ABNORMAL HIGH (ref <0.031)

## 2016-05-17 LAB — HEPARIN LEVEL (UNFRACTIONATED)
Heparin Unfractionated: 0.35 IU/mL (ref 0.30–0.70)
Heparin Unfractionated: 0.46 IU/mL (ref 0.30–0.70)

## 2016-05-17 SURGERY — CORONARY ATHERECTOMY
Anesthesia: LOCAL

## 2016-05-17 MED ORDER — SODIUM CHLORIDE 0.9 % IV SOLN
INTRAVENOUS | Status: AC
  Administered 2016-05-18: via INTRAVENOUS

## 2016-05-17 MED ORDER — ACETAMINOPHEN 325 MG PO TABS: 650.0000 mg | ORAL_TABLET | ORAL | Status: DC | PRN

## 2016-05-17 MED ORDER — HEPARIN SODIUM (PORCINE) 1000 UNIT/ML IJ SOLN
INTRAMUSCULAR | Status: DC | PRN
  Administered 2016-05-17: 16:00:00 via INTRACORONARY

## 2016-05-17 MED ORDER — WARFARIN SODIUM 7.5 MG PO TABS
7.5000 mg | ORAL_TABLET | Freq: Once | ORAL | Status: AC
  Administered 2016-05-17: 7.5 mg via ORAL
  Filled 2016-05-17: qty 1

## 2016-05-17 MED ORDER — LIDOCAINE HCL (PF) 1 % IJ SOLN
INTRAMUSCULAR | Status: AC
  Filled 2016-05-17: qty 30

## 2016-05-17 MED ORDER — HYDRALAZINE HCL 20 MG/ML IJ SOLN
10.0000 mg | INTRAMUSCULAR | Status: DC | PRN
  Administered 2016-05-17 (×2): 10 mg via INTRAVENOUS
  Filled 2016-05-17 (×2): qty 1

## 2016-05-17 MED ORDER — LIDOCAINE HCL (PF) 1 % IJ SOLN
INTRAMUSCULAR | Status: DC | PRN
  Administered 2016-05-17: 20 mL

## 2016-05-17 MED ORDER — WARFARIN - PHARMACIST DOSING INPATIENT: Freq: Every day | Status: DC

## 2016-05-17 MED ORDER — VERAPAMIL HCL 2.5 MG/ML IV SOLN
INTRAVENOUS | Status: AC
  Filled 2016-05-17: qty 4

## 2016-05-17 MED ORDER — DOPAMINE-DEXTROSE 3.2-5 MG/ML-% IV SOLN
INTRAVENOUS | Status: AC
  Filled 2016-05-17: qty 250

## 2016-05-17 MED ORDER — HEPARIN SODIUM (PORCINE) 1000 UNIT/ML IJ SOLN
INTRAMUSCULAR | Status: AC
  Filled 2016-05-17: qty 1

## 2016-05-17 MED ORDER — FENTANYL CITRATE (PF) 100 MCG/2ML IJ SOLN
INTRAMUSCULAR | Status: AC
  Filled 2016-05-17: qty 2

## 2016-05-17 MED ORDER — IOPAMIDOL (ISOVUE-370) INJECTION 76%
INTRAVENOUS | Status: AC
  Filled 2016-05-17: qty 125

## 2016-05-17 MED ORDER — SODIUM CHLORIDE 0.9 % IV SOLN
250.0000 mg | INTRAVENOUS | Status: DC | PRN
  Administered 2016-05-17 (×2): 1.75 mg/kg/h via INTRAVENOUS

## 2016-05-17 MED ORDER — ONDANSETRON HCL 4 MG/2ML IJ SOLN: 4.0000 mg | Freq: Four times a day (QID) | INTRAMUSCULAR | Status: DC | PRN

## 2016-05-17 MED ORDER — CLOPIDOGREL BISULFATE 75 MG PO TABS
75.0000 mg | ORAL_TABLET | Freq: Every day | ORAL | Status: DC
  Administered 2016-05-18 – 2016-05-22 (×5): 75 mg via ORAL
  Filled 2016-05-17 (×5): qty 1

## 2016-05-17 MED ORDER — BIVALIRUDIN 250 MG IV SOLR
INTRAVENOUS | Status: AC
  Filled 2016-05-17: qty 250

## 2016-05-17 MED ORDER — SODIUM CHLORIDE 0.9% FLUSH
3.0000 mL | Freq: Two times a day (BID) | INTRAVENOUS | Status: DC
  Administered 2016-05-17 – 2016-05-20 (×5): 3 mL via INTRAVENOUS

## 2016-05-17 MED ORDER — CLOPIDOGREL BISULFATE 75 MG PO TABS
ORAL_TABLET | ORAL | Status: AC
  Filled 2016-05-17: qty 1

## 2016-05-17 MED ORDER — INSULIN ASPART 100 UNIT/ML ~~LOC~~ SOLN
0.0000 [IU] | Freq: Three times a day (TID) | SUBCUTANEOUS | Status: DC
  Administered 2016-05-17 – 2016-05-19 (×7): 2 [IU] via SUBCUTANEOUS
  Administered 2016-05-20: 5 [IU] via SUBCUTANEOUS
  Administered 2016-05-20: 2 [IU] via SUBCUTANEOUS
  Administered 2016-05-20: 1 [IU] via SUBCUTANEOUS
  Administered 2016-05-21: 2 [IU] via SUBCUTANEOUS
  Administered 2016-05-21 – 2016-05-22 (×2): 7 [IU] via SUBCUTANEOUS
  Administered 2016-05-22: 2 [IU] via SUBCUTANEOUS

## 2016-05-17 MED ORDER — NOREPINEPHRINE BITARTRATE 1 MG/ML IV SOLN
INTRAVENOUS | Status: AC
  Filled 2016-05-17: qty 4

## 2016-05-17 MED ORDER — IOPAMIDOL (ISOVUE-370) INJECTION 76%
INTRAVENOUS | Status: DC | PRN
  Administered 2016-05-17: 95 mL via INTRAVENOUS

## 2016-05-17 MED ORDER — SODIUM CHLORIDE 0.9% FLUSH
3.0000 mL | INTRAVENOUS | Status: DC | PRN
Start: 2016-05-17 — End: 2016-05-22

## 2016-05-17 MED ORDER — CLOPIDOGREL BISULFATE 300 MG PO TABS
ORAL_TABLET | ORAL | Status: DC | PRN
  Administered 2016-05-17: 150 mg via ORAL

## 2016-05-17 MED ORDER — BIVALIRUDIN BOLUS VIA INFUSION - CUPID
INTRAVENOUS | Status: DC | PRN
  Administered 2016-05-17: 60.825 mg via INTRAVENOUS

## 2016-05-17 MED ORDER — HEPARIN (PORCINE) IN NACL 2-0.9 UNIT/ML-% IJ SOLN
INTRAMUSCULAR | Status: AC
  Filled 2016-05-17: qty 1500

## 2016-05-17 MED ORDER — ATROPINE SULFATE 1 MG/10ML IJ SOSY
PREFILLED_SYRINGE | INTRAMUSCULAR | Status: AC
  Filled 2016-05-17: qty 10

## 2016-05-17 MED ORDER — NITROGLYCERIN 1 MG/10 ML FOR IR/CATH LAB
INTRA_ARTERIAL | Status: AC
  Filled 2016-05-17: qty 10

## 2016-05-17 MED ORDER — ASPIRIN 81 MG PO CHEW
81.0000 mg | CHEWABLE_TABLET | Freq: Every day | ORAL | Status: DC
  Administered 2016-05-17 – 2016-05-22 (×6): 81 mg via ORAL
  Filled 2016-05-17 (×6): qty 1

## 2016-05-17 MED ORDER — HEPARIN (PORCINE) IN NACL 2-0.9 UNIT/ML-% IJ SOLN
INTRAMUSCULAR | Status: DC | PRN
  Administered 2016-05-17: 1500 mL

## 2016-05-17 MED ORDER — FENTANYL CITRATE (PF) 100 MCG/2ML IJ SOLN
INTRAMUSCULAR | Status: DC | PRN
  Administered 2016-05-17: 50 ug via INTRAVENOUS

## 2016-05-17 MED ORDER — CLOPIDOGREL BISULFATE 75 MG PO TABS
150.0000 mg | ORAL_TABLET | Freq: Once | ORAL | Status: AC
  Administered 2016-05-17: 150 mg via ORAL
  Filled 2016-05-17: qty 2

## 2016-05-17 MED ORDER — SODIUM CHLORIDE 0.9 % IV SOLN
250.0000 mL | INTRAVENOUS | Status: DC | PRN
Start: 2016-05-17 — End: 2016-05-22

## 2016-05-17 MED ORDER — NITROGLYCERIN IN D5W 200-5 MCG/ML-% IV SOLN
INTRAVENOUS | Status: AC
  Filled 2016-05-17: qty 250

## 2016-05-17 MED ORDER — CARVEDILOL 3.125 MG PO TABS
3.1250 mg | ORAL_TABLET | Freq: Two times a day (BID) | ORAL | Status: DC
Start: 2016-05-17 — End: 2016-05-22
  Administered 2016-05-17 – 2016-05-22 (×10): 3.125 mg via ORAL
  Filled 2016-05-17 (×10): qty 1

## 2016-05-17 MED ORDER — OXYCODONE-ACETAMINOPHEN 5-325 MG PO TABS
1.0000 | ORAL_TABLET | ORAL | Status: DC | PRN
  Administered 2016-05-17 – 2016-05-21 (×5): 2 via ORAL
  Filled 2016-05-17 (×5): qty 2

## 2016-05-17 SURGICAL SUPPLY — 26 items
BALLN ~~LOC~~ EUPHORA RX 2.75X20 (BALLOONS) ×2
BALLN ~~LOC~~ EUPHORA RX 3.5X20 (BALLOONS) ×2
BALLN ~~LOC~~ EUPHORA RX 4.0X20 (BALLOONS) ×2
BALLOON ~~LOC~~ EUPHORA RX 2.75X20 (BALLOONS) IMPLANT
BALLOON ~~LOC~~ EUPHORA RX 3.5X20 (BALLOONS) IMPLANT
BALLOON ~~LOC~~ EUPHORA RX 4.0X20 (BALLOONS) IMPLANT
BIOFREEDOM 3.0X24 (Permanent Stent) ×1 IMPLANT
BIOFREEDOM 3.5X28 (Permanent Stent) ×2 IMPLANT
BIOFREEDOM 4.0X18 (Permanent Stent) ×1 IMPLANT
CATH ROTALINK PLUS 1.50MM (BURR) ×1 IMPLANT
CATH S G BIP PACING (SET/KITS/TRAYS/PACK) ×1 IMPLANT
CATH VISTA GUIDE 6FR XBRCA (CATHETERS) ×1 IMPLANT
CATH VISTA GUIDE 7FR H STICK (CATHETERS) ×1 IMPLANT
CATH VISTA GUIDE 7FR JR4 (CATHETERS) ×1 IMPLANT
GUIDELINER 6F (CATHETERS) ×1 IMPLANT
KIT ENCORE 26 ADVANTAGE (KITS) ×2 IMPLANT
KIT HEART LEFT (KITS) ×2 IMPLANT
LUBRICANT ROTAGLIDE 20CC VIAL (MISCELLANEOUS) ×2 IMPLANT
PACK CARDIAC CATHETERIZATION (CUSTOM PROCEDURE TRAY) ×2 IMPLANT
SHEATH PINNACLE 6F 10CM (SHEATH) ×1 IMPLANT
SHEATH PINNACLE 7F 10CM (SHEATH) ×1 IMPLANT
TRANSDUCER W/STOPCOCK (MISCELLANEOUS) ×2 IMPLANT
TUBING CIL FLEX 10 FLL-RA (TUBING) ×2 IMPLANT
WIRE ASAHI PROWATER 180CM (WIRE) ×2 IMPLANT
WIRE EMERALD 3MM-J .035X150CM (WIRE) ×1 IMPLANT
WIRE ROTA FLOPPY .009X325CM (WIRE) ×1 IMPLANT

## 2016-05-17 NOTE — H&P (View-Only) (Signed)
[name], male   DOB: January 20, 1930, 82 y.o.   MRN: [id number]    Subjective:  No complaints urinating well with hydration for PCI No CHF despite hydration and low EF He is aware of possible lung cancer  Objective:  Filed Vitals:   05/17/16 0200 05/17/16 0403 05/17/16 0800 05/17/16 0802  BP: 104/92 107/81 109/83 109/83  Pulse: 79 86 85 85  Temp:  98.3 F (36.8 C)  98.2 F (36.8 C)  TempSrc:  Oral  Oral  Resp: '13 15 30 15  '$ Height:      Weight:  81.149 kg (178 lb 14.4 oz)    SpO2: 91% 93% 86% 90%    Intake/Output from previous day:  Intake/Output Summary (Last 24 hours) at 05/17/16 1031 Last data filed at 05/17/16 1000  Gross per 24 hour  Intake 1346.65 ml  Output   1700 ml  Net -353.35 ml    Physical Exam: Affect appropriate Chronically ill white male  HEENT: normal Neck supple with no adenopathy JVP normal no bruits no thyromegaly Lungs decreased BS  no wheezing and good diaphragmatic motion Heart:  S1/S2 no murmur, no rub, gallop or click PMI normal Abdomen: benighn, BS positve, no tenderness, no AAA no bruit.  No HSM or HJR Distal pulses intact with no bruits No edema Neuro non-focal Skin warm and dry No muscular weakness   Lab Results: Basic Metabolic Panel:  Recent Labs  05/16/16 0548 05/17/16 0407  NA 141 138  K 4.0 3.8  CL 109 108  CO2 19* 19*  GLUCOSE 186* 156*  BUN 30* 29*  CREATININE 1.98* 1.81*  CALCIUM 8.5* 8.3*   Liver Function Tests:  Recent Labs  05/15/16 0513  AST 32  ALT 13*  ALKPHOS 56  BILITOT 0.9  PROT 6.0*  ALBUMIN 2.8*   CBC:  Recent Labs  05/16/16 0548 05/17/16 0407  WBC 10.9* 9.8  HGB 10.7* 10.4*  HCT 33.1* 32.2*  MCV 99.1 98.5  PLT 220 209   Cardiac Enzymes:  Recent Labs  05/14/16 1917 05/14/16 2042  TROPONINI 0.33* 0.37*   Hemoglobin A1C:  Recent Labs  05/14/16 1916  HGBA1C 7.5*    Imaging: Ct Chest Wo Contrast  05/16/2016  ADDENDUM REPORT: 05/16/2016 09:33 ADDENDUM: Study  discussed by telephone with Dr. Doyle Askew on 05/16/2016 at 0926 hours. We discussed follow-up CT chest with IV contrast, and failing that - bronchoscopy. Electronically Signed   By: Genevie Ann M.D.   On: 05/16/2016 09:33  05/16/2016  CLINICAL DATA:  82 year old male with severe coronary artery disease, left ventricular systolic dysfunction with several week progressive symptoms of chest pain, shortness of Breath and combined systolic/diastolic congestive heart failure. Initial encounter. EXAM: CT CHEST WITHOUT CONTRAST TECHNIQUE: Multidetector CT imaging of the chest was performed following the standard protocol without IV contrast. COMPARISON:  Chest radiographs 05/14/2016 and earlier. High-resolution Chest CT without contrast 06/08/2015. PET-CT 09/27/2015 FINDINGS: Chronic centrilobular emphysema with chronic bibasilar fibrotic changes. Superimposed moderate size layering left pleural effusion (layering to 2-2.5 cm from the lung base to the apex). Superimposed compressive atelectasis along the effusion. However, there is also increased left hilar soft tissue density which is exerting mass effect on the left lingula and lower lobe bronchi (series 3, image 93). This appears to be new since June 2016 and new or increased since September 2016. On soft tissue windows through this area some of the density appears to be hilar vasculature, however, there is definite significant increased in left  prevascular and AP window lymph nodes now measuring 15-20 mm short axis individually (9 mm or less in June 2016). There is a superimposed 4 cm lobulated/spiculated opacity in the lingula (series 3, image 118) which is new from prior studies. There is associated adjacent left chest wall pleural thickening (series 2, image 124). Elsewhere there is increased curvilinear opacity along the right major fissure which most resembles atelectasis. There is chronic right apical scarring which is stable. Chronic right middle lobe calcified  granuloma. No pericardial effusion. No right pleural effusion. Right peritracheal, right precarinal, and right hilar lymph nodes appear stable since 2016 (including occasional small right hilar calcified nodes. There is severe calcified atherosclerosis of the thoracic aorta. Severe coronary artery calcified plaque also noted. Negative thoracic inlet. No axillary lymphadenopathy. Stable visualized upper abdominal viscera including both adrenal glands. Chronic posterior left third through seventh rib fractures. No acute or suspicious osseous lesion identified. IMPRESSION: 1. Findings on this noncontrast exam which are highly suspicious of left lingula malignancy with malignant left hilar and prevascular/AP window lymphadenopathy. A follow-up IV contrast enhanced CT chest would be most valuable at this point for further evaluation. 2. Moderate layering left pleural effusion. 3. Underlying chronic lung disease including emphysema and basilar pulmonary fibrosis. 4. Widespread/severe calcified atherosclerosis of the thoracic aorta and coronary arteries. Electronically Signed: By: Genevie Ann M.D. On: 05/16/2016 08:31    Cardiac Studies:  ECG:  NSR RBBB lateral T wave changes    Telemetry:  NSR 05/17/2016   Echo: EF 30-35% reviewed   Medications:   . amLODipine  2.5 mg Oral Daily  . amoxicillin-clavulanate  1 tablet Oral Q12H  . aspirin EC  81 mg Oral QHS  . carvedilol  3.125 mg Oral BID WC  . [START ON 05/18/2016] clopidogrel  75 mg Oral Q breakfast  . guaiFENesin  600 mg Oral BID  . insulin aspart  0-5 Units Subcutaneous QHS  . insulin aspart  0-9 Units Subcutaneous TID WC  . isosorbide dinitrate  10 mg Oral BID  . simvastatin  20 mg Oral QHS  . sodium bicarbonate   Intravenous Pre-Cath   And  . sodium bicarbonate (CIN) infusion   Intravenous Pre-Cath  . sodium chloride flush  3 mL Intravenous Q12H  . sodium chloride flush  3 mL Intravenous Q12H  . sodium chloride flush  3 mL Intravenous Q12H  .  tamsulosin  0.4 mg Oral QHS     . sodium chloride 1 mL/kg/hr (05/17/16 0700)  . heparin 1,300 Units/hr (05/17/16 0700)    Assessment/Plan:  CHF:  New onset elevated BNP EF 30-35% by echo. Diuretics held for angio.  Continue Nitrates and beta blocker will consider diuretics post cath/PCI  CRF:  CR 1.8 with HCO3 and NS   likely related to MGUS should be ok for intervention today   CAD:  Reviewed cath and discussed at length with Dr Tamala Julian and Dr Roxy Manns. Culprit lesion is RCA.  More diffuse LAD disease and long bifurcation disease in OM.  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  If PFTls bad Really would not be a candidate for CABG.  Plan is to proceed with RCA intervention tomorrow. Possibly stage Off pump LIMA if PFTls not too bad.  Otherwise would Rx medically and see how he does after RCA intervention Pre PCI orders done HCO3 and hydration to continue. Patient in agreement and Dr Stan Head on board  Pulmonary:  Severe COPD previous heavy smoker.  3L [place] CPAP at  home continue antibiotics PFT;s pending Given likely diagnosis lung CA probably not a surgical candidate. Interesting that PET scan chest normal end  Of last year but will likely need another one have contacted Dr Lamonte Sakai for outpatient f/u and he has an oncologist For his [place] 05/17/2016, 10:31 AM

## 2016-05-17 NOTE — Progress Notes (Signed)
[name], male   DOB: [birth year], 82 y.o.   MRN: [id number]    PROGRESS NOTE    Alan Ford  XNT:[id number] DOB: 1930/08/16 DOA: 05/14/2016  PCP: Mathews Argyle, MD   Brief Narrative:  82 y.o. male with known diabetes, HTN, HLD, CHF, emphysema/COPD, CKD, A. fib, OSA on CPAP, diastolic CHF, presenting with 3 days history of intermittent and substernal chest pain.  In ED, patient found to have an NSTEMI. Cardiology was consulted and patient was immediately started on a heparin drip. Hospital course complicated by finding of new ? Hilar mass worrisome for malignancy. PCCM evaluated pt and recommended outpatient follow up with Dr. Lamonte Sakai and that appointment has been scheduled.   Assessment & Plan:  ? New lingular mass, hilar mass - unclear etiology, at increased risk for primary malignancy given smoking history but could also be due to recent CAP, COPD exacerbation - per PCCM, pt can be re evaluated as an outpatient, if he does need a biopsy then it would be high risk due to CAD, MI and the fact that he will be on antiplatelet agent after the planned cardiac stent - continue Augmentin day #4/7 - per PCCM, pt can f/u as outpt with pulmonary with repeat CT scan  - pt has follow up appt with Dr. Lamonte Sakai   Acute systolic CHF - EF 96-75% by echo. Diuretics held for cath - pt euvolemic at this time  - continue nitrates and beta blocker - appreciate cardiology team following  - continue to follow up on recommendations   CRF, stage III  - Cr improved 2.6->2.3->1.98-->1.89 likely related to MGUS - nephrology team assistance appreciated  - BMP in AM  Chest Pain secondary to NSTEMI - s/p cath - cardiology and CTS on board  - Plan is to proceed with RCA intervention tomorrow  DM type II with complications of CKD stage III  - holding oral antihyperglycemics - continue SSI   Severe COPD  - on 3L [place] and CPAP at home  - no wheezing on exam this AM - continue Augmentin as  noted above   DVT prophylaxis: Heparin  Code Status: Full  Family Communication: Patient at bedside  Disposition Plan: Home when cleared by cardiology team   Consultants:   Cardiology   Nephrology  PCCM  CTS  Procedures:   Cath   Antimicrobials:   Augmentin 5/15 -->    Subjective: Reports feeling better this AM.   Objective: Filed Vitals:   05/17/16 0800 05/17/16 0802 05/17/16 1126 05/17/16 1445  BP: 109/83 109/83 101/73   Pulse: 85 85 76   Temp:  98.2 F (36.8 C) 97.5 F (36.4 C)   TempSrc:  Oral Oral   Resp: '30 15 15   '$ Height:      Weight:      SpO2: 86% 90% 95% 93%    Intake/Output Summary (Last 24 hours) at 05/17/16 1450 Last data filed at 05/17/16 1412  Gross per 24 hour  Intake 1479.75 ml  Output   1825 ml  Net -345.25 ml   Filed Weights   05/15/16 0500 05/16/16 0347 05/17/16 0403  Weight: 80.5 kg (177 lb 7.5 oz) 80.513 kg (177 lb 8 oz) 81.149 kg (178 lb 14.4 oz)    Examination:  General exam: Appears calm and comfortable  Respiratory system: Respiratory effort normal. Diminished breath sounds at bases  Cardiovascular system: S1 & S2 heard, RRR. No JVD, murmurs, rubs, gallops or clicks. No pedal edema. Gastrointestinal  system: Abdomen is nondistended, soft and nontender.  Central nervous system: Alert and oriented. No focal neurological deficits.  Data Reviewed: I have personally reviewed following labs and imaging studies  CBC:  Recent Labs Lab 05/14/16 1012 05/14/16 1251 05/15/16 0513 05/16/16 0548 05/17/16 0407  WBC 9.2 10.3 10.5 10.9* 9.8  NEUTROABS 7.4  --   --   --   --   HGB 11.6* 11.7* 10.9* 10.7* 10.4*  HCT 34.3* 35.1* 34.0* 33.1* 32.2*  MCV 96.9 97.8 99.4 99.1 98.5  PLT 349.0 268 251 220 937   Basic Metabolic Panel:  Recent Labs Lab 05/14/16 1012 05/14/16 1251 05/15/16 0513 05/16/16 0548 05/17/16 0407  NA 136 138 141 141 138  K 5.1 4.8 4.5 4.0 3.8  CL 105 106 109 109 108  CO2 21 18* 20* 19* 19*  GLUCOSE  251* 250* 129* 186* 156*  BUN 49* 48* 45* 30* 29*  CREATININE 2.32* 2.60* 2.35* 1.98* 1.81*  CALCIUM 9.3 9.2 8.6* 8.5* 8.3*   Liver Function Tests:  Recent Labs Lab 05/15/16 0513  AST 32  ALT 13*  ALKPHOS 56  BILITOT 0.9  PROT 6.0*  ALBUMIN 2.8*   Coagulation Profile:  Recent Labs Lab 05/14/16 1251 05/17/16 0407  INR 1.10 1.08   Cardiac Enzymes:  Recent Labs Lab 05/14/16 1917 05/14/16 2042  TROPONINI 0.33* 0.37*   BNP (last 3 results)  Recent Labs  05/14/16 1012  PROBNP 1454.0*   HbA1C:  Recent Labs  05/14/16 1916  HGBA1C 7.5*   CBG:  Recent Labs Lab 05/16/16 1137 05/16/16 1638 05/16/16 2115 05/17/16 0756 05/17/16 1125  GLUCAP 189* 188* 216* 174* 140*   Urine analysis:    Component Value Date/Time   COLORURINE STRAW* 05/15/2016 1927   APPEARANCEUR CLEAR 05/15/2016 1927   LABSPEC 1.016 05/15/2016 1927   PHURINE 6.5 05/15/2016 1927   GLUCOSEU 250* 05/15/2016 1927   HGBUR SMALL* 05/15/2016 1927   BILIRUBINUR NEGATIVE 05/15/2016 1927   KETONESUR NEGATIVE 05/15/2016 1927   PROTEINUR 100* 05/15/2016 1927   UROBILINOGEN 0.2 10/06/2009 0923   NITRITE NEGATIVE 05/15/2016 1927   LEUKOCYTESUR NEGATIVE 05/15/2016 1927   Recent Results (from the past 240 hour(s))  MRSA PCR Screening     Status: None   Collection Time: 05/14/16  6:01 PM  Result Value Ref Range Status   MRSA by PCR NEGATIVE NEGATIVE Final  Culture, blood (routine x 2) Call MD if unable to obtain prior to antibiotics being given     Status: None (Preliminary result)   Collection Time: 05/14/16  6:54 PM  Result Value Ref Range Status   Specimen Description BLOOD RIGHT ANTECUBITAL  Final   Special Requests BOTTLES DRAWN AEROBIC AND ANAEROBIC 5CC  Final   Culture NO GROWTH 3 DAYS  Final   Report Status PENDING  Incomplete  Culture, blood (routine x 2) Call MD if unable to obtain prior to antibiotics being given     Status: None (Preliminary result)   Collection Time: 05/14/16  7:06  PM  Result Value Ref Range Status   Specimen Description BLOOD RIGHT HAND  Final   Special Requests IN PEDIATRIC BOTTLE 2CC  Final   Culture NO GROWTH 3 DAYS  Final   Report Status PENDING  Incomplete      Radiology Studies: Dg Chest 2 View  Chronic interstitial lung disease. Superimposed mild bibasilar infection/pneumonia is possible. Trace left pleural effusion.      Scheduled Meds: . [MAR Hold] amLODipine  2.5 mg Oral Daily  . [  MAR Hold] amoxicillin-clavulanate  1 tablet Oral Q12H  . [MAR Hold] aspirin EC  81 mg Oral QHS  . [MAR Hold] carvedilol  3.125 mg Oral BID WC  . [MAR Hold] clopidogrel  75 mg Oral Q breakfast  . [MAR Hold] guaiFENesin  600 mg Oral BID  . [MAR Hold] insulin aspart  0-5 Units Subcutaneous QHS  . [MAR Hold] insulin aspart  0-9 Units Subcutaneous TID WC  . [MAR Hold] isosorbide dinitrate  10 mg Oral BID  . [MAR Hold] simvastatin  20 mg Oral QHS  . [MAR Hold] sodium chloride flush  3 mL Intravenous Q12H  . [MAR Hold] sodium chloride flush  3 mL Intravenous Q12H  . sodium chloride flush  3 mL Intravenous Q12H  . [MAR Hold] tamsulosin  0.4 mg Oral QHS   Continuous Infusions: . sodium chloride 1.056 mL/kg/hr (05/17/16 1320)  . heparin Stopped (05/17/16 1412)    LOS: 3 days   Time spent: 20 minutes   Faye Ramsay, MD [facility] Pager [phone]  If 7PM-7AM, please contact night-coverage [email or web address] Password [email or web address] 05/17/2016, 2:50 PM

## 2016-05-17 NOTE — Consult Note (Signed)
Name: Alan Ford MRN: [id number] DOB: 18-Oct-1930    ADMISSION DATE:  05/14/2016 CONSULTATION DATE:  5/17  REFERRING MD :  [facility]   CHIEF COMPLAINT:  Lung nodule   BRIEF PATIENT DESCRIPTION: 82 yo male with hx DM, HTN, CHF, CAD, COPD, CKD 3-4 presented 5/15 with chest pain and EKG changes after being sent from pulmonary office.  Found to have NSTEMI and L heart cath revealed severe three-vessel disease.  He is being evaluated by CVTS for possible CABG and pre-op CT chest revealed L lung nodule for which PCCM has been asked to evaluate.    SIGNIFICANT EVENTS   STUDIES:  CT chest 5/17>>> 1. Findings on this noncontrast exam which are highly suspicious of left lingula malignancy with malignant left hilar and prevascular/AP window lymphadenopathy. A follow-up IV contrast enhanced CT chest would be most valuable at this point for further evaluation. 2. Moderate layering left pleural effusion. 3. Underlying chronic lung disease including emphysema and basilar pulmonary fibrosis. 4. Widespread/severe calcified atherosclerosis of the thoracic aorta and coronary arteries.  L heart cath 5/16>>> Severe, calcified, three-vessel coronary artery disease.  Segmental 95% stenosis in the mid RCA. This is a dominant vessel. There is significant calcification throughout the proximal to distal RCA.  Segmental 50-90% stenosis throughout the mid LAD within a heavily calcified segment.  Segmental proximal to mid heavily calcified 85% circumflex stenosis. Elevated left ventricular end-diastolic pressure and pulmonary capillary wedge, 20 and 24 mmHg respectively. Echo LVEF is 35%.   HISTORY OF PRESENT ILLNESS:  82 yo male with hx DM, HTN, CHF, CAD, COPD, CKD 3-4 presented 5/15 with chest pain and EKG changes after being sent from pulmonary office.  He initially presented to pulmonary office 5/10 with c/o cough, dyspnea, purulent sputum.  CXR showed  LLL infiltrate and he was treated for ?CAP.  He returned  5/15 with ongoing sob, chest tightness.  EKG done in office revealed ST changes. Pt refused EMS transport but did go to ER as instructed.  Found to have NSTEMI and L heart cath revealed severe three-vessel disease.  He is being evaluated by CVTS for possible CABG and pre-op CT chest revealed L lung nodule for which PCCM has been asked to evaluate.    Currently denies chest pain, SOB above baseline.  Ambulating in room.    PAST MEDICAL HISTORY :   has a past medical history of Diabetes mellitus; High blood pressure; High cholesterol; CHF (congestive heart failure) ([place]); Emphysema; Joint pain; AAA (abdominal aortic aneurysm) ([place]); COPD (chronic obstructive pulmonary disease) ([place]); Chronic kidney disease; Irregular heart beat; PVC (premature ventricular contraction); Shingles; Chronic renal disease, stage III; Sleep apnea; Irregular heart beat; CAD (coronary artery disease); Chronic diastolic heart failure ([place]) (07/24/2011); Atrial fibrillation ([place]); and Aortic calcification ([place]) (05/16/2016).  has past surgical history that includes Penile prosthesis implant (09-2005); Tonsillectomy; Urethrotomy; and Cardiac catheterization (N/A, 05/15/2016). Prior to Admission medications   Medication Sig Start Date End Date Taking? Authorizing Provider  albuterol (PROVENTIL HFA;VENTOLIN HFA) 108 (90 Base) MCG/ACT inhaler Inhale 2 puffs into the lungs every 6 (six) hours as needed for wheezing or shortness of breath. 01/09/16  Yes Collene Gobble, MD  AMLODIPINE BESYLATE PO Take 1 tablet by mouth daily.    Yes Historical Provider, MD  Ascorbic Acid (VITAMIN C) 1000 MG tablet Take 1,000 mg by mouth daily.   Yes Historical Provider, MD  aspirin EC 81 MG tablet Take 81 mg by mouth at bedtime.    Yes  Historical Provider, MD  carvedilol (COREG) 3.125 MG tablet Take 3.125 mg by mouth 2 (two) times daily with a meal.   Yes Historical Provider, MD  cephALEXin (KEFLEX) 500 MG capsule Take 500 mg by mouth daily.  03/06/16  Yes  Historical Provider, MD  Chromium Picolinate 800 MCG TABS Take 2 tablets by mouth daily.     Yes Historical Provider, MD  Coenzyme Q10 (CO Q-10) 100 MG CAPS Take 100 mg by mouth at bedtime.    Yes Historical Provider, MD  furosemide (LASIX) 20 MG tablet Take 20 mg by mouth 2 (two) times daily.   Yes Historical Provider, MD  glipiZIDE (GLUCOTROL XL) 10 MG 24 hr tablet Take 10 mg by mouth at bedtime.   Yes Historical Provider, MD  glipiZIDE (GLUCOTROL XL) 2.5 MG 24 hr tablet Take 2.5 mg by mouth daily.   Yes Historical Provider, MD  HYDROcodone-acetaminophen (NORCO/VICODIN) 5-325 MG per tablet 1 tablet as needed. 10/26/13  Yes Historical Provider, MD  metFORMIN (GLUCOPHAGE) 1000 MG tablet Take 1,000 mg by mouth 2 (two) times daily with a meal.   Yes Historical Provider, MD  predniSONE (DELTASONE) 10 MG tablet 4 tabs for 2 days, then 3 tabs for 2 days, 2 tabs for 2 days, then 1 tab for 2 days, then stop 05/09/16  Yes Tammy S Parrett, NP  simvastatin (ZOCOR) 20 MG tablet Take 20 mg by mouth at bedtime.  10/21/12  Yes Historical Provider, MD  Tamsulosin HCl (FLOMAX) 0.4 MG CAPS Take 0.4 mg by mouth at bedtime.  10/21/12  Yes Historical Provider, MD  zinc gluconate 50 MG tablet Take 50 mg by mouth at bedtime.    Yes Historical Provider, MD  BAYER CONTOUR TEST test strip  11/07/12   Historical Provider, MD   No Known Allergies  FAMILY HISTORY:  family history includes Cancer in his daughter; Deep vein thrombosis in his mother; Diabetes in his brother, mother, and sister; Heart attack in his father and mother; Heart disease in his brother, father, mother, and sister; Hyperlipidemia in his brother, father, mother, and sister; Hypertension in his brother, father, mother, and sister; Other in his father. SOCIAL HISTORY:  reports that he quit smoking about 30 years ago. His smoking use included Cigarettes. He has a 90 pack-year smoking history. He has quit using smokeless tobacco. His smokeless tobacco use  included Chew. He reports that he does not drink alcohol or use illicit drugs.  REVIEW OF SYSTEMS:   As per HPI - All other systems reviewed and were neg.   SUBJECTIVE:   VITAL SIGNS: Temp:  [97.4 F (36.3 C)-98.9 F (37.2 C)] 98.2 F (36.8 C) (05/18 0802) Pulse Rate:  [79-87] 85 (05/18 0802) Resp:  [13-30] 15 (05/18 0802) BP: (104-122)/(61-92) 109/83 mmHg (05/18 0802) SpO2:  [86 %-93 %] 90 % (05/18 0802) Weight:  [178 lb 14.4 oz (81.149 kg)] 178 lb 14.4 oz (81.149 kg) (05/18 0403)  PHYSICAL EXAMINATION: General:  Pleasant, elderly male, No distress Neuro:  Awake, alert, appropriate, No focal deficits HEENT:  Moist mucus membranes, No thyromegaly, JVD Cardiovascular:  S1, S2, RRR, No MRG Lungs:  resps even non labored on [place], diminished bases L>R  Abdomen:  Soft, non tender, + BS Musculoskeletal:  No edema.   Recent Labs Lab 05/15/16 0513 05/16/16 0548 05/17/16 0407  NA 141 141 138  K 4.5 4.0 3.8  CL 109 109 108  CO2 20* 19* 19*  BUN 45* 30* 29*  CREATININE 2.35* 1.98* 1.81*  GLUCOSE 129* 186* 156*    Recent Labs Lab 05/15/16 0513 05/16/16 0548 05/17/16 0407  HGB 10.9* 10.7* 10.4*  HCT 34.0* 33.1* 32.2*  WBC 10.5 10.9* 9.8  PLT 251 220 209   Ct Chest Wo Contrast  05/16/2016  ADDENDUM REPORT: 05/16/2016 09:33 ADDENDUM: Study discussed by telephone with Dr. Doyle Askew on 05/16/2016 at 0926 hours. We discussed follow-up CT chest with IV contrast, and failing that - bronchoscopy. Electronically Signed   By: Genevie Ann M.D.   On: 05/16/2016 09:33  05/16/2016  CLINICAL DATA:  82 year old male with severe coronary artery disease, left ventricular systolic dysfunction with several week progressive symptoms of chest pain, shortness of Breath and combined systolic/diastolic congestive heart failure. Initial encounter. EXAM: CT CHEST WITHOUT CONTRAST TECHNIQUE: Multidetector CT imaging of the chest was performed following the standard protocol without IV contrast. COMPARISON:  Chest  radiographs 05/14/2016 and earlier. High-resolution Chest CT without contrast 06/08/2015. PET-CT 09/27/2015 FINDINGS: Chronic centrilobular emphysema with chronic bibasilar fibrotic changes. Superimposed moderate size layering left pleural effusion (layering to 2-2.5 cm from the lung base to the apex). Superimposed compressive atelectasis along the effusion. However, there is also increased left hilar soft tissue density which is exerting mass effect on the left lingula and lower lobe bronchi (series 3, image 93). This appears to be new since June 2016 and new or increased since September 2016. On soft tissue windows through this area some of the density appears to be hilar vasculature, however, there is definite significant increased in left prevascular and AP window lymph nodes now measuring 15-20 mm short axis individually (9 mm or less in June 2016). There is a superimposed 4 cm lobulated/spiculated opacity in the lingula (series 3, image 118) which is new from prior studies. There is associated adjacent left chest wall pleural thickening (series 2, image 124). Elsewhere there is increased curvilinear opacity along the right major fissure which most resembles atelectasis. There is chronic right apical scarring which is stable. Chronic right middle lobe calcified granuloma. No pericardial effusion. No right pleural effusion. Right peritracheal, right precarinal, and right hilar lymph nodes appear stable since 2016 (including occasional small right hilar calcified nodes. There is severe calcified atherosclerosis of the thoracic aorta. Severe coronary artery calcified plaque also noted. Negative thoracic inlet. No axillary lymphadenopathy. Stable visualized upper abdominal viscera including both adrenal glands. Chronic posterior left third through seventh rib fractures. No acute or suspicious osseous lesion identified. IMPRESSION: 1. Findings on this noncontrast exam which are highly suspicious of left lingula  malignancy with malignant left hilar and prevascular/AP window lymphadenopathy. A follow-up IV contrast enhanced CT chest would be most valuable at this point for further evaluation. 2. Moderate layering left pleural effusion. 3. Underlying chronic lung disease including emphysema and basilar pulmonary fibrosis. 4. Widespread/severe calcified atherosclerosis of the thoracic aorta and coronary arteries. Electronically Signed: By: Genevie Ann M.D. On: 05/16/2016 08:31    ASSESSMENT / PLAN: Lt hilar mass and lung nodules- of unclear etiology. At increased risk for primary malignancy given smoking history but could also be due to recent CAP, COPD exacerbation.  He can be re evaluated as an outpatient.  If he does need a biopsy then it would be high risk due to CAD, MI and the fact that he will be on antiplatelet agent after the planned cardiac stent.   COPD  NSTEMI On going cardiology eval for NSTEMI. He is likely to go to cath RCA intervention tomorrow.  Reccs: Would continue antibiotics and current  cardiology work up Can f/u as outpt with pulmonary with CT scan  He has follow up appt with Dr. Lamonte Sakai   PCCM will sign off. Please call back with any questions.  Marshell Garfinkel MD [facility] Pager [phone] If no answer or after 3pm call: [phone] 05/17/2016, 11:29 AM

## 2016-05-17 NOTE — Care Management Important Message (Signed)
Important Message  Patient Details  Name: Alan Ford MRN: [id number] Date of Birth: 18-Aug-1930   Medicare Important Message Given:  Yes    Lacretia Leigh, RN 05/17/2016, 9:35 AM

## 2016-05-17 NOTE — Progress Notes (Signed)
[name], male   DOB: April 17, 1930, 82 y.o.   MRN: [id number]    Subjective:  No complaints urinating well with hydration for PCI No CHF despite hydration and low EF He is aware of possible lung cancer  Objective:  Filed Vitals:   05/17/16 0200 05/17/16 0403 05/17/16 0800 05/17/16 0802  BP: 104/92 107/81 109/83 109/83  Pulse: 79 86 85 85  Temp:  98.3 F (36.8 C)  98.2 F (36.8 C)  TempSrc:  Oral  Oral  Resp: '13 15 30 15  '$ Height:      Weight:  81.149 kg (178 lb 14.4 oz)    SpO2: 91% 93% 86% 90%    Intake/Output from previous day:  Intake/Output Summary (Last 24 hours) at 05/17/16 1031 Last data filed at 05/17/16 1000  Gross per 24 hour  Intake 1346.65 ml  Output   1700 ml  Net -353.35 ml    Physical Exam: Affect appropriate Chronically ill white male  HEENT: normal Neck supple with no adenopathy JVP normal no bruits no thyromegaly Lungs decreased BS  no wheezing and good diaphragmatic motion Heart:  S1/S2 no murmur, no rub, gallop or click PMI normal Abdomen: benighn, BS positve, no tenderness, no AAA no bruit.  No HSM or HJR Distal pulses intact with no bruits No edema Neuro non-focal Skin warm and dry No muscular weakness   Lab Results: Basic Metabolic Panel:  Recent Labs  05/16/16 0548 05/17/16 0407  NA 141 138  K 4.0 3.8  CL 109 108  CO2 19* 19*  GLUCOSE 186* 156*  BUN 30* 29*  CREATININE 1.98* 1.81*  CALCIUM 8.5* 8.3*   Liver Function Tests:  Recent Labs  05/15/16 0513  AST 32  ALT 13*  ALKPHOS 56  BILITOT 0.9  PROT 6.0*  ALBUMIN 2.8*   CBC:  Recent Labs  05/16/16 0548 05/17/16 0407  WBC 10.9* 9.8  HGB 10.7* 10.4*  HCT 33.1* 32.2*  MCV 99.1 98.5  PLT 220 209   Cardiac Enzymes:  Recent Labs  05/14/16 1917 05/14/16 2042  TROPONINI 0.33* 0.37*   Hemoglobin A1C:  Recent Labs  05/14/16 1916  HGBA1C 7.5*    Imaging: Ct Chest Wo Contrast  05/16/2016  ADDENDUM REPORT: 05/16/2016 09:33 ADDENDUM: Study  discussed by telephone with Dr. Doyle Askew on 05/16/2016 at 0926 hours. We discussed follow-up CT chest with IV contrast, and failing that - bronchoscopy. Electronically Signed   By: Genevie Ann M.D.   On: 05/16/2016 09:33  05/16/2016  CLINICAL DATA:  82 year old male with severe coronary artery disease, left ventricular systolic dysfunction with several week progressive symptoms of chest pain, shortness of Breath and combined systolic/diastolic congestive heart failure. Initial encounter. EXAM: CT CHEST WITHOUT CONTRAST TECHNIQUE: Multidetector CT imaging of the chest was performed following the standard protocol without IV contrast. COMPARISON:  Chest radiographs 05/14/2016 and earlier. High-resolution Chest CT without contrast 06/08/2015. PET-CT 09/27/2015 FINDINGS: Chronic centrilobular emphysema with chronic bibasilar fibrotic changes. Superimposed moderate size layering left pleural effusion (layering to 2-2.5 cm from the lung base to the apex). Superimposed compressive atelectasis along the effusion. However, there is also increased left hilar soft tissue density which is exerting mass effect on the left lingula and lower lobe bronchi (series 3, image 93). This appears to be new since June 2016 and new or increased since September 2016. On soft tissue windows through this area some of the density appears to be hilar vasculature, however, there is definite significant increased in left  prevascular and AP window lymph nodes now measuring 15-20 mm short axis individually (9 mm or less in June 2016). There is a superimposed 4 cm lobulated/spiculated opacity in the lingula (series 3, image 118) which is new from prior studies. There is associated adjacent left chest wall pleural thickening (series 2, image 124). Elsewhere there is increased curvilinear opacity along the right major fissure which most resembles atelectasis. There is chronic right apical scarring which is stable. Chronic right middle lobe calcified  granuloma. No pericardial effusion. No right pleural effusion. Right peritracheal, right precarinal, and right hilar lymph nodes appear stable since 2016 (including occasional small right hilar calcified nodes. There is severe calcified atherosclerosis of the thoracic aorta. Severe coronary artery calcified plaque also noted. Negative thoracic inlet. No axillary lymphadenopathy. Stable visualized upper abdominal viscera including both adrenal glands. Chronic posterior left third through seventh rib fractures. No acute or suspicious osseous lesion identified. IMPRESSION: 1. Findings on this noncontrast exam which are highly suspicious of left lingula malignancy with malignant left hilar and prevascular/AP window lymphadenopathy. A follow-up IV contrast enhanced CT chest would be most valuable at this point for further evaluation. 2. Moderate layering left pleural effusion. 3. Underlying chronic lung disease including emphysema and basilar pulmonary fibrosis. 4. Widespread/severe calcified atherosclerosis of the thoracic aorta and coronary arteries. Electronically Signed: By: Genevie Ann M.D. On: 05/16/2016 08:31    Cardiac Studies:  ECG:  NSR RBBB lateral T wave changes    Telemetry:  NSR 05/17/2016   Echo: EF 30-35% reviewed   Medications:   . amLODipine  2.5 mg Oral Daily  . amoxicillin-clavulanate  1 tablet Oral Q12H  . aspirin EC  81 mg Oral QHS  . carvedilol  3.125 mg Oral BID WC  . [START ON 05/18/2016] clopidogrel  75 mg Oral Q breakfast  . guaiFENesin  600 mg Oral BID  . insulin aspart  0-5 Units Subcutaneous QHS  . insulin aspart  0-9 Units Subcutaneous TID WC  . isosorbide dinitrate  10 mg Oral BID  . simvastatin  20 mg Oral QHS  . sodium bicarbonate   Intravenous Pre-Cath   And  . sodium bicarbonate (CIN) infusion   Intravenous Pre-Cath  . sodium chloride flush  3 mL Intravenous Q12H  . sodium chloride flush  3 mL Intravenous Q12H  . sodium chloride flush  3 mL Intravenous Q12H  .  tamsulosin  0.4 mg Oral QHS     . sodium chloride 1 mL/kg/hr (05/17/16 0700)  . heparin 1,300 Units/hr (05/17/16 0700)    Assessment/Plan:  CHF:  New onset elevated BNP EF 30-35% by echo. Diuretics held for angio.  Continue Nitrates and beta blocker will consider diuretics post cath/PCI  CRF:  CR 1.8 with HCO3 and NS   likely related to MGUS should be ok for intervention today   CAD:  Reviewed cath and discussed at length with Dr Tamala Julian and Dr Roxy Manns. Culprit lesion is RCA.  More diffuse LAD disease and long bifurcation disease in OM.  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  If PFTls bad Really would not be a candidate for CABG.  Plan is to proceed with RCA intervention tomorrow. Possibly stage Off pump LIMA if PFTls not too bad.  Otherwise would Rx medically and see how he does after RCA intervention Pre PCI orders done HCO3 and hydration to continue. Patient in agreement and Dr Stan Head on board  Pulmonary:  Severe COPD previous heavy smoker.  3L [place] CPAP at  home continue antibiotics PFT;s pending Given likely diagnosis lung CA probably not a surgical candidate. Interesting that PET scan chest normal end  Of last year but will likely need another one have contacted Dr Lamonte Sakai for outpatient f/u and he has an oncologist For his [place] 05/17/2016, 10:31 AM

## 2016-05-17 NOTE — Progress Notes (Addendum)
OT Cancellation Note  Patient Details Name: Alan Ford MRN: [id number] DOB: May 07, 1930   Cancelled Treatment:    Reason Eval/Treat Not Completed: Other (comment).  Spoke to [employer].  Pt for cardiac cath today; will check back tomorrow after procedure.   Pt's sats  85-88% at rest.  [name] 05/17/2016, 7:55 AM  Lesle Chris, OTR/L [phone] 05/17/2016

## 2016-05-17 NOTE — Progress Notes (Signed)
Admit: 05/14/2016 LOS: 3  52M with CKD3-4, nephrotic proteinuria, admitted with NSTEMI and found to have severe 3V diseae and Lingular lung mass with LAN concerning for malignancy.  Subjective:  Seen by pulmonary  for PCI today SCr stable GOod UOP  No c/o today  05/17 0701 - 05/18 0700 In: 1700.7 [P.O.:720; I.V.:980.7] Out: 850 [Urine:850]  Filed Weights   05/15/16 0500 05/16/16 0347 05/17/16 0403  Weight: 80.5 kg (177 lb 7.5 oz) 80.513 kg (177 lb 8 oz) 81.149 kg (178 lb 14.4 oz)    Scheduled Meds: . amLODipine  2.5 mg Oral Daily  . amoxicillin-clavulanate  1 tablet Oral Q12H  . aspirin EC  81 mg Oral QHS  . carvedilol  3.125 mg Oral BID WC  . [START ON 05/18/2016] clopidogrel  75 mg Oral Q breakfast  . guaiFENesin  600 mg Oral BID  . insulin aspart  0-5 Units Subcutaneous QHS  . insulin aspart  0-9 Units Subcutaneous TID WC  . isosorbide dinitrate  10 mg Oral BID  . simvastatin  20 mg Oral QHS  . sodium bicarbonate   Intravenous Pre-Cath   And  . sodium bicarbonate (CIN) infusion   Intravenous Pre-Cath  . sodium chloride flush  3 mL Intravenous Q12H  . sodium chloride flush  3 mL Intravenous Q12H  . sodium chloride flush  3 mL Intravenous Q12H  . tamsulosin  0.4 mg Oral QHS   Continuous Infusions: . sodium chloride 1 mL/kg/hr (05/17/16 0700)  . heparin 1,300 Units/hr (05/17/16 0700)   PRN Meds:.sodium chloride, sodium chloride, acetaminophen, albuterol, HYDROcodone-acetaminophen, morphine injection, ondansetron (ZOFRAN) IV, oxyCODONE-acetaminophen, sodium chloride flush, sodium chloride flush  Current Labs: reviewed    Physical Exam:  Blood pressure 109/83, pulse 85, temperature 98.2 F (36.8 C), temperature source Oral, resp. rate 15, height '5\' 10"'$  (1.778 m), weight 81.149 kg (178 lb 14.4 oz), SpO2 90 %. GEN: NAD ENT: [place] in place EYES: EOMI CV: RRR PULM: diminished throughout ABD: s/nt/nd, no bruit SKIN: no rasehs/lesions EXT:no  edema  Assessment  1. CKD3-4 at baseline with nephrotic proteinuria (proteinuria = Diabetic disease or iMN) 2. NSTEMI, 3V CAD, RCA culprit; High Risk PCI vs CABG 3. Lingular Lung Mass with LAN: malignancy vs PNA 4. COPD 5. DM2 6. HTN 7. Anemia,mild 8. sCHF  Plan 1. Will follow closely following PCI today 2. No medication changes at current time 3. Daily weights, Daily Renal Panel, Strict I/Os, Avoid nephrotoxins (NSAIDs, judicious IV Contrast)    Pearson Grippe MD 05/17/2016, 10:25 AM   Recent Labs Lab 05/15/16 0513 05/16/16 0548 05/17/16 0407  NA 141 141 138  K 4.5 4.0 3.8  CL 109 109 108  CO2 20* 19* 19*  GLUCOSE 129* 186* 156*  BUN 45* 30* 29*  CREATININE 2.35* 1.98* 1.81*  CALCIUM 8.6* 8.5* 8.3*    Recent Labs Lab 05/14/16 1012  05/15/16 0513 05/16/16 0548 05/17/16 0407  WBC 9.2  < > 10.5 10.9* 9.8  NEUTROABS 7.4  --   --   --   --   HGB 11.6*  < > 10.9* 10.7* 10.4*  HCT 34.3*  < > 34.0* 33.1* 32.2*  MCV 96.9  < > 99.4 99.1 98.5  PLT 349.0  < > 251 220 209  < > = values in this interval not displayed.

## 2016-05-17 NOTE — Progress Notes (Signed)
ANTICOAGULATION CONSULT NOTE - Initial Consult  Pharmacy Consult for warfarin Indication: atrial fibrillation  No Known Allergies  Patient Measurements: Height: '5\' 10"'$  (177.8 cm) Weight: 178 lb 14.4 oz (81.149 kg) IBW/kg (Calculated) : 73  Vital Signs: Temp: 97.8 F (36.6 C) (05/18 1915) Temp Source: Oral (05/18 1915) BP: 166/66 mmHg (05/18 2148) Pulse Rate: 71 (05/18 1915)  Labs:  Recent Labs  05/15/16 0513  05/16/16 0548 05/16/16 1453 05/17/16 0407 05/17/16 1349 05/17/16 1510  HGB 10.9*  --  10.7*  --  10.4*  --   --   HCT 34.0*  --  33.1*  --  32.2*  --   --   PLT 251  --  220  --  209  --   --   LABPROT  --   --   --   --  14.2  --   --   INR  --   --   --   --  1.08  --   --   HEPARINUNFRC  --   < > 0.27* 0.28* 0.35 0.46  --   CREATININE 2.35*  --  1.98*  --  1.81*  --   --   TROPONINI  --   --   --   --   --   --  0.28*  < > = values in this interval not displayed.  Estimated Creatinine Clearance: 31.4 mL/min (by C-G formula based on Cr of 1.81).   Medical History: Past Medical History  Diagnosis Date  . Diabetes mellitus   . High blood pressure   . High cholesterol   . CHF (congestive heart failure) ([place])   . Emphysema   . Joint pain   . AAA (abdominal aortic aneurysm) ([place])   . COPD (chronic obstructive pulmonary disease) ([place])   . Chronic kidney disease   . Irregular heart beat   . PVC (premature ventricular contraction)   . Shingles   . Chronic renal disease, stage III   . Sleep apnea     cpap  . Irregular heart beat   . CAD (coronary artery disease)   . Chronic diastolic heart failure ([place]) 07/24/2011  . Atrial fibrillation ([place])   . Aortic calcification ([facility]) 05/16/2016    Involving ascending and descending thoracic aorta   Assessment: 89 yom s/p cath to start warfarin for afib. Baseline INR is WNL. H/H slightly low and platelets are WNL. No bleeding noted.   Goal of Therapy:  INR 2-3 Monitor platelets by anticoagulation protocol:  Yes   Plan:  - Warfarin 7.'5mg'$  PO x 1 tonight - Daily INR  Abubakr Wieman, Rande Lawman 05/17/2016,10:33 PM

## 2016-05-17 NOTE — Progress Notes (Addendum)
ANTICOAGULATION CONSULT NOTE - Follow-up Consult  Pharmacy Consult for heparin Indication: severe 3V CAD   No Known Allergies  Patient Measurements: Height: '5\' 10"'$  (177.8 cm) Weight: 178 lb 14.4 oz (81.149 kg) IBW/kg (Calculated) : 73 Heparin Dosing Weight: 80 kg  Vital Signs: Temp: 98.3 F (36.8 C) (05/18 0403) Temp Source: Oral (05/18 0403) BP: 107/81 mmHg (05/18 0403) Pulse Rate: 86 (05/18 0403)  Labs:  Recent Labs  05/14/16 1251 05/14/16 1917 05/14/16 2042 05/15/16 0513 05/16/16 0548 05/16/16 1453 05/17/16 0407  HGB 11.7*  --   --  10.9* 10.7*  --  10.4*  HCT 35.1*  --   --  34.0* 33.1*  --  32.2*  PLT 268  --   --  251 220  --  209  LABPROT 14.3  --   --   --   --   --  14.2  INR 1.10  --   --   --   --   --  1.08  HEPARINUNFRC  --   --   --   --  0.27* 0.28* 0.35  CREATININE 2.60*  --   --  2.35* 1.98*  --  1.81*  TROPONINI  --  0.33* 0.37*  --   --   --   --     Estimated Creatinine Clearance: 31.4 mL/min (by C-G formula based on Cr of 1.81).   Assessment: 82 y/o M on heparin for severe 3V CAD - awaiting CABG 5/18 but deemed not a candidate so likely PCI on 5/18. HL therapeutic this AM at 0.35. No bleeding noted, CBC stable.  Goal of Therapy:  HL 0.3 - 0.7 Monitor platelets by anticoagulation protocol: yes   Plan:  Continue heparin gtt at 1300 units/hr Daily HL and CBC F/u plans for PCI  Angela Burke, PharmD Pharmacy Resident Pager: [phone]  05/17/2016 7:25 AM _______________________________________________________ ADDENDUM:  Confirmatory HL 0.46 and pt therapeutic x 2 on 1300 units/hr. CBC stable. Pt to cath today. Will f/u [facility] plans.

## 2016-05-17 NOTE — Interval H&P Note (Signed)
Cath Lab Visit (complete for each Cath Lab visit)  Clinical Evaluation Leading to the Procedure:   ACS: Yes.    Non-ACS:    Anginal Classification: CCS III  Anti-ischemic medical therapy: Maximal Therapy (2 or more classes of medications)  Non-Invasive Test Results: No non-invasive testing performed  Prior CABG: No previous CABG      History and Physical Interval Note:  05/17/2016 2:37 PM  Alan Ford  has presented today for surgery, with the diagnosis of cad  The various methods of treatment have been discussed with the patient and family. After consideration of risks, benefits and other options for treatment, the patient has consented to  Procedure(s): Coronary/Graft Atherectomy (N/A) as a surgical intervention .  The patient's history has been reviewed, patient examined, no change in status, stable for surgery.  I have reviewed the patient's chart and labs.  Questions were answered to the patient's satisfaction.     Belva Crome III

## 2016-05-18 ENCOUNTER — Encounter (HOSPITAL_COMMUNITY): Payer: Self-pay | Admitting: Interventional Cardiology

## 2016-05-18 DIAGNOSIS — I700 Atherosclerosis of aorta: Secondary | ICD-10-CM

## 2016-05-18 DIAGNOSIS — I5020 Unspecified systolic (congestive) heart failure: Secondary | ICD-10-CM

## 2016-05-18 DIAGNOSIS — N183 Chronic kidney disease, stage 3 (moderate): Secondary | ICD-10-CM

## 2016-05-18 LAB — CBC
HEMATOCRIT: 29.5 % — AB (ref 39.0–52.0)
Hemoglobin: 9.5 g/dL — ABNORMAL LOW (ref 13.0–17.0)
MCH: 31.4 pg (ref 26.0–34.0)
MCHC: 32.2 g/dL (ref 30.0–36.0)
MCV: 97.4 fL (ref 78.0–100.0)
Platelets: 188 10*3/uL (ref 150–400)
RBC: 3.03 MIL/uL — ABNORMAL LOW (ref 4.22–5.81)
RDW: 14.1 % (ref 11.5–15.5)
WBC: 11 10*3/uL — AB (ref 4.0–10.5)

## 2016-05-18 LAB — BASIC METABOLIC PANEL
ANION GAP: 12 (ref 5–15)
Anion gap: 11 (ref 5–15)
BUN: 24 mg/dL — ABNORMAL HIGH (ref 6–20)
BUN: 24 mg/dL — AB (ref 6–20)
CALCIUM: 8.2 mg/dL — AB (ref 8.9–10.3)
CHLORIDE: 105 mmol/L (ref 101–111)
CO2: 22 mmol/L (ref 22–32)
CO2: 20 mmol/L — AB (ref 22–32)
CREATININE: 1.68 mg/dL — AB (ref 0.61–1.24)
Calcium: 7.9 mg/dL — ABNORMAL LOW (ref 8.9–10.3)
Chloride: 105 mmol/L (ref 101–111)
Creatinine, Ser: 1.77 mg/dL — ABNORMAL HIGH (ref 0.61–1.24)
GFR calc Af Amer: 39 mL/min — ABNORMAL LOW (ref >60)
GFR calc Af Amer: 41 mL/min — ABNORMAL LOW (ref >60)
GFR calc non Af Amer: 34 mL/min — ABNORMAL LOW (ref >60)
GFR calc non Af Amer: 36 mL/min — ABNORMAL LOW (ref >60)
Glucose, Bld: 217 mg/dL — ABNORMAL HIGH (ref 65–99)
Glucose, Bld: 242 mg/dL — ABNORMAL HIGH (ref 65–99)
POTASSIUM: 3.5 mmol/L (ref 3.5–5.1)
POTASSIUM: 3.6 mmol/L (ref 3.5–5.1)
SODIUM: 136 mmol/L (ref 135–145)
Sodium: 139 mmol/L (ref 135–145)

## 2016-05-18 LAB — GLUCOSE, CAPILLARY
GLUCOSE-CAPILLARY: 165 mg/dL — AB (ref 65–99)
Glucose-Capillary: 196 mg/dL — ABNORMAL HIGH (ref 65–99)
Glucose-Capillary: 198 mg/dL — ABNORMAL HIGH (ref 65–99)
Glucose-Capillary: 175 mg/dL — ABNORMAL HIGH (ref 65–99)

## 2016-05-18 LAB — TROPONIN I: TROPONIN I: 0.5 ng/mL — AB (ref <0.031)

## 2016-05-18 LAB — PROCALCITONIN: Procalcitonin: 0.11 ng/mL

## 2016-05-18 LAB — PHOSPHORUS: Phosphorus: 3.1 mg/dL (ref 2.5–4.6)

## 2016-05-18 LAB — PROTIME-INR
INR: 1.27 (ref 0.00–1.49)
PROTHROMBIN TIME: 16.1 s — AB (ref 11.6–15.2)

## 2016-05-18 LAB — POCT ACTIVATED CLOTTING TIME: ACTIVATED CLOTTING TIME: 162 s

## 2016-05-18 MED ORDER — HEPARIN (PORCINE) IN NACL 100-0.45 UNIT/ML-% IJ SOLN
1300.0000 [IU]/h | INTRAMUSCULAR | Status: DC
  Filled 2016-05-18: qty 250

## 2016-05-18 MED ORDER — ASPIRIN EC 81 MG PO TBEC: 81.0000 mg | DELAYED_RELEASE_TABLET | Freq: Every day | ORAL | Status: DC

## 2016-05-18 MED ORDER — FUROSEMIDE 20 MG PO TABS
10.0000 mg | ORAL_TABLET | Freq: Every day | ORAL | Status: DC
  Administered 2016-05-18 – 2016-05-19 (×2): 10 mg via ORAL
  Filled 2016-05-18 (×2): qty 1

## 2016-05-18 MED ORDER — HEPARIN SODIUM (PORCINE) 5000 UNIT/ML IJ SOLN
5000.0000 [IU] | Freq: Three times a day (TID) | INTRAMUSCULAR | Status: DC
  Administered 2016-05-18 – 2016-05-21 (×4): 5000 [IU] via SUBCUTANEOUS
  Filled 2016-05-18 (×5): qty 1

## 2016-05-18 MED FILL — Nitroglycerin IV Soln 200 MCG/ML in D5W: INTRAVENOUS | Qty: 250 | Status: AC

## 2016-05-18 NOTE — Care Management Note (Addendum)
Case Management Note  Patient Details  Name: Alan Ford MRN: [id number] Date of Birth: December 21, 1930  Subjective/Objective: Pt admitted for Nstemi. Plan for d/c home on 05-19-16. PT recommendations for HH PT. DME CPAP at home.                   Action/Plan: CM did speak with pt and wife, both agreeable to services. CM provided agency list and pt chose [facility].  Referral made to [facility] with [facility]. SOC to begin within 24-48 hours post d/c.  No further needs from CM at this time.   Expected Discharge Date:                  Expected Discharge Plan:  [place]  In-House Referral:  NA  Discharge planning Services  CM Consult  Post Acute Care Choice:  Home Health, Durable Medical Equipment.  Choice offered to:  Patient  DME Arranged:  Oxygen DME Agency:  [place] Arranged:  PT, RN [facility] Agency:  [place]  Status of Service:  Completed, signed off  Medicare Important Message Given:  Yes Date Medicare IM Given:    Medicare IM give by:    Date Additional Medicare IM Given:    Additional Medicare Important Message give by:     If discussed at [place] of Stay Meetings, dates discussed:    Additional Comments: 1151 05-22-16 Jacqlyn Krauss, RN,BSN [phone] Pt plan for d/c home today. Pt's Liter flow increased and [facility] is aware. Plan for [facility] services with choice of [facility]. SOC to begin within 24-48 hours post d/c. No further needs from CM at this time.   Bethena Roys, RN 05/18/2016, 4:11 PM

## 2016-05-18 NOTE — Progress Notes (Signed)
[name], male   DOB: [birth year], 82 y.o.   MRN: [id number] Clarification:  Patient does not have afib and was not on coumadin prior to admission Rhythm SR with PVC;s Especially given need for DAT recent stent and likely lung cancer will stop all orders for heparin and coumadin Discussed with Dr Tamala Julian and he does not need heparin for CAD  Jenkins Rouge

## 2016-05-18 NOTE — Progress Notes (Signed)
[facility]                                                                              Patient Demographics  Alan Ford, is a 82 y.o. male, DOB - 06-Dec-1930, ATF:[id number]  Admit date - 05/14/2016   Admitting Physician Alan Dickens, MD  Outpatient Primary MD for the patient is Alan Argyle, MD  Outpatient specialists:   LOS - 4  days    Chief Complaint  Patient presents with  . Abnormal ECG  . Chest Pain       Brief summary   82 y.o. male with known diabetes, HTN, HLD, CHF, emphysema/COPD, CKD, A. fib, OSA on CPAP, diastolic CHF, presenting with 3 days history of intermittent and substernal chest pain. In ED, patient found to have an NSTEMI. Cardiology was consulted and patient was immediately started on a heparin drip. Hospital course complicated by finding of new ? Hilar mass worrisome for malignancy. PCCM evaluated pt and recommended outpatient follow up with Alan Ford and that appointment has been scheduled.    Assessment & Plan   Chest Pain secondary to NSTEMI - s/p cathOn 5/16, showed severe three-vessel coronary artery disease, segmented 95% stenosis in mid RCA, 50-90% mid LAD, heavily calcified 85% circumflex stenosis EF 35% - Patient underwent rotational atherectomy and stenting of mid to distal RCA on 5/18 - Patient is enrolled in the [employer] free trial, antiplatelet therapy will be based according to the protocol. - Appreciate cardiology recommendation, Alan Ford, recommended to discontinue warfarin and heparin   ? New lingular mass, hilar mass - unclear etiology, at increased risk for primary malignancy given smoking history but could also be due to recent CAP, COPD exacerbation -Per CCM, Alan Ford, pt can be re evaluated as an outpatient, if he does need a biopsy then it would be high risk due to CAD, MI and the fact that he will be on antiplatelet agent after the planned cardiac stent - continue Augmentin day 5 - per PCCM, pt can f/u  as outpt with repeat CT scan, has follow up appt with Alan Ford on 7/17, will likely need to reschedule to earlier appointment  Acute systolic CHF new-onset - EF 30-35% by echo. 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 amoxicillin-clavulanate  1 tablet Oral Q12H  . aspirin  81 mg Oral Daily  . carvedilol  3.125 mg Oral BID WC  . clopidogrel  75 mg Oral Q breakfast  . furosemide  10 mg Oral Daily  . guaiFENesin  600 mg Oral BID  . insulin aspart  0-5 Units Subcutaneous QHS  . insulin aspart  0-9 Units Subcutaneous TID WC  . isosorbide dinitrate  10 mg Oral BID  . simvastatin  20 mg Oral QHS  . sodium chloride flush  3 mL Intravenous Q12H  . sodium chloride flush  3 mL Intravenous Q12H  . tamsulosin  0.4 mg Oral QHS   Continuous Infusions:  PRN Meds:.sodium chloride, acetaminophen, albuterol, ondansetron (ZOFRAN) IV, oxyCODONE-acetaminophen, sodium chloride flush   Antibiotics   Anti-infectives    Start     Dose/Rate Route Frequency Ordered Stop   05/14/16 1800  amoxicillin-clavulanate  (AUGMENTIN) 500-125 MG per tablet 500 mg     1 tablet Oral Every 12 hours 05/14/16 1726 05/19/16 1759   05/14/16 1730  amoxicillin-clavulanate (AUGMENTIN) 875-125 MG per tablet 1 tablet  Status:  Discontinued     1 tablet Oral Daily 05/14/16 1720 05/14/16 1725        Subjective:   Alan Ford was seen and examined today.  Sitting up in the chair, now complains. Patient denies dizziness, chest pain, shortness of breath, abdominal pain, N/V/D/C, new weakness, numbess, tingling. No acute events overnight.    Objective:   Filed Vitals:   05/18/16 0400 05/18/16 0723 05/18/16 0900 05/18/16 1100  BP: 122/67 149/74 126/66 137/72  Pulse: 89 87 95 88  Temp:  97.8 F (36.6 C)    TempSrc:  Oral    Resp: '10 17 22 19  '$ Height:      Weight:      SpO2: 95% 93% 94% 97%    Intake/Output Summary (Last 24 hours) at 05/18/16 1207 Last data filed at 05/18/16 0600  Gross per 24 hour  Intake  472.1 ml  Output   1625 ml  Net -1152.9 ml     Wt Readings from Last 3 Encounters:  05/18/16 83.008 kg (183 lb)  05/14/16 83.462 kg (184 lb)  05/09/16 84.369 kg (186 lb)     Exam  General: Alert and oriented x 3, NAD  HEENT:  PERRLA, EOMI, Anicteric Sclera, mucous membranes moist.   Neck: Supple, no JVD, no masses  Cardiovascular: S1 S2 auscultated, no rubs, murmurs or gallops. Regular rate and rhythm.  Respiratory: Clear to auscultation bilaterally, no wheezing, rales or rhonchi  Gastrointestinal: Soft, nontender, nondistended, + bowel sounds  Ext: no cyanosis clubbing or edema  Neuro: AAOx3, Cr N's II- XII. Strength 5/5 upper and lower extremities bilaterally  Skin: No rashes  Psych: Normal affect and demeanor, alert and oriented x3    Data Reviewed:  I have personally reviewed following labs and imaging studies  Micro Results Recent Results (from the past 240 hour(s))  MRSA PCR Screening     Status: None   Collection Time: 05/14/16  6:01 PM  Result Value Ref Range Status   MRSA  by PCR NEGATIVE NEGATIVE Final    Comment:        The GeneXpert MRSA Assay (FDA approved for NASAL specimens only), is one component of a comprehensive MRSA colonization surveillance program. It is not intended to diagnose MRSA infection nor to guide or monitor treatment for MRSA infections.  Culture, blood (routine x 2) Call MD if unable to obtain prior to antibiotics being given     Status: None (Preliminary result)   Collection Time: 05/14/16  6:54 PM  Result Value Ref Range Status   Specimen Description BLOOD RIGHT ANTECUBITAL  Final   Special Requests BOTTLES DRAWN AEROBIC AND ANAEROBIC 5CC  Final   Culture NO GROWTH 3 DAYS  Final   Report Status PENDING  Incomplete  Culture, blood (routine x 2) Call MD if unable to obtain prior to antibiotics being given     Status: None (Preliminary result)   Collection Time: 05/14/16  7:06 PM  Result Value Ref Range Status   Specimen Description BLOOD RIGHT HAND  Final   Special Requests IN PEDIATRIC BOTTLE 2CC  Final   Culture NO GROWTH 3 DAYS  Final   Report Status PENDING  Incomplete    Radiology Reports Dg Chest 2 View  05/14/2016  CLINICAL DATA:  Shortness of breath, cough/ congestion, substernal chest pain x3 weeks EXAM: CHEST  2 VIEW COMPARISON:  05/09/2016 FINDINGS: Chronic interstitial markings with subpleural reticulation/fibrosis, particularly in the right mid lung and left lower lobe. Appearance is similar when compared to 2015, although superimposed bibasilar infection/pneumonia is not entirely excluded. Trace left pleural effusion. No pneumothorax. The heart is normal in size. Degenerative changes of the visualized thoracolumbar spine. IMPRESSION: Chronic interstitial lung disease. Superimposed mild bibasilar infection/pneumonia is possible. Trace left pleural effusion. Electronically Signed   By: Julian Hy M.D.   On: 05/14/2016 10:27   Dg Chest 2 View  05/09/2016  CLINICAL DATA:  Chest tightness and congestion with  productive cough. Increase shortness of breath and wheezing over the past 2 weeks. History of COPD, interstitial lung disease, and CHF. Discontinued smoking 30 years ago. EXAM: CHEST  2 VIEW COMPARISON:  PA and lateral chest x-ray of June 14, 2014 and CT scan of the chest of June 08, 2015. FINDINGS: The lungs remain hyperinflated. The interstitial markings remain increased bilaterally. These are slightly more conspicuous in the left lower lobe today. The heart is top-normal in size. The pulmonary vascularity is normal. There is no pleural effusion. There is mild tortuosity of the descending thoracic aorta. There is mural calcification throughout the thoracic aorta. There is an old healed midshaft left clavicular fracture. The thoracic vertebral bodies are preserved in height. IMPRESSION: Chronic interstitial changes with superimposed interstitial infiltrate in the left lower lobe. There is no alveolar pneumonia. Followup PA and lateral chest X-ray is recommended in 3-4 weeks following trial of antibiotic therapy to ensure resolution and exclude underlying malignancy. Electronically Signed   By: David  Martinique M.D.   On: 05/09/2016 13:22   Ct Chest Wo Contrast  05/16/2016  ADDENDUM REPORT: 05/16/2016 09:33 ADDENDUM: Study discussed by telephone with Alan. Doyle Askew on 05/16/2016 at 0926 hours. We discussed follow-up CT chest with IV contrast, and failing that - bronchoscopy. Electronically Signed   By: Genevie Ann M.D.   On: 05/16/2016 09:33  05/16/2016  CLINICAL DATA:  82 year old male with severe coronary artery disease, left ventricular systolic dysfunction with several week progressive symptoms of chest pain, shortness of Breath and combined systolic/diastolic congestive heart failure. Initial encounter. EXAM: CT CHEST WITHOUT CONTRAST TECHNIQUE: Multidetector CT imaging of the chest was performed following the standard protocol without IV contrast. COMPARISON:  Chest radiographs 05/14/2016 and earlier. High-resolution  Chest CT without contrast 06/08/2015. PET-CT 09/27/2015 FINDINGS: Chronic centrilobular emphysema with chronic bibasilar fibrotic changes. Superimposed moderate size layering left pleural  effusion (layering to 2-2.5 cm from the lung base to the apex). Superimposed compressive atelectasis along the effusion. However, there is also increased left hilar soft tissue density which is exerting mass effect on the left lingula and lower lobe bronchi (series 3, image 93). This appears to be new since June 2016 and new or increased since September 2016. On soft tissue windows through this area some of the density appears to be hilar vasculature, however, there is definite significant increased in left prevascular and AP window lymph nodes now measuring 15-20 mm short axis individually (9 mm or less in June 2016). There is a superimposed 4 cm lobulated/spiculated opacity in the lingula (series 3, image 118) which is new from prior studies. There is associated adjacent left chest wall pleural thickening (series 2, image 124). Elsewhere there is increased curvilinear opacity along the right major fissure which most resembles atelectasis. There is chronic right apical scarring which is stable. Chronic right middle lobe calcified granuloma. No pericardial effusion. No right pleural effusion. Right peritracheal, right precarinal, and right hilar lymph nodes appear stable since 2016 (including occasional small right hilar calcified nodes. There is severe calcified atherosclerosis of the thoracic aorta. Severe coronary artery calcified plaque also noted. Negative thoracic inlet. No axillary lymphadenopathy. Stable visualized upper abdominal viscera including both adrenal glands. Chronic posterior left third through seventh rib fractures. No acute or suspicious osseous lesion identified. IMPRESSION: 1. Findings on this noncontrast exam which are highly suspicious of left lingula malignancy with malignant left hilar and prevascular/AP  window lymphadenopathy. A follow-up IV contrast enhanced CT chest would be most valuable at this point for further evaluation. 2. Moderate layering left pleural effusion. 3. Underlying chronic lung disease including emphysema and basilar pulmonary fibrosis. 4. Widespread/severe calcified atherosclerosis of the thoracic aorta and coronary arteries. Electronically Signed: By: Genevie Ann M.D. On: 05/16/2016 08:31    Lab Data:  CBC:  Recent Labs Lab 05/14/16 1012 05/14/16 1251 05/15/16 0513 05/16/16 0548 05/17/16 0407 05/18/16 0348  WBC 9.2 10.3 10.5 10.9* 9.8 11.0*  NEUTROABS 7.4  --   --   --   --   --   HGB 11.6* 11.7* 10.9* 10.7* 10.4* 9.5*  HCT 34.3* 35.1* 34.0* 33.1* 32.2* 29.5*  MCV 96.9 97.8 99.4 99.1 98.5 97.4  PLT 349.0 268 251 220 209 748   Basic Metabolic Panel:  Recent Labs Lab 05/15/16 0513 05/16/16 0548 05/17/16 0407 05/17/16 2330 05/18/16 0348  NA 141 141 138 136 139  K 4.5 4.0 3.8 3.6 3.5  CL 109 109 108 105 105  CO2 20* 19* 19* 20* 22  GLUCOSE 129* 186* 156* 242* 217*  BUN 45* 30* 29* 24* 24*  CREATININE 2.35* 1.98* 1.81* 1.68* 1.77*  CALCIUM 8.6* 8.5* 8.3* 7.9* 8.2*  PHOS  --   --   --  3.1  --    GFR: Estimated Creatinine Clearance: 32.1 mL/min (by C-G formula based on Cr of 1.77). Liver Function Tests:  Recent Labs Lab 05/15/16 0513  AST 32  ALT 13*  ALKPHOS 56  BILITOT 0.9  PROT 6.0*  ALBUMIN 2.8*   No results for input(s): LIPASE, AMYLASE in the last 168 hours. No results for input(s): AMMONIA in the last 168 hours. Coagulation Profile:  Recent Labs Lab 05/14/16 1251 05/17/16 0407 05/18/16 0340  INR 1.10 1.08 1.27   Cardiac Enzymes:  Recent Labs Lab 05/14/16 1917 05/14/16 2042 05/17/16 1510  TROPONINI 0.33* 0.37* 0.28*   BNP (last 3 results)  Recent Labs  05/14/16 1012  PROBNP 1454.0*   HbA1C: No results for input(s): HGBA1C in the last 72 hours. CBG:  Recent Labs Lab 05/17/16 0756 05/17/16 1125 05/17/16 1757  05/17/16 2131 05/18/16 0721  GLUCAP 174* 140* 169* 212* 196*   Lipid Profile: No results for input(s): CHOL, HDL, LDLCALC, TRIG, CHOLHDL, LDLDIRECT in the last 72 hours. Thyroid Function Tests: No results for input(s): TSH, T4TOTAL, FREET4, T3FREE, THYROIDAB in the last 72 hours. Anemia Panel: No results for input(s): VITAMINB12, FOLATE, FERRITIN, TIBC, IRON, RETICCTPCT in the last 72 hours. Urine analysis:    Component Value Date/Time   COLORURINE STRAW* 05/15/2016 1927   APPEARANCEUR CLEAR 05/15/2016 1927   LABSPEC 1.016 05/15/2016 1927   PHURINE 6.5 05/15/2016 1927   GLUCOSEU 250* 05/15/2016 1927   HGBUR SMALL* 05/15/2016 1927   BILIRUBINUR NEGATIVE 05/15/2016 [place] NEGATIVE 05/15/2016 1927   PROTEINUR 100* 05/15/2016 1927   UROBILINOGEN 0.2 10/06/2009 0923   NITRITE NEGATIVE 05/15/2016 1927   LEUKOCYTESUR NEGATIVE 05/15/2016 1927     RAI,RIPUDEEP M.D. [facility] 05/18/2016, 12:07 PM  Pager: [phone] Between 7am to 7pm - call Pager - [phone]  After 7pm go to [email or web address] - password [email or web address]  Call night coverage person covering after 7pm

## 2016-05-18 NOTE — Progress Notes (Signed)
Physical Therapy Treatment Patient Details Name: Alan Ford MRN: [id number] DOB: 02/14/1930 Today's Date: 05/18/2016    History of Present Illness 82 y.o. male with known diabetes, HTN, HLD, CHF, emphysema/COPD, CKD, A. fib, OSA on CPAP, diastolic CHF, presenting with 3 days history of intermittent and substernal chest pain.Pt with NSTEMI, underwent cath 5/16.     PT Comments    Alan Ford demonstrates instability w/ level surface ambulation and high level balance activities while ambulating.  Additionally, pt's SpO2 dropped to 79% on 3L O2 and required ~4 minutes standing rest break w/ pursed lip breathing for SpO2 to increase to 91%.  Updated follow up recommendations to HHPT given pt's balance impairments.   Follow Up Recommendations  Home health PT;Supervision for mobility/OOB     Equipment Recommendations  None recommended by PT    Recommendations for Other Services       Precautions / Restrictions Precautions Precautions: Other (comment) Precaution Comments: monitor O2 Restrictions Weight Bearing Restrictions: No    Mobility  Bed Mobility Overal bed mobility: Modified Independent             General bed mobility comments: Increased time due to lines, no cues or physical assist needed.  Transfers Overall transfer level: Needs assistance Equipment used: None Transfers: Sit to/from Stand Sit to Stand: Supervision         General transfer comment: No cues or physical assist needed.  Pt steady w/ transfer.  Ambulation/Gait Ambulation/Gait assistance: Min guard Ambulation Distance (Feet): 300 Feet Assistive device: None Gait Pattern/deviations: Step-through pattern;Decreased stride length;Staggering left;Staggering right   Gait velocity interpretation: at or above normal speed for age/gender General Gait Details: Pt staggers slightly Lt/Rt occasionally but is able to steady w/o physical assist.  SpO2 drops to 79% on 3L O2 and pt requires ~4 minute standing  rest break for SpO2 to increase to 91%.     Stairs            Wheelchair Mobility    Modified Rankin (Stroke Patients Only)       Balance Overall balance assessment: Needs assistance Sitting-balance support: No upper extremity supported;Feet supported Sitting balance-Leahy Scale: Normal     Standing balance support: No upper extremity supported;During functional activity Standing balance-Leahy Scale: Fair                      Cognition Arousal/Alertness: Awake/alert Behavior During Therapy: WFL for tasks assessed/performed Overall Cognitive Status: Within Functional Limits for tasks assessed                      Exercises Other Exercises Other Exercises: Pursed lip breathing.  Demonstrated to pt and cues throughout session.    General Comments General comments (skin integrity, edema, etc.): Discussed energy saving techniques including home layout and pursed lip breathing.      Pertinent Vitals/Pain Pain Assessment: No/denies pain    Home Living                      Prior Function            PT Goals (current goals can now be found in the care plan section) Acute Rehab PT Goals Patient Stated Goal: return home PT Goal Formulation: With patient Time For Goal Achievement: 05/30/16 Potential to Achieve Goals: Good Progress towards PT goals: Progressing toward goals    Frequency  Min 3X/week    PT Plan Discharge plan needs to be updated  Co-evaluation             End of Session Equipment Utilized During Treatment: Gait belt;Oxygen Activity Tolerance: Treatment limited secondary to medical complications (Comment) (hypoxia) Patient left: in chair;with call bell/phone within reach;with chair alarm set;with family/visitor present     Time: 4917-9150 (pt not charged for 10 minutes spent for pericare) PT Time Calculation (min) (ACUTE ONLY): 38 min  Charges:  $Gait Training: 23-37 mins                    G Codes:       Collie Siad PT, DPT  Pager: [phone] Phone: [phone] 05/18/2016, 4:07 PM

## 2016-05-18 NOTE — Progress Notes (Signed)
CRITICAL VALUE ALERT  Critical value received:  Troponin 0.50  Date of notification:  05/18/16  Time of notification:  4599  Critical value read back:Yes.    Nurse who received alert:  Donnella Bi, RN  MD notified (1st page):  Rai  Time of first page:  1830  Responding MD:  Rai  Time MD responded:  [phone]  Will continue to monitor.

## 2016-05-18 NOTE — Progress Notes (Signed)
Patient have frequent ectopy. Notified Dr. Marigene Ehlers of runs. Labs ordered. Will continue to monitor patient. Gildardo Cranker, RN

## 2016-05-18 NOTE — Progress Notes (Signed)
Admit: 05/14/2016 LOS: 4  47M with CKD3-4, nephrotic proteinuria, admitted with NSTEMI and found to have severe 3V diseae and Lingular lung mass with LAN concerning for malignancy.  Subjective:  PCI of RCA yesterday, 57m contrast used Good UOP yesterday Labs stable this AM   05/18 0701 - 05/19 0700 In: 939.6 [I.V.:939.6] Out: 2625 [Urine:2625]  Filed Weights   05/16/16 0347 05/17/16 0403 05/18/16 0342  Weight: 80.513 kg (177 lb 8 oz) 81.149 kg (178 lb 14.4 oz) 83.008 kg (183 lb)    Scheduled Meds: . amoxicillin-clavulanate  1 tablet Oral Q12H  . aspirin  81 mg Oral Daily  . carvedilol  3.125 mg Oral BID WC  . clopidogrel  75 mg Oral Q breakfast  . furosemide  10 mg Oral Daily  . guaiFENesin  600 mg Oral BID  . insulin aspart  0-5 Units Subcutaneous QHS  . insulin aspart  0-9 Units Subcutaneous TID WC  . isosorbide dinitrate  10 mg Oral BID  . simvastatin  20 mg Oral QHS  . sodium chloride flush  3 mL Intravenous Q12H  . sodium chloride flush  3 mL Intravenous Q12H  . tamsulosin  0.4 mg Oral QHS   Continuous Infusions:   PRN Meds:.sodium chloride, acetaminophen, albuterol, ondansetron (ZOFRAN) IV, oxyCODONE-acetaminophen, sodium chloride flush  Current Labs: reviewed    Physical Exam:  Blood pressure 149/74, pulse 87, temperature 97.8 F (36.6 C), temperature source Oral, resp. rate 17, height '5\' 10"'$  (1.778 m), weight 83.008 kg (183 lb), SpO2 93 %. GEN: NAD ENT: [place] in place EYES: EOMI CV: RRR PULM: diminished throughout ABD: s/nt/nd, no bruit SKIN: no rasehs/lesions EXT:no edema  Assessment  1. CKD3-4 at baseline with nephrotic proteinuria (proteinuria = Diabetic disease or iMN) 2. NSTEMI, 3V CAD, RCA culprit; s/p PCI 5/18 3. Lingular Lung Mass with LAN: malignancy vs recent PNA 4. COPD 5. DM2 6. HTN 7. Anemia,mild 8. sCHF  Plan 1. Will follow closely following PCI today -- still in window for CIN 2. No medication changes at current time 3. Daily  weights, Daily Renal Panel, Strict I/Os, Avoid nephrotoxins (NSAIDs, judicious IV Contrast)    RPearson GrippeMD 05/18/2016, 9:58 AM   Recent Labs Lab 05/17/16 0407 05/17/16 2330 05/18/16 0348  NA 138 136 139  K 3.8 3.6 3.5  CL 108 105 105  CO2 19* 20* 22  GLUCOSE 156* 242* 217*  BUN 29* 24* 24*  CREATININE 1.81* 1.68* 1.77*  CALCIUM 8.3* 7.9* 8.2*  PHOS  --  3.1  --     Recent Labs Lab 05/14/16 1012  05/16/16 0548 05/17/16 0407 05/18/16 0348  WBC 9.2  < > 10.9* 9.8 11.0*  NEUTROABS 7.4  --   --   --   --   HGB 11.6*  < > 10.7* 10.4* 9.5*  HCT 34.3*  < > 33.1* 32.2* 29.5*  MCV 96.9  < > 99.1 98.5 97.4  PLT 349.0  < > 220 209 188  < > = values in this interval not displayed.

## 2016-05-18 NOTE — Progress Notes (Addendum)
Notified on call Cards fellow of frequent ectopy. VS stable, patient not in distress. Labs appear within normal limits. Will continue to monitor. Gildardo Cranker, RN

## 2016-05-18 NOTE — Progress Notes (Signed)
Notified on Call Cardiology fellow of potassium 3.5  This morning. Will continue to monitor. Gildardo Cranker, RN

## 2016-05-18 NOTE — Progress Notes (Addendum)
[name], male   DOB: 05/10/1930, 82 y.o.   MRN: [id number]    Subjective:  No chest pain wants foley in a bit longer   Objective:  Filed Vitals:   05/18/16 0245 05/18/16 0342 05/18/16 0400 05/18/16 0723  BP: 138/65  122/67 149/74  Pulse: 91  89 87  Temp:  97.6 F (36.4 C)  97.8 F (36.6 C)  TempSrc:  Oral  Oral  Resp: '13  10 17  '$ Height:      Weight:  83.008 kg (183 lb)    SpO2: 95%  95% 93%    Intake/Output from previous day:  Intake/Output Summary (Last 24 hours) at 05/18/16 0739 Last data filed at 05/18/16 0600  Gross per 24 hour  Intake  939.6 ml  Output   2625 ml  Net -1685.4 ml    Physical Exam: Affect appropriate Chronically ill white male  HEENT: normal Neck supple with no adenopathy JVP normal no bruits no thyromegaly Lungs decreased BS  no wheezing and good diaphragmatic motion Heart:  S1/S2 no murmur, no rub, gallop or click PMI normal Abdomen: benighn, BS positve, no tenderness, no AAA no bruit.  No HSM or HJR Distal pulses intact with no bruits No edema Neuro non-focal Skin warm and dry No muscular weakness RFA A no hematoma    Lab Results: Basic Metabolic Panel:  Recent Labs  05/17/16 2330 05/18/16 0348  NA 136 139  K 3.6 3.5  CL 105 105  CO2 20* 22  GLUCOSE 242* 217*  BUN 24* 24*  CREATININE 1.68* 1.77*  CALCIUM 7.9* 8.2*  PHOS 3.1  --    CBC:  Recent Labs  05/17/16 0407 05/18/16 0348  WBC 9.8 11.0*  HGB 10.4* 9.5*  HCT 32.2* 29.5*  MCV 98.5 97.4  PLT 209 188   Cardiac Enzymes:  Recent Labs  05/17/16 1510  TROPONINI 0.28*    Imaging: No results found.  Cardiac Studies:  ECG:  NSR RBBB lateral T wave changes    Telemetry:  NSR  RBBB nonspecific ST changes PVC;s 05/18/2016   Echo: EF 30-35% reviewed   Medications:   . amLODipine  2.5 mg Oral Daily  . amoxicillin-clavulanate  1 tablet Oral Q12H  . aspirin  81 mg Oral Daily  . aspirin EC  81 mg Oral QHS  . carvedilol  3.125 mg Oral BID WC    . clopidogrel  75 mg Oral Q breakfast  . guaiFENesin  600 mg Oral BID  . insulin aspart  0-5 Units Subcutaneous QHS  . insulin aspart  0-9 Units Subcutaneous TID WC  . isosorbide dinitrate  10 mg Oral BID  . simvastatin  20 mg Oral QHS  . sodium chloride flush  3 mL Intravenous Q12H  . sodium chloride flush  3 mL Intravenous Q12H  . tamsulosin  0.4 mg Oral QHS  . Warfarin - Pharmacist Dosing Inpatient   Does not apply q1800     . heparin      Assessment/Plan:  CHF:  New onset elevated BNP EF 30-35% by echo. Start low dose laisix now that cath/PCI done and Cr stable Rx nitrates and hydralazine no ACE   CRF:  CR 1.77 with HCO3 and NS   likely related to MGUS    CAD:  Post rotoblator of RCA Residual complex circumflex and LAD disease Plan medical Rx due to comorbidities Consider off pump LIMA in future but issues regarding COPD and lung cancer with porcelin aorta limits  options See consult note by Dr Roxy Manns  Pulmonary:  Severe COPD previous heavy smoker.  3L [place] CPAP at home continue antibiotics PFT;s pending Given likely diagnosis lung CA probably not a surgical candidate. Interesting that PET scan chest normal end  Of last year but will likely need another one have contacted Dr Lamonte Sakai for outpatient f/u and he has an oncologist For his MGUS  Not clear why coumadin started ???? Has SR with PACls not afib will stop and start baby aspirin with plavix for severe CAD not totally revascularized   LIkely d/c over weekend by primary service    [name] 05/18/2016, 7:39 AM

## 2016-05-18 NOTE — Progress Notes (Signed)
Patient continuing to have multiple 10-15 beat runs of multifocal PVCs. On call Cardiology fellow has been notified. Will continue to monitor patient. Gildardo Cranker, RN

## 2016-05-18 NOTE — Progress Notes (Signed)
Pt to resume heparin 8hr after sheath removed; RN reports sheath removed at MN.  Will resume at prior therapeutic rate of 1300 units/hr and monitor heparin levels and CBC.  Wynona Neat, PharmD, BCPS 05/18/2016 6:00 AM

## 2016-05-18 NOTE — Progress Notes (Signed)
CARDIAC REHAB PHASE I   PRE:  Rate/Rhythm: 94 SR c/ PVCs  BP:  Sitting: 129/71        SaO2: 95 4L  MODE:  Ambulation: 350 ft   POST:  Rate/Rhythm: 111 ST c/ PVCs  BP:  Sitting: 147/77         SaO2: 89 4L, 94 on 2L after 2 minutes rest  Pt ambulated 350 ft on 4L O2, foley, rolling walker, assist x1, unsteady gait at times, tolerated fairly well.  Pt has significant DOE (possibly his baseline), denies cp, dizziness, declined rest stop. Pt did seem less steady upon returning to room, (was more fatigued), rushing to return to recliner, almost tripped on cords (poor safety awareness). Would recommend use of gait belt in the future for safety. Completed MI/stent/CHF education.  Reviewed risk factors, MI book, anti-platelet therapy, stent card, activity restrictions, ntg, exercise, heart healthy diet, carb counting, portion control, sodium restrictions, daily weights, CHF booklet and zone tool and phase 2 cardiac rehab. Pt verbalized understanding, receptive to education, although states he is not willing to make certain diet changes at this time. Pt agrees to phase 2 cardiac rehab referral, will send to [facility] per pt request. Pt to bed after walk for education, then to chair, call bell within reach. Will follow.   [place], RN, BSN 05/18/2016 12:16 PM

## 2016-05-18 NOTE — Evaluation (Signed)
Occupational Therapy Evaluation Patient Details Name: Alan Ford MRN: [id number] DOB: 1930-12-09 Today's Date: 05/18/2016    History of Present Illness 82 y.o. male with known diabetes, HTN, HLD, CHF, emphysema/COPD, CKD, A. fib, OSA on CPAP, diastolic CHF, presenting with 3 days history of intermittent and substernal chest pain.Pt with NSTEMI, underwent cath 5/16.    Clinical Impression   Pt admitted with above. He demonstrates the below listed deficits and will benefit from continued OT to maximize safety and independence with BADLs.  Eval limited due to pt fatigue.  Reviewed energy conservation with pt and options for shower seats.  At end of session, pt with complaint of chest pain/pressure.  RN notified.  Pt left supine.  Will follow acutely.       Follow Up Recommendations  Home health OT;Supervision/Assistance - 24 hour    Equipment Recommendations  3 in 1 bedside comode    Recommendations for Other Services       Precautions / Restrictions Precautions Precautions: Other (comment) Precaution Comments: monitor O2 Restrictions Weight Bearing Restrictions: No      Mobility Bed Mobility Overal bed mobility: Modified Independent             General bed mobility comments: Increased time due to lines, no cues or physical assist needed.  Transfers Overall transfer level: Needs assistance Equipment used: None Transfers: Sit to/from Stand Sit to Stand: Supervision         General transfer comment: No cues or physical assist needed.  Pt steady w/ transfer.    Balance Overall balance assessment: Needs assistance Sitting-balance support: No upper extremity supported;Feet supported Sitting balance-Leahy Scale: Normal     Standing balance support: No upper extremity supported;During functional activity Standing balance-Leahy Scale: Fair                   Standardized Balance Assessment Standardized Balance Assessment : Dynamic Gait Index   Dynamic  Gait Index Level Surface: Mild Impairment Change in Gait Speed: Normal Gait with Horizontal Head Turns: Mild Impairment Gait with Vertical Head Turns: Mild Impairment Gait and Pivot Turn: Mild Impairment Step Over Obstacle: Moderate Impairment      ADL Overall ADL's : Needs assistance/impaired                                       General ADL Comments: Pt in bed and too fatigued to get up with OT.  Anticipate he will require min A - mod A  for ADLs.       Vision     Perception     Praxis      Pertinent Vitals/Pain Pain Assessment: 0-10 Pain Score: 5  Pain Location: chest Pain Descriptors / Indicators: Aching;Pressure Pain Intervention(s): Other (comment) (RN notified )     Hand Dominance Right   Extremity/Trunk Assessment Upper Extremity Assessment Upper Extremity Assessment: Generalized weakness   Lower Extremity Assessment Lower Extremity Assessment: Defer to PT evaluation       Communication Communication Communication: No difficulties   Cognition Arousal/Alertness: Lethargic Behavior During Therapy: WFL for tasks assessed/performed Overall Cognitive Status: Within Functional Limits for tasks assessed                     General Comments       Exercises Exercises: Other exercises Other Exercises Other Exercises: Pursed lip breathing.  Demonstrated to pt and cues throughout session.  Shoulder Instructions      Home Living Family/patient expects to be discharged to:: Private residence Living Arrangements: Spouse/significant other Available Help at Discharge: Family;Available 24 hours/day Type of Home: House Home Access: Level entry     Home Layout: One level     Bathroom Shower/Tub: Occupational psychologist: Handicapped height     Home Equipment: None   Additional Comments: pt on 3 L O2 continuous at home      Prior Functioning/Environment Level of Independence: Independent             OT  Diagnosis: Generalized weakness   OT Problem List: Decreased strength;Decreased activity tolerance;Decreased knowledge of use of DME or AE;Cardiopulmonary status limiting activity;Pain   OT Treatment/Interventions: Self-care/ADL training;Therapeutic exercise;DME and/or AE instruction;Therapeutic activities;Patient/family education;Energy conservation    OT Goals(Current goals can be found in the care plan section) Acute Rehab OT Goals Patient Stated Goal: return home OT Goal Formulation: With patient Time For Goal Achievement: 06/01/16 Potential to Achieve Goals: Good ADL Goals Pt Will Perform Grooming: with supervision Pt Will Perform Upper Body Bathing: with supervision;sitting Pt Will Perform Lower Body Bathing: with supervision;sit to/from stand Pt Will Perform Upper Body Dressing: with supervision;sitting Pt Will Perform Lower Body Dressing: with supervision;sit to/from stand Pt Will Transfer to Toilet: with supervision;ambulating;regular height toilet;bedside commode;grab bars Pt Will Perform Toileting - Clothing Manipulation and hygiene: with supervision;sit to/from stand Pt Will Perform Tub/Shower Transfer: Shower transfer;ambulating;shower seat;rolling walker Additional ADL Goal #1: Pt will be independent with energy conservation techniques  Additional ADL Goal #2: Pt will be able to actively tolerate 25 mins therapeutic activities with no more than 2 rest breaks to increase endurance for ADLs   OT Frequency: Min 2X/week   Barriers to D/C:            Co-evaluation              End of Session Equipment Utilized During Treatment: Oxygen Nurse Communication: Other (comment) (chest pain )  Activity Tolerance: Patient limited by fatigue Patient left: in bed;with call bell/phone within reach   Time: 0093-8182 OT Time Calculation (min): 19 min Charges:  OT General Charges $OT Visit: 1 Procedure OT Evaluation $OT Eval Moderate Complexity: 1 Procedure G-Codes:     Adrain Nesbit M May 19, 2016, 5:35 PM

## 2016-05-19 ENCOUNTER — Inpatient Hospital Stay (HOSPITAL_COMMUNITY): Payer: Medicare Other

## 2016-05-19 DIAGNOSIS — J410 Simple chronic bronchitis: Secondary | ICD-10-CM

## 2016-05-19 DIAGNOSIS — E118 Type 2 diabetes mellitus with unspecified complications: Secondary | ICD-10-CM

## 2016-05-19 LAB — CBC
HCT: 29.1 % — ABNORMAL LOW (ref 39.0–52.0)
Hemoglobin: 9.4 g/dL — ABNORMAL LOW (ref 13.0–17.0)
MCH: 31.6 pg (ref 26.0–34.0)
MCHC: 32.3 g/dL (ref 30.0–36.0)
MCV: 98 fL (ref 78.0–100.0)
Platelets: 196 K/uL (ref 150–400)
RBC: 2.97 MIL/uL — ABNORMAL LOW (ref 4.22–5.81)
RDW: 14.3 % (ref 11.5–15.5)
WBC: 8.8 K/uL (ref 4.0–10.5)

## 2016-05-19 LAB — CULTURE, BLOOD (ROUTINE X 2)
CULTURE: NO GROWTH
Culture: NO GROWTH

## 2016-05-19 LAB — PROTIME-INR
INR: 1.28 (ref 0.00–1.49)
PROTHROMBIN TIME: 16.1 s — AB (ref 11.6–15.2)

## 2016-05-19 LAB — BASIC METABOLIC PANEL
ANION GAP: 14 (ref 5–15)
BUN: 20 mg/dL (ref 6–20)
CO2: 22 mmol/L (ref 22–32)
Calcium: 8.4 mg/dL — ABNORMAL LOW (ref 8.9–10.3)
Chloride: 104 mmol/L (ref 101–111)
Creatinine, Ser: 1.74 mg/dL — ABNORMAL HIGH (ref 0.61–1.24)
GFR, EST AFRICAN AMERICAN: 40 mL/min — AB (ref >60)
GFR, EST NON AFRICAN AMERICAN: 34 mL/min — AB (ref >60)
Glucose, Bld: 165 mg/dL — ABNORMAL HIGH (ref 65–99)
POTASSIUM: 3.8 mmol/L (ref 3.5–5.1)
SODIUM: 140 mmol/L (ref 135–145)

## 2016-05-19 LAB — GLUCOSE, CAPILLARY
Glucose-Capillary: 168 mg/dL — ABNORMAL HIGH (ref 65–99)
Glucose-Capillary: 176 mg/dL — ABNORMAL HIGH (ref 65–99)
Glucose-Capillary: 173 mg/dL — ABNORMAL HIGH (ref 65–99)
Glucose-Capillary: 165 mg/dL — ABNORMAL HIGH (ref 65–99)

## 2016-05-19 NOTE — Progress Notes (Signed)
[name], male   DOB: 1930-02-03, 82 y.o.   MRN: [id number]    Subjective:  No chest pain More shortness of breath   Objective:  Filed Vitals:   05/18/16 1421 05/18/16 1728 05/18/16 1947 05/19/16 0351  BP: 136/68 148/97 147/81 138/74  Pulse:  79 97 96  Temp: 97.7 F (36.5 C)  98.4 F (36.9 C) 97.9 F (36.6 C)  TempSrc: Oral  Oral Oral  Resp: '18  20 18  '$ Height:      Weight:    81.874 kg (180 lb 8 oz)  SpO2: 96% 94% 93% 92%    Intake/Output from previous day:  Intake/Output Summary (Last 24 hours) at 05/19/16 8657 Last data filed at 05/19/16 0500  Gross per 24 hour  Intake 363.33 ml  Output   1200 ml  Net -836.67 ml    Physical Exam: Affect appropriate Chronically ill white male  HEENT: normal Neck supple with no adenopathy JVP normal no bruits no thyromegaly Lungs decreased BS  no wheezing and good diaphragmatic motion Heart:  S1/S2 no murmur, no rub, gallop or click PMI normal Abdomen: benighn, BS positve, no tenderness, no AAA no bruit.  No HSM or HJR Distal pulses intact with no bruits No edema Neuro non-focal Skin warm and dry No muscular weakness RFA A no hematoma    Lab Results: Basic Metabolic Panel:  Recent Labs  05/17/16 2330 05/18/16 0348  NA 136 139  K 3.6 3.5  CL 105 105  CO2 20* 22  GLUCOSE 242* 217*  BUN 24* 24*  CREATININE 1.68* 1.77*  CALCIUM 7.9* 8.2*  PHOS 3.1  --    CBC:  Recent Labs  05/17/16 0407 05/18/16 0348  WBC 9.8 11.0*  HGB 10.4* 9.5*  HCT 32.2* 29.5*  MCV 98.5 97.4  PLT 209 188   Cardiac Enzymes:  Recent Labs  05/17/16 1510 05/18/16 1714  TROPONINI 0.28* 0.50*    Imaging: No results found.  Cardiac Studies:  ECG:  NSR RBBB lateral T wave changes    Telemetry:  NSR  RBBB nonspecific ST changes PVC;s 05/19/2016   Echo: EF 30-35% reviewed   Medications:   . amoxicillin-clavulanate  1 tablet Oral Q12H  . aspirin  81 mg Oral Daily  . carvedilol  3.125 mg Oral BID WC  .  clopidogrel  75 mg Oral Q breakfast  . furosemide  10 mg Oral Daily  . guaiFENesin  600 mg Oral BID  . heparin subcutaneous  5,000 Units Subcutaneous Q8H  . insulin aspart  0-5 Units Subcutaneous QHS  . insulin aspart  0-9 Units Subcutaneous TID WC  . isosorbide dinitrate  10 mg Oral BID  . simvastatin  20 mg Oral QHS  . sodium chloride flush  3 mL Intravenous Q12H  . sodium chloride flush  3 mL Intravenous Q12H  . tamsulosin  0.4 mg Oral QHS        Assessment/Plan:  CHF:  New onset elevated BNP EF 30-35% by echo. 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  3L [place] CPAP at home continue antibiotics PFT;s pending Given likely diagnosis lung CA probably not a surgical candidate. Interesting that PET scan chest normal end  Of last year but will likely need another one have contacted Dr Lamonte Sakai for outpatient f/u and he has an oncologist For his MGUS  Exam more wheezing today f/u CXR and have pulmonary see over weekend in hospital   Not clear why coumadin started ???? Has SR with PACls not afib will stop and start baby aspirin with plavix for severe CAD not totally revascularized     [name] 05/19/2016, 7:27 AM

## 2016-05-19 NOTE — Progress Notes (Signed)
[facility] Progress Note  Patient: Alan Ford GYI:[id number]   PCP: Mathews Argyle, MD DOB: [birth year]   DOA: 05/14/2016   DOS: 05/19/2016   Date of Service: the patient was seen and examined on 05/19/2016  Subjective: Patient complains of shortness of breath. Denies any chest pain or abdominal pain. Occasional chest pressure which is currently absent. No nausea or vomiting Nutrition: Tolerating oral diet  Brief hospital course: Patient was admitted on 05/14/2016, with complaint of chest pain, was found to have non-STEMI, status post heparin drip as well as stenting of the RCA on 05/17/2016. Currently further plan is continue monitoring for respiratory improvement.  Assessment and Plan: 1. Chest pain secondary to non-STEMI. A status post cardiac cath on May 16, severe three-vessel disease, Underwent rotational atherectomy and stenting of mid RCA 518. Currently enrolled in Leaders trial, antiplatelet therapy will be according to the protocol. Dr. Johnsie Cancel recommends to discontinue warfarin and heparin. Currently medical management for the rest of the vessels.  2. Acute systolic CHF. Elevated BNP. EF 30-35% by echo. Low-dose Lasix has been started today. We'll monitor renal function. Continue nitrates as well as hydralazine.  3. Severe COPD, heavy smoker. 3 L nasal cannula and C Pap at home. Possible left lingular mass concerning for cancer. Not a surgical candidate for CABG due to left lung cancer. Chest x-ray shows increased right pleural effusion. We'll discuss with pulmonary regarding thoracentesis if Lasix does not improve patient's respiratory symptoms. Add incentive spirometry. Continue Augmentin. Continue duo nebs  4. Type 2 diabetes mellitus. Currently on sliding scale insulin. Holding home oral hypoglycemic agents.  Pain management: When necessary Tylenol Activity: Will be consulted physical therapy Bowel regimen: last BM 05/15/2016 Diet: Cardiac diet carb  modified DVT Prophylaxis: subcutaneous Heparin  Advance goals of care discussion: Full code  Family Communication: family was present at bedside, at the time of interview. The pt provided permission to discuss medical plan with the family. Opportunity was given to ask question and all questions were answered satisfactorily.   Disposition:  Discharge to home, likely home health Expected discharge date: 05/21/2016  Consultants: Cardiology, nephrology, pulmonary Procedures: Cardiac cath, echocardiogram  Antibiotics: Anti-infectives    Start     Dose/Rate Route Frequency Ordered Stop   05/14/16 1800  amoxicillin-clavulanate (AUGMENTIN) 500-125 MG per tablet 500 mg     1 tablet Oral Every 12 hours 05/14/16 1726 05/19/16 1759   05/14/16 1730  amoxicillin-clavulanate (AUGMENTIN) 875-125 MG per tablet 1 tablet  Status:  Discontinued     1 tablet Oral Daily 05/14/16 1720 05/14/16 1725        Intake/Output Summary (Last 24 hours) at 05/19/16 1719 Last data filed at 05/19/16 1400  Gross per 24 hour  Intake    480 ml  Output   1825 ml  Net  -1345 ml   Filed Weights   05/17/16 0403 05/18/16 0342 05/19/16 0351  Weight: 81.149 kg (178 lb 14.4 oz) 83.008 kg (183 lb) 81.874 kg (180 lb 8 oz)    Objective: Physical Exam: Filed Vitals:   05/19/16 0351 05/19/16 0837 05/19/16 1410 05/19/16 1620  BP: 138/74 140/91 133/65 145/81  Pulse: 96 90 87 94  Temp: 97.9 F (36.6 C)  98.2 F (36.8 C) 99.5 F (37.5 C)  TempSrc: Oral  Oral Oral  Resp: 18   18  Height:      Weight: 81.874 kg (180 lb 8 oz)     SpO2: 92%  96% 96%    General: Alert,  Awake and Oriented to Time, Place and Person. Appear in mild distress Eyes: PERRL, Conjunctiva normal ENT: Oral Mucosa clear moist. Neck: difficult to assess  JVD, no Abnormal Mass Or lumps Cardiovascular: S1 and S2 Present, aortic systolic  Murmur, Peripheral Pulses Present Respiratory: Bilateral Air entry equal and Decreased, basal Crackles, no  wheezes Abdomen: Bowel Sound present, Soft and no tenderness Skin: no redness, no Rash  Extremities: no Pedal edema, no calf tenderness Neurologic: Grossly no focal neuro deficit. Bilaterally Equal motor strength  Data Reviewed: CBC:  Recent Labs Lab 05/14/16 1012  05/15/16 0513 05/16/16 0548 05/17/16 0407 05/18/16 0348 05/19/16 0545  WBC 9.2  < > 10.5 10.9* 9.8 11.0* 8.8  NEUTROABS 7.4  --   --   --   --   --   --   HGB 11.6*  < > 10.9* 10.7* 10.4* 9.5* 9.4*  HCT 34.3*  < > 34.0* 33.1* 32.2* 29.5* 29.1*  MCV 96.9  < > 99.4 99.1 98.5 97.4 98.0  PLT 349.0  < > 251 220 209 188 196  < > = values in this interval not displayed. Basic Metabolic Panel:  Recent Labs Lab 05/16/16 0548 05/17/16 0407 05/17/16 2330 05/18/16 0348 05/19/16 0545  NA 141 138 136 139 140  K 4.0 3.8 3.6 3.5 3.8  CL 109 108 105 105 104  CO2 19* 19* 20* 22 22  GLUCOSE 186* 156* 242* 217* 165*  BUN 30* 29* 24* 24* 20  CREATININE 1.98* 1.81* 1.68* 1.77* 1.74*  CALCIUM 8.5* 8.3* 7.9* 8.2* 8.4*  PHOS  --   --  3.1  --   --     Liver Function Tests:  Recent Labs Lab 05/15/16 0513  AST 32  ALT 13*  ALKPHOS 56  BILITOT 0.9  PROT 6.0*  ALBUMIN 2.8*   No results for input(s): LIPASE, AMYLASE in the last 168 hours. No results for input(s): AMMONIA in the last 168 hours. Coagulation Profile:  Recent Labs Lab 05/14/16 1251 05/17/16 0407 05/18/16 0340 05/19/16 0545  INR 1.10 1.08 1.27 1.28   Cardiac Enzymes:  Recent Labs Lab 05/14/16 1917 05/14/16 2042 05/17/16 1510 05/18/16 1714  TROPONINI 0.33* 0.37* 0.28* 0.50*   BNP (last 3 results)  Recent Labs  05/14/16 1012  PROBNP 1454.0*    CBG:  Recent Labs Lab 05/18/16 1212 05/18/16 1637 05/18/16 2128 05/19/16 0721 05/19/16 1119  GLUCAP 165* 198* 175* 168* 176*    Studies: Dg Chest 2 View  05/19/2016  CLINICAL DATA:  Dyspnea EXAM: CHEST  2 VIEW COMPARISON:  05/14/2016 chest radiograph. FINDINGS: Stable cardiomediastinal  silhouette with top-normal heart size. No pneumothorax. Small to moderate left pleural effusion, increased. No right pleural effusion. Patchy left lung base consolidation, increased. No pulmonary edema. Mildly hyperinflated lungs. Hazy peripheral reticular opacities throughout both lungs, unchanged. IMPRESSION: 1. Worsening patchy left lung base consolidation, nonspecific, suspicious for worsening pneumonia superimposed on lingular mass as suggested on 05/16/2016 chest CT. 2. Small to moderate left pleural effusion, increased. 3. Findings are superimposed on hazy peripheral reticular lung opacities, which may indicate underlying interstitial lung disease. 4. Hyperinflated lungs, suggesting COPD. Electronically Signed   By: Ilona Sorrel M.D.   On: 05/19/2016 12:38     Scheduled Meds: . amoxicillin-clavulanate  1 tablet Oral Q12H  . aspirin  81 mg Oral Daily  . carvedilol  3.125 mg Oral BID WC  . clopidogrel  75 mg Oral Q breakfast  . furosemide  10 mg Oral Daily  .  guaiFENesin  600 mg Oral BID  . heparin subcutaneous  5,000 Units Subcutaneous Q8H  . insulin aspart  0-5 Units Subcutaneous QHS  . insulin aspart  0-9 Units Subcutaneous TID WC  . isosorbide dinitrate  10 mg Oral BID  . simvastatin  20 mg Oral QHS  . sodium chloride flush  3 mL Intravenous Q12H  . sodium chloride flush  3 mL Intravenous Q12H  . tamsulosin  0.4 mg Oral QHS   Continuous Infusions:  PRN Meds: sodium chloride, acetaminophen, albuterol, ondansetron (ZOFRAN) IV, oxyCODONE-acetaminophen, sodium chloride flush  Time spent: 30 minutes  Author: Berle Mull, MD [facility] Pager: [phone] 05/19/2016 5:19 PM  If 7PM-7AM, please contact night-coverage at [email or web address], password [facility]

## 2016-05-19 NOTE — Progress Notes (Signed)
CARDIAC REHAB PHASE I   PRE:  Rate/Rhythm: 101 St  BP:  Sitting: 120/76      SaO2: 91 3L  MODE:  Ambulation: 500 ft   POST:  Rate/Rhythm: 106 ST  BP:  Sitting: 129/76     SaO2: 84 4L increased to 91 with PLB [id number] Patient ambulated in hallway x 1 assist with RW. Steady gait noted. No restbreaks taken. Continuous O2 saturations monitored. Patient encouraged to PLB during walk. Lowest sats 87%. Patient states PLB "really helps with shortness of breath". Post ambulation patient to chair. Once sitting desaturated to 84%. Coached patient on PLB and saturations increased to 91%. Place back to wall O2 at 3L. Call bell, cell phone, room phone, and bedside table in reach. Chair alarm activated. Primary RN notified. Will continue to follow Monday.  Trentyn Boisclair English PayneRN, BSN 05/19/2016 9:29 AM

## 2016-05-19 NOTE — Care Management Important Message (Signed)
Important Message  Patient Details  Name: Alan Ford MRN: [id number] Date of Birth: May 05, 1930   Medicare Important Message Given:  Yes    Yesmin Mutch Abena 05/19/2016, 11:08 AM

## 2016-05-19 NOTE — Progress Notes (Signed)
Admit: 05/14/2016 LOS: 5  3M with CKD3-4, nephrotic proteinuria, admitted with NSTEMI and found to have severe 3V diseae and Lingular lung mass with LAN concerning for malignancy.  Subjective:  Doingw ell stable GFR Good UOP  05/19 0701 - 05/20 0700 In: 363.3 [P.O.:240; I.V.:123.3] Out: 1200 [Urine:1200]  Filed Weights   05/17/16 0403 05/18/16 0342 05/19/16 0351  Weight: 81.149 kg (178 lb 14.4 oz) 83.008 kg (183 lb) 81.874 kg (180 lb 8 oz)    Scheduled Meds: . amoxicillin-clavulanate  1 tablet Oral Q12H  . aspirin  81 mg Oral Daily  . carvedilol  3.125 mg Oral BID WC  . clopidogrel  75 mg Oral Q breakfast  . furosemide  10 mg Oral Daily  . guaiFENesin  600 mg Oral BID  . heparin subcutaneous  5,000 Units Subcutaneous Q8H  . insulin aspart  0-5 Units Subcutaneous QHS  . insulin aspart  0-9 Units Subcutaneous TID WC  . isosorbide dinitrate  10 mg Oral BID  . simvastatin  20 mg Oral QHS  . sodium chloride flush  3 mL Intravenous Q12H  . sodium chloride flush  3 mL Intravenous Q12H  . tamsulosin  0.4 mg Oral QHS   Continuous Infusions:   PRN Meds:.sodium chloride, acetaminophen, albuterol, ondansetron (ZOFRAN) IV, oxyCODONE-acetaminophen, sodium chloride flush  Current Labs: reviewed    Physical Exam:  Blood pressure 140/91, pulse 90, temperature 97.9 F (36.6 C), temperature source Oral, resp. rate 18, height '5\' 10"'$  (1.778 m), weight 81.874 kg (180 lb 8 oz), SpO2 92 %. GEN: NAD ENT: [place] in place EYES: EOMI CV: RRR PULM: diminished throughout ABD: s/nt/nd, no bruit SKIN: no rasehs/lesions EXT:no edema  Assessment  1. CKD3-4 at baseline with nephrotic proteinuria (proteinuria = Diabetic disease or iMN) 2. NSTEMI, 3V CAD, RCA culprit; s/p PCI 5/18 3. Lingular Lung Mass with LAN: malignancy vs recent PNA 4. COPD 5. DM2 6. HTN 7. Anemia,mild 8. sCHF  Plan 1. No evidence of CIN 2. Will sign off for now, can f/u with Dr. Moshe Cipro as prev arranged at outpt  visit    Pearson Grippe MD 05/19/2016, 11:33 AM   Recent Labs Lab 05/17/16 2330 05/18/16 0348 05/19/16 0545  NA 136 139 140  K 3.6 3.5 3.8  CL 105 105 104  CO2 20* 22 22  GLUCOSE 242* 217* 165*  BUN 24* 24* 20  CREATININE 1.68* 1.77* 1.74*  CALCIUM 7.9* 8.2* 8.4*  PHOS 3.1  --   --     Recent Labs Lab 05/14/16 1012  05/17/16 0407 05/18/16 0348 05/19/16 0545  WBC 9.2  < > 9.8 11.0* 8.8  NEUTROABS 7.4  --   --   --   --   HGB 11.6*  < > 10.4* 9.5* 9.4*  HCT 34.3*  < > 32.2* 29.5* 29.1*  MCV 96.9  < > 98.5 97.4 98.0  PLT 349.0  < > 209 188 196  < > = values in this interval not displayed.

## 2016-05-20 DIAGNOSIS — I209 Angina pectoris, unspecified: Secondary | ICD-10-CM

## 2016-05-20 DIAGNOSIS — J90 Pleural effusion, not elsewhere classified: Secondary | ICD-10-CM | POA: Insufficient documentation

## 2016-05-20 DIAGNOSIS — E7800 Pure hypercholesterolemia, unspecified: Secondary | ICD-10-CM

## 2016-05-20 LAB — CBC WITH DIFFERENTIAL/PLATELET
BASOS PCT: 0 %
Basophils Absolute: 0 10*3/uL (ref 0.0–0.1)
EOS ABS: 0.1 10*3/uL (ref 0.0–0.7)
Eosinophils Relative: 1 %
HEMATOCRIT: 28.2 % — AB (ref 39.0–52.0)
Hemoglobin: 9 g/dL — ABNORMAL LOW (ref 13.0–17.0)
Lymphocytes Relative: 20 %
Lymphs Abs: 2 10*3/uL (ref 0.7–4.0)
MCH: 31.3 pg (ref 26.0–34.0)
MCHC: 31.9 g/dL (ref 30.0–36.0)
MCV: 97.9 fL (ref 78.0–100.0)
MONO ABS: 1.3 10*3/uL — AB (ref 0.1–1.0)
MONOS PCT: 14 %
Neutro Abs: 6.4 10*3/uL (ref 1.7–7.7)
Neutrophils Relative %: 65 %
Platelets: 193 10*3/uL (ref 150–400)
RBC: 2.88 MIL/uL — ABNORMAL LOW (ref 4.22–5.81)
RDW: 14.1 % (ref 11.5–15.5)
WBC: 9.8 10*3/uL (ref 4.0–10.5)

## 2016-05-20 LAB — COMPREHENSIVE METABOLIC PANEL
ALBUMIN: 2.2 g/dL — AB (ref 3.5–5.0)
ALT: 21 U/L (ref 17–63)
ANION GAP: 12 (ref 5–15)
AST: 23 U/L (ref 15–41)
Alkaline Phosphatase: 54 U/L (ref 38–126)
BILIRUBIN TOTAL: 0.7 mg/dL (ref 0.3–1.2)
BUN: 21 mg/dL — ABNORMAL HIGH (ref 6–20)
CO2: 22 mmol/L (ref 22–32)
Calcium: 8.3 mg/dL — ABNORMAL LOW (ref 8.9–10.3)
Chloride: 103 mmol/L (ref 101–111)
Creatinine, Ser: 1.99 mg/dL — ABNORMAL HIGH (ref 0.61–1.24)
GFR calc non Af Amer: 29 mL/min — ABNORMAL LOW (ref >60)
GFR, EST AFRICAN AMERICAN: 34 mL/min — AB (ref >60)
GLUCOSE: 159 mg/dL — AB (ref 65–99)
POTASSIUM: 3.9 mmol/L (ref 3.5–5.1)
SODIUM: 137 mmol/L (ref 135–145)
TOTAL PROTEIN: 5.4 g/dL — AB (ref 6.5–8.1)

## 2016-05-20 LAB — GLUCOSE, CAPILLARY
GLUCOSE-CAPILLARY: 168 mg/dL — AB (ref 65–99)
GLUCOSE-CAPILLARY: 146 mg/dL — AB (ref 65–99)
Glucose-Capillary: 271 mg/dL — ABNORMAL HIGH (ref 65–99)
Glucose-Capillary: 184 mg/dL — ABNORMAL HIGH (ref 65–99)

## 2016-05-20 LAB — PROTIME-INR
INR: 1.14 (ref 0.00–1.49)
PROTHROMBIN TIME: 14.8 s (ref 11.6–15.2)

## 2016-05-20 LAB — MAGNESIUM: Magnesium: 1.8 mg/dL (ref 1.7–2.4)

## 2016-05-20 MED ORDER — SENNOSIDES-DOCUSATE SODIUM 8.6-50 MG PO TABS
1.0000 | ORAL_TABLET | Freq: Two times a day (BID) | ORAL | Status: DC
  Administered 2016-05-20 – 2016-05-22 (×4): 1 via ORAL
  Filled 2016-05-20 (×4): qty 1

## 2016-05-20 MED ORDER — POLYETHYLENE GLYCOL 3350 17 G PO PACK
17.0000 g | PACK | Freq: Every day | ORAL | Status: DC
  Administered 2016-05-20 – 2016-05-22 (×3): 17 g via ORAL
  Filled 2016-05-20 (×3): qty 1

## 2016-05-20 MED ORDER — FUROSEMIDE 20 MG PO TABS
20.0000 mg | ORAL_TABLET | Freq: Two times a day (BID) | ORAL | Status: DC
  Administered 2016-05-20 – 2016-05-22 (×5): 20 mg via ORAL
  Filled 2016-05-20 (×5): qty 1

## 2016-05-20 NOTE — Plan of Care (Signed)
Problem: Activity: Goal: Capacity to carry out activities will improve Outcome: Progressing Patient participating in cardiac rehab (see cardiac RN(s) note for detailed information).  This RN provides uninterrupted sleeping periods for patient.

## 2016-05-20 NOTE — Progress Notes (Signed)
[name], male   DOB: 1930-04-08, 82 y.o.   MRN: [id number]    Subjective:  No chest pain More shortness of breath   Objective:  Filed Vitals:   05/19/16 1620 05/19/16 1950 05/20/16 0036 05/20/16 0500  BP: 145/81 148/90  150/75  Pulse: 94 92 90 85  Temp: 99.5 F (37.5 C) 99.3 F (37.4 C)  98.1 F (36.7 C)  TempSrc: Oral Oral  Oral  Resp: '18 18 18 20  '$ Height:      Weight:    80.65 kg (177 lb 12.8 oz)  SpO2: 96% 94% 91% 92%    Intake/Output from previous day:  Intake/Output Summary (Last 24 hours) at 05/20/16 0719 Last data filed at 05/20/16 0100  Gross per 24 hour  Intake    920 ml  Output   1125 ml  Net   -205 ml    Physical Exam: Affect appropriate Chronically ill white male  HEENT: normal Neck supple with no adenopathy JVP normal no bruits no thyromegaly Lungs decreased BS  Right base  no wheezing and good diaphragmatic motion Heart:  S1/S2 no murmur, no rub, gallop or click PMI normal Abdomen: benighn, BS positve, no tenderness, no AAA no bruit.  No HSM or HJR Distal pulses intact with no bruits No edema Neuro non-focal Skin warm and dry No muscular weakness RFA A no hematoma    Lab Results: Basic Metabolic Panel:  Recent Labs  05/17/16 2330  05/19/16 0545 05/20/16 0357  NA 136  < > 140 137  K 3.6  < > 3.8 3.9  CL 105  < > 104 103  CO2 20*  < > 22 22  GLUCOSE 242*  < > 165* 159*  BUN 24*  < > 20 21*  CREATININE 1.68*  < > 1.74* 1.99*  CALCIUM 7.9*  < > 8.4* 8.3*  MG  --   --   --  1.8  PHOS 3.1  --   --   --   < > = values in this interval not displayed. CBC:  Recent Labs  05/19/16 0545 05/20/16 0357  WBC 8.8 9.8  NEUTROABS  --  6.4  HGB 9.4* 9.0*  HCT 29.1* 28.2*  MCV 98.0 97.9  PLT 196 193   Cardiac Enzymes:  Recent Labs  05/17/16 1510 05/18/16 1714  TROPONINI 0.28* 0.50*    Imaging: Dg Chest 2 View  05/19/2016  CLINICAL DATA:  Dyspnea EXAM: CHEST  2 VIEW COMPARISON:  05/14/2016 chest radiograph.  FINDINGS: Stable cardiomediastinal silhouette with top-normal heart size. No pneumothorax. Small to moderate left pleural effusion, increased. No right pleural effusion. Patchy left lung base consolidation, increased. No pulmonary edema. Mildly hyperinflated lungs. Hazy peripheral reticular opacities throughout both lungs, unchanged. IMPRESSION: 1. Worsening patchy left lung base consolidation, nonspecific, suspicious for worsening pneumonia superimposed on lingular mass as suggested on 05/16/2016 chest CT. 2. Small to moderate left pleural effusion, increased. 3. Findings are superimposed on hazy peripheral reticular lung opacities, which may indicate underlying interstitial lung disease. 4. Hyperinflated lungs, suggesting COPD. Electronically Signed   By: Ilona Sorrel M.D.   On: 05/19/2016 12:38    Cardiac Studies:  ECG:  NSR RBBB lateral T wave changes    Telemetry:  NSR  RBBB nonspecific ST changes PVC;s 05/20/2016   Echo: EF 30-35% reviewed   Medications:   . aspirin  81 mg Oral Daily  . carvedilol  3.125 mg Oral BID WC  . clopidogrel  75 mg  Oral Q breakfast  . furosemide  10 mg Oral Daily  . guaiFENesin  600 mg Oral BID  . heparin subcutaneous  5,000 Units Subcutaneous Q8H  . insulin aspart  0-5 Units Subcutaneous QHS  . insulin aspart  0-9 Units Subcutaneous TID WC  . isosorbide dinitrate  10 mg Oral BID  . simvastatin  20 mg Oral QHS  . sodium chloride flush  3 mL Intravenous Q12H  . sodium chloride flush  3 mL Intravenous Q12H  . tamsulosin  0.4 mg Oral QHS        Assessment/Plan:  CHF:  New onset elevated BNP EF 30-35% by echo. Start low dose laisix now that cath/PCI done follow Cr stable Increase lasix to bid f/u CXR  Monday and do thoracentesis if if pleural effusion still significant  Rx nitrates and hydralazine no ACE   CRF:  CR 1.77 with HCO3 and NS   likely related to MGUS    CAD:  Post rotoblator of RCA Residual complex circumflex and LAD disease Plan medical Rx  due to comorbidities Consider off pump LIMA in future but issues regarding COPD and lung cancer with porcelin aorta limits options See consult note by Dr Roxy Manns  Pulmonary:  Severe COPD previous heavy smoker.  3L [place] CPAP at home continue antibiotics PFT;s pending Given likely diagnosis lung CA probably not a surgical candidate. Interesting that PET scan chest normal end  Of last year but will likely need another one have contacted Dr Lamonte Sakai for outpatient f/u and he has an oncologist For his MGUS  Exam more wheezing today f/u CXR and have pulmonary see over weekend in hospital   Not clear why coumadin started ???? Has SR with PACls not afib will stop and start baby aspirin with plavix for severe CAD not totally revascularized     Alan Ford 05/20/2016, 7:19 AM

## 2016-05-20 NOTE — Progress Notes (Signed)
[facility] Progress Note  Patient: Alan Ford MHD:[id number]   PCP: Mathews Argyle, MD DOB: April 03, 1930   DOA: 05/14/2016   DOS: 05/20/2016   Date of Service: the patient was seen and examined on 05/20/2016  Subjective: Continues to complain of shortness of breath as well as fatigue. Chest pressure resolved. No nausea no vomiting. Nutrition: Tolerating oral diet  Brief hospital course: Patient was admitted on 05/14/2016, with complaint of chest pain, was found to have non-STEMI, status post heparin drip as well as stenting of the RCA on 05/17/2016. Cardiothoracic surgery was consulted on May 16 for severe triple vessel disease but felt that the patient would not be a candidate for surgery due to concern for lung cancer. Pulmonary was also consulted and recommended outpatient follow-up. Nephrology was consulted for preop clearance for cardiac cath. Currently further plan is continue monitoring for respiratory improvement.  Assessment and Plan: 1. Chest pain secondary to non-STEMI. A status post cardiac cath on May 16, severe three-vessel disease, Underwent rotational atherectomy and stenting of mid RCA 5/18. Currently enrolled in [facility]ers trial, antiplatelet therapy will be according to the protocol. Dr. Johnsie Cancel recommends to discontinue warfarin and heparin. Cardiothoracic surgery consulted and due to presence of the lung nodule concerning for cancer on the left lobe, Currently medical management for the rest of the vessels.  2. Acute systolic CHF. Elevated BNP. EF 30-35% by echo. Currently on Lasix,We'll monitor renal function. Continue nitrates as well as hydralazine.  3. Severe COPD, heavy smoker. Possible exacerbation 3 L nasal cannula and C Pap at home. Possible left lingular mass concerning for cancer. Left pleural effusion moderate Not a surgical candidate for CABG due to concern for left lung cancer. Chest x-ray shows increased left pleural effusion. I discussed with  pulmonary regarding follow-up for possible thoracentesis if there is no improvement in repeat chest x-ray on 05/21/2016. Add incentive spirometry. Continue Augmentin. Continue duo nebs  4. Type 2 diabetes mellitus. Currently on sliding scale insulin. Holding home oral hypoglycemic agents.  Pain management: When necessary Tylenol Activity: Home health OT and PT on discharge per physical therapy Bowel regimen: last BM 05/15/2016 MiraLAX and Senokot added Diet: Cardiac diet carb modified DVT Prophylaxis: subcutaneous Heparin  Advance goals of care discussion: Full code  Family Communication: no family was present at bedside, at the time of interview.   Disposition:  Discharge to home, home health Expected discharge date: 05/23/2016  Consultants: Cardiology, nephrology, pulmonary Procedures: Cardiac cath, echocardiogram  Antibiotics: Anti-infectives    Start     Dose/Rate Route Frequency Ordered Stop   05/14/16 1800  amoxicillin-clavulanate (AUGMENTIN) 500-125 MG per tablet 500 mg     1 tablet Oral Every 12 hours 05/14/16 1726 05/19/16 1759   05/14/16 1730  amoxicillin-clavulanate (AUGMENTIN) 875-125 MG per tablet 1 tablet  Status:  Discontinued     1 tablet Oral Daily 05/14/16 1720 05/14/16 1725        Intake/Output Summary (Last 24 hours) at 05/20/16 1518 Last data filed at 05/20/16 1300  Gross per 24 hour  Intake   1160 ml  Output    825 ml  Net    335 ml   Filed Weights   05/18/16 0342 05/19/16 0351 05/20/16 0500  Weight: 83.008 kg (183 lb) 81.874 kg (180 lb 8 oz) 80.65 kg (177 lb 12.8 oz)    Objective: Physical Exam: Filed Vitals:   05/20/16 0036 05/20/16 0500 05/20/16 0924 05/20/16 1430  BP:  150/75 133/61 150/66  Pulse: 90 85  92 81  Temp:  98.1 F (36.7 C)  98.1 F (36.7 C)  TempSrc:  Oral  Oral  Resp: '18 20  20  '$ Height:      Weight:  80.65 kg (177 lb 12.8 oz)    SpO2: 91% 92%  94%    General: Alert, Awake and Oriented to Time, Place and Person.  Appear in mild distress Eyes: PERRL, Conjunctiva normal ENT: Oral Mucosa clear moist. Neck: difficult to assess  JVD, no Abnormal Mass Or lumps Cardiovascular: S1 and S2 Present, aortic systolic  Murmur, Peripheral Pulses Present Respiratory: Bilateral Air entry equal and Decreased, basal Crackles, no wheezes Abdomen: Bowel Sound present, Soft and no tenderness Skin: no redness, no Rash  Extremities: no Pedal edema, no calf tenderness Neurologic: Grossly no focal neuro deficit. Bilaterally Equal motor strength  Data Reviewed: CBC:  Recent Labs Lab 05/14/16 1012  05/16/16 0548 05/17/16 0407 05/18/16 0348 05/19/16 0545 05/20/16 0357  WBC 9.2  < > 10.9* 9.8 11.0* 8.8 9.8  NEUTROABS 7.4  --   --   --   --   --  6.4  HGB 11.6*  < > 10.7* 10.4* 9.5* 9.4* 9.0*  HCT 34.3*  < > 33.1* 32.2* 29.5* 29.1* 28.2*  MCV 96.9  < > 99.1 98.5 97.4 98.0 97.9  PLT 349.0  < > 220 209 188 196 193  < > = values in this interval not displayed. Basic Metabolic Panel:  Recent Labs Lab 05/17/16 0407 05/17/16 2330 05/18/16 0348 05/19/16 0545 05/20/16 0357  NA 138 136 139 140 137  K 3.8 3.6 3.5 3.8 3.9  CL 108 105 105 104 103  CO2 19* 20* '22 22 22  '$ GLUCOSE 156* 242* 217* 165* 159*  BUN 29* 24* 24* 20 21*  CREATININE 1.81* 1.68* 1.77* 1.74* 1.99*  CALCIUM 8.3* 7.9* 8.2* 8.4* 8.3*  MG  --   --   --   --  1.8  PHOS  --  3.1  --   --   --     Liver Function Tests:  Recent Labs Lab 05/15/16 0513 05/20/16 0357  AST 32 23  ALT 13* 21  ALKPHOS 56 54  BILITOT 0.9 0.7  PROT 6.0* 5.4*  ALBUMIN 2.8* 2.2*   No results for input(s): LIPASE, AMYLASE in the last 168 hours. No results for input(s): AMMONIA in the last 168 hours. Coagulation Profile:  Recent Labs Lab 05/14/16 1251 05/17/16 0407 05/18/16 0340 05/19/16 0545 05/20/16 0357  INR 1.10 1.08 1.27 1.28 1.14   Cardiac Enzymes:  Recent Labs Lab 05/14/16 1917 05/14/16 2042 05/17/16 1510 05/18/16 1714  TROPONINI 0.33* 0.37*  0.28* 0.50*   BNP (last 3 results)  Recent Labs  05/14/16 1012  PROBNP 1454.0*    CBG:  Recent Labs Lab 05/19/16 1119 05/19/16 1635 05/19/16 2259 05/20/16 0734 05/20/16 1122  GLUCAP 176* 173* 165* 168* 271*    Studies: No results found.   Scheduled Meds: . aspirin  81 mg Oral Daily  . carvedilol  3.125 mg Oral BID WC  . clopidogrel  75 mg Oral Q breakfast  . furosemide  20 mg Oral BID  . guaiFENesin  600 mg Oral BID  . heparin subcutaneous  5,000 Units Subcutaneous Q8H  . insulin aspart  0-5 Units Subcutaneous QHS  . insulin aspart  0-9 Units Subcutaneous TID WC  . isosorbide dinitrate  10 mg Oral BID  . simvastatin  20 mg Oral QHS  . sodium chloride flush  3  mL Intravenous Q12H  . sodium chloride flush  3 mL Intravenous Q12H  . tamsulosin  0.4 mg Oral QHS   Continuous Infusions:  PRN Meds: sodium chloride, acetaminophen, albuterol, ondansetron (ZOFRAN) IV, oxyCODONE-acetaminophen, sodium chloride flush  Time spent: 30 minutes  Author: Berle Mull, MD [facility] Pager: [phone] 05/20/2016 3:18 PM  If 7PM-7AM, please contact night-coverage at [email or web address], password [facility]

## 2016-05-20 NOTE — Procedures (Signed)
Placed patient on CPAP auto 5-20cmH20 for the night.  Patient is tolerating well at this time.

## 2016-05-21 ENCOUNTER — Inpatient Hospital Stay (HOSPITAL_COMMUNITY): Payer: Medicare Other

## 2016-05-21 DIAGNOSIS — I10 Essential (primary) hypertension: Secondary | ICD-10-CM

## 2016-05-21 DIAGNOSIS — J441 Chronic obstructive pulmonary disease with (acute) exacerbation: Secondary | ICD-10-CM

## 2016-05-21 DIAGNOSIS — E785 Hyperlipidemia, unspecified: Secondary | ICD-10-CM

## 2016-05-21 DIAGNOSIS — E1169 Type 2 diabetes mellitus with other specified complication: Secondary | ICD-10-CM | POA: Insufficient documentation

## 2016-05-21 DIAGNOSIS — R7989 Other specified abnormal findings of blood chemistry: Secondary | ICD-10-CM

## 2016-05-21 DIAGNOSIS — R778 Other specified abnormalities of plasma proteins: Secondary | ICD-10-CM | POA: Insufficient documentation

## 2016-05-21 DIAGNOSIS — J90 Pleural effusion, not elsewhere classified: Secondary | ICD-10-CM

## 2016-05-21 LAB — COMPREHENSIVE METABOLIC PANEL
ALBUMIN: 2.2 g/dL — AB (ref 3.5–5.0)
ALT: 24 U/L (ref 17–63)
ANION GAP: 9 (ref 5–15)
AST: 22 U/L (ref 15–41)
Alkaline Phosphatase: 58 U/L (ref 38–126)
BUN: 23 mg/dL — ABNORMAL HIGH (ref 6–20)
CHLORIDE: 105 mmol/L (ref 101–111)
CO2: 23 mmol/L (ref 22–32)
Calcium: 8.5 mg/dL — ABNORMAL LOW (ref 8.9–10.3)
Creatinine, Ser: 1.88 mg/dL — ABNORMAL HIGH (ref 0.61–1.24)
GFR calc non Af Amer: 31 mL/min — ABNORMAL LOW (ref >60)
GFR, EST AFRICAN AMERICAN: 36 mL/min — AB (ref >60)
GLUCOSE: 162 mg/dL — AB (ref 65–99)
POTASSIUM: 4 mmol/L (ref 3.5–5.1)
SODIUM: 137 mmol/L (ref 135–145)
Total Bilirubin: 0.6 mg/dL (ref 0.3–1.2)
Total Protein: 5.7 g/dL — ABNORMAL LOW (ref 6.5–8.1)

## 2016-05-21 LAB — CBC WITH DIFFERENTIAL/PLATELET
BASOS PCT: 0 %
Basophils Absolute: 0 10*3/uL (ref 0.0–0.1)
EOS ABS: 0.1 10*3/uL (ref 0.0–0.7)
EOS PCT: 1 %
HCT: 29.3 % — ABNORMAL LOW (ref 39.0–52.0)
HEMOGLOBIN: 9.4 g/dL — AB (ref 13.0–17.0)
LYMPHS ABS: 2.1 10*3/uL (ref 0.7–4.0)
Lymphocytes Relative: 21 %
MCH: 32 pg (ref 26.0–34.0)
MCHC: 32.1 g/dL (ref 30.0–36.0)
MCV: 99.7 fL (ref 78.0–100.0)
MONOS PCT: 10 %
Monocytes Absolute: 1.1 10*3/uL — ABNORMAL HIGH (ref 0.1–1.0)
NEUTROS PCT: 68 %
Neutro Abs: 6.9 10*3/uL (ref 1.7–7.7)
PLATELETS: 218 10*3/uL (ref 150–400)
RBC: 2.94 MIL/uL — ABNORMAL LOW (ref 4.22–5.81)
RDW: 13.9 % (ref 11.5–15.5)
WBC: 10.2 10*3/uL (ref 4.0–10.5)

## 2016-05-21 LAB — GLUCOSE, CAPILLARY
GLUCOSE-CAPILLARY: 167 mg/dL — AB (ref 65–99)
GLUCOSE-CAPILLARY: 336 mg/dL — AB (ref 65–99)
GLUCOSE-CAPILLARY: 99 mg/dL (ref 65–99)
GLUCOSE-CAPILLARY: 197 mg/dL — AB (ref 65–99)

## 2016-05-21 LAB — MAGNESIUM: Magnesium: 1.7 mg/dL (ref 1.7–2.4)

## 2016-05-21 LAB — PROTIME-INR
INR: 1.14 (ref 0.00–1.49)
Prothrombin Time: 14.8 seconds (ref 11.6–15.2)

## 2016-05-21 NOTE — Progress Notes (Addendum)
[name], male   DOB: 1930-05-24, 82 y.o.   MRN: [id number]  PROGRESS NOTE    Alan Ford  WUJ:[id number] DOB: 07-29-1930 DOA: 05/14/2016  PCP: Mathews Argyle, MD   Brief Narrative:  82 years old male with past medical history of hypertension, dyslipidemia, diabetes who presented to [facility] ED 5/15 with complaint of chest pain. He was subsequently found to have non-STEMI, status post heparin drip as well as stenting of the RCA on 05/17/2016. Cardiothoracic surgery was consulted on May 16 for severe triple vessel disease but felt that the patient would not be a candidate for surgery due to concern for lung cancer. Pulmonary was also consulted and recommended outpatient follow-up. Nephrology was consulted for preop clearance for cardiac cath.  Assessment & Plan:  Chest pain secondary to non-STEMI / Elevated troponin level  - Status post cardiac cath on 05/15/2016, severe three-vessel disease, - S/P rotational atherectomy and stenting of mid RCA 5/18. - Enrolled in [employer] trial, antiplatelet therapy will be according to the protocol. - Dr. Johnsie Cancel of cardiology recommended to discontinue warfarin and heparin. - Cardiothoracic surgery consulted and due to presence of the lung nodule concerning for cancer on the left lobe; felt he is not candidate for surgery due to concern for lung cancer - Continue aspirin and Plavix  Acute on chronic systolic and diastolic CHF - Compensated - EF 30-35% on 05/14/2016 - Continue Lasix 20 mg twice daily - Continue aspirin and Plavix  Essential hypertension - Continue carvedilol 3.125 mg twice daily and isosorbide dinitrate 10 mg twice daily  Acute COPD exacerbation / smoking history - Continue oxygen support via nasal cannula to keep oxygen saturation above 90% - Chest x-ray 05/21/2016 showed stable left lower lobe consultation and associated pleural effusion, left lung mass seen on CT scan 05/16/2016 and not readily appreciated on this  study  Type 2 diabetes mellitus without complications without long-term insulin use - Sliding-scale insulin  Dyslipidemia associated with type 2 diabetes mellitus - Continue statin.    DVT prophylaxis: Heparin subQ Code Status: full code  Family Communication: no family at the bedside  Disposition Plan: home once more stable in regards to resp status    Consultants:   Cardiology  Nephrology   Pulmonary   Procedures:   Cardiac cath  Echocardiogram  Antimicrobials:   None     Subjective: Feels little short of breath.  Objective: Filed Vitals:   05/20/16 2100 05/20/16 2308 05/21/16 0420 05/21/16 1145  BP: 152/69  135/60   Pulse: 83 85 88   Temp: 98.2 F (36.8 C)  98.2 F (36.8 C)   TempSrc: Oral  Oral   Resp: '20 18 18   '$ Height:      Weight:   80.151 kg (176 lb 11.2 oz)   SpO2: 93% 92% 93% 84%    Intake/Output Summary (Last 24 hours) at 05/21/16 1438 Last data filed at 05/21/16 0900  Gross per 24 hour  Intake    600 ml  Output    500 ml  Net    100 ml   Filed Weights   05/19/16 0351 05/20/16 0500 05/21/16 0420  Weight: 81.874 kg (180 lb 8 oz) 80.65 kg (177 lb 12.8 oz) 80.151 kg (176 lb 11.2 oz)    Examination:  General exam: Appears calm and comfortable  Respiratory system: Diminished, no rhonchi  Cardiovascular system: S1 & S2 heard, RRR. No JVD, murmurs, rubs, gallops or clicks. No pedal edema. Gastrointestinal system: Abdomen is  nondistended, soft and nontender. No organomegaly or masses felt. Normal bowel sounds heard. Central nervous system: Alert and oriented. No focal neurological deficits. Extremities: Symmetric 5 x 5 power. Skin: No rashes, lesions or ulcers Psychiatry: Judgement and insight appear normal. Mood & affect appropriate.   Data Reviewed: I have personally reviewed following labs and imaging studies  CBC:  Recent Labs Lab 05/17/16 0407 05/18/16 0348 05/19/16 0545 05/20/16 0357 05/21/16 0439  WBC 9.8 11.0* 8.8 9.8  10.2  NEUTROABS  --   --   --  6.4 6.9  HGB 10.4* 9.5* 9.4* 9.0* 9.4*  HCT 32.2* 29.5* 29.1* 28.2* 29.3*  MCV 98.5 97.4 98.0 97.9 99.7  PLT 209 188 196 193 415   Basic Metabolic Panel:  Recent Labs Lab 05/17/16 2330 05/18/16 0348 05/19/16 0545 05/20/16 0357 05/21/16 0439  NA 136 139 140 137 137  K 3.6 3.5 3.8 3.9 4.0  CL 105 105 104 103 105  CO2 20* '22 22 22 23  '$ GLUCOSE 242* 217* 165* 159* 162*  BUN 24* 24* 20 21* 23*  CREATININE 1.68* 1.77* 1.74* 1.99* 1.88*  CALCIUM 7.9* 8.2* 8.4* 8.3* 8.5*  MG  --   --   --  1.8 1.7  PHOS 3.1  --   --   --   --    GFR: Estimated Creatinine Clearance: 30.2 mL/min (by C-G formula based on Cr of 1.88). Liver Function Tests:  Recent Labs Lab 05/15/16 0513 05/20/16 0357 05/21/16 0439  AST 32 23 22  ALT 13* 21 24  ALKPHOS 56 54 58  BILITOT 0.9 0.7 0.6  PROT 6.0* 5.4* 5.7*  ALBUMIN 2.8* 2.2* 2.2*   No results for input(s): LIPASE, AMYLASE in the last 168 hours. No results for input(s): AMMONIA in the last 168 hours. Coagulation Profile:  Recent Labs Lab 05/17/16 0407 05/18/16 0340 05/19/16 0545 05/20/16 0357 05/21/16 0439  INR 1.08 1.27 1.28 1.14 1.14   Cardiac Enzymes:  Recent Labs Lab 05/14/16 1917 05/14/16 2042 05/17/16 1510 05/18/16 1714  TROPONINI 0.33* 0.37* 0.28* 0.50*   BNP (last 3 results)  Recent Labs  05/14/16 1012  PROBNP 1454.0*   HbA1C: No results for input(s): HGBA1C in the last 72 hours. CBG:  Recent Labs Lab 05/20/16 1122 05/20/16 1642 05/20/16 2107 05/21/16 0752 05/21/16 1154  GLUCAP 271* 146* 184* 167* 336*   Lipid Profile: No results for input(s): CHOL, HDL, LDLCALC, TRIG, CHOLHDL, LDLDIRECT in the last 72 hours. Thyroid Function Tests: No results for input(s): TSH, T4TOTAL, FREET4, T3FREE, THYROIDAB in the last 72 hours. Anemia Panel: No results for input(s): VITAMINB12, FOLATE, FERRITIN, TIBC, IRON, RETICCTPCT in the last 72 hours. Urine analysis:    Component Value  Date/Time   COLORURINE STRAW* 05/15/2016 1927   APPEARANCEUR CLEAR 05/15/2016 1927   LABSPEC 1.016 05/15/2016 1927   PHURINE 6.5 05/15/2016 1927   GLUCOSEU 250* 05/15/2016 1927   HGBUR SMALL* 05/15/2016 1927   BILIRUBINUR NEGATIVE 05/15/2016 [place] NEGATIVE 05/15/2016 1927   PROTEINUR 100* 05/15/2016 1927   UROBILINOGEN 0.2 10/06/2009 0923   NITRITE NEGATIVE 05/15/2016 1927   LEUKOCYTESUR NEGATIVE 05/15/2016 1927   Sepsis Labs: '@LABRCNTIP'$ (procalcitonin:4,lacticidven:4)   Recent Results (from the past 240 hour(s))  MRSA PCR Screening     Status: None   Collection Time: 05/14/16  6:01 PM  Result Value Ref Range Status   MRSA by PCR NEGATIVE NEGATIVE Final  Culture, blood (routine x 2) Call MD if unable to obtain prior to antibiotics being given  Status: None   Collection Time: 05/14/16  6:54 PM  Result Value Ref Range Status   Specimen Description BLOOD RIGHT ANTECUBITAL  Final   Special Requests BOTTLES DRAWN AEROBIC AND ANAEROBIC 5CC  Final   Culture NO GROWTH 5 DAYS  Final   Report Status 05/19/2016 FINAL  Final  Culture, blood (routine x 2) Call MD if unable to obtain prior to antibiotics being given     Status: None   Collection Time: 05/14/16  7:06 PM  Result Value Ref Range Status   Specimen Description BLOOD RIGHT HAND  Final   Special Requests IN PEDIATRIC BOTTLE [facility]  Final   Culture NO GROWTH 5 DAYS  Final   Report Status 05/19/2016 FINAL  Final      Radiology Studies: Dg Chest 2 View 05/21/2016   Stable left lower lobe consolidation and associated pleural effusion. Known left lung mass seen on 05/16/2016 CT scan not readily appreciated on current radiograph. Electronically Signed   By: Skipper Cliche M.D.   On: 05/21/2016 07:42   Dg Chest 2 View 05/19/2016  1. Worsening patchy left lung base consolidation, nonspecific, suspicious for worsening pneumonia superimposed on lingular mass as suggested on 05/16/2016 chest CT. 2. Small to moderate left  pleural effusion, increased. 3. Findings are superimposed on hazy peripheral reticular lung opacities, which may indicate underlying interstitial lung disease. 4. Hyperinflated lungs, suggesting COPD. Electronically Signed   By: Ilona Sorrel M.D.   On: 05/19/2016 12:38     Scheduled Meds: . aspirin  81 mg Oral Daily  . carvedilol  3.125 mg Oral BID WC  . clopidogrel  75 mg Oral Q breakfast  . furosemide  20 mg Oral BID  . guaiFENesin  600 mg Oral BID  . heparin subcutaneous  5,000 Units Subcutaneous Q8H  . insulin aspart  0-5 Units Subcutaneous QHS  . insulin aspart  0-9 Units Subcutaneous TID WC  . isosorbide dinitrate  10 mg Oral BID  . polyethylene glycol  17 g Oral Daily  . senna-docusate  1 tablet Oral BID  . simvastatin  20 mg Oral QHS  . tamsulosin  0.4 mg Oral QHS   Continuous Infusions:    LOS: 7 days    Time spent: 25 minutes  Greater than 50% of the time spent on counseling and coordinating the care.   Leisa Lenz, MD [facility] Pager [phone]  If 7PM-7AM, please contact night-coverage [email or web address] Password [email or web address] 05/21/2016, 2:38 PM

## 2016-05-21 NOTE — Progress Notes (Signed)
Inpatient Diabetes Program Recommendations  AACE/ADA: New Consensus Statement on Inpatient Glycemic Control (2015)  Target Ranges:  Prepandial:   less than 140 mg/dL      Peak postprandial:   less than 180 mg/dL (1-2 hours)      Critically ill patients:  140 - 180 mg/dL   Results for BRIGHTEN, ORNDOFF (MRN [id number]) as of 05/21/2016 14:01  Ref. Range 05/20/2016 07:34 05/20/2016 11:22 05/20/2016 16:42 05/20/2016 21:07  Glucose-Capillary Latest Ref Range: 65-99 mg/dL 168 (H) 271 (H) 146 (H) 184 (H)   Results for BURDETTE, FOREHAND (MRN [id number]) as of 05/21/2016 14:01  Ref. Range 05/21/2016 07:52 05/21/2016 11:54  Glucose-Capillary Latest Ref Range: 65-99 mg/dL 167 (H) 336 (H)    Home DM Meds: Metformin 1000 mg bid        Glipizide 2.5 mg daily/ 10 mg QHS  Current Insulin Orders: Novolog Sensitive Correction Scale/ SSI (0-9 units) TID AC + HS      MD- Please consider starting Novolog Meal Coverage while patient's home oral DM medications are on hold-  Novolog 3 units tid with meals     --Will follow patient during hospitalization--  Wyn Quaker RN, MSN, CDE Diabetes Coordinator Inpatient Glycemic Control Team Team Pager: [phone] (8a-5p)

## 2016-05-21 NOTE — Progress Notes (Addendum)
[name], male   DOB: 1930-09-09, 82 y.o.   MRN: [id number]    Subjective:  Dyspnea unchanged overnight. Still feels congested. -2.8L overall, net positive overnight, despite resuming diuretics. Does not appear volume overloaded. Creatinine is starting to improve s/p PCI on 5/18 to the RCA. There is residual disease in the LAD and LCx. 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  CXR shows persistent L base consolidation and small pleural effusion.  Objective:  Filed Vitals:   05/20/16 1702 05/20/16 2100 05/20/16 2308 05/21/16 0420  BP: 130/80 152/69  135/60  Pulse: 80 83 85 88  Temp:  98.2 F (36.8 C)  98.2 F (36.8 C)  TempSrc:  Oral  Oral  Resp:  '20 18 18  '$ Height:      Weight:    176 lb 11.2 oz (80.151 kg)  SpO2:  93% 92% 93%    Intake/Output from previous day:  Intake/Output Summary (Last 24 hours) at 05/21/16 4765 Last data filed at 05/20/16 2200  Gross per 24 hour  Intake    720 ml  Output    400 ml  Net    320 ml    Physical Exam: Affect appropriate Chronically ill white male  HEENT: normal Neck supple with no adenopathy JVP normal no bruits no thyromegaly Lungs decreased BS in L base with egophony, trace rales Heart:  S1/S2 no murmur, no rub, gallop or click PMI normal Abdomen: benighn, BS positve, no tenderness, no AAA no bruit.  No HSM or HJR Distal pulses intact with no bruits No edema Neuro non-focal Skin warm and dry No muscular weakness RFA A no hematoma    Lab Results: Basic Metabolic Panel:  Recent Labs  05/20/16 0357 05/21/16 0439  NA 137 137  K 3.9 4.0  CL 103 105  CO2 22 23  GLUCOSE 159* 162*  BUN 21* 23*  CREATININE 1.99* 1.88*  CALCIUM 8.3* 8.5*  MG 1.8 1.7   CBC:  Recent Labs  05/20/16 0357 05/21/16 0439  WBC 9.8 10.2  NEUTROABS 6.4 6.9  HGB 9.0* 9.4*  HCT 28.2* 29.3*  MCV 97.9 99.7  PLT 193 218   Cardiac Enzymes:  Recent Labs  05/18/16 1714  TROPONINI 0.50*     Imaging: Dg Chest 2 View  05/21/2016  CLINICAL DATA:  Shortness of breath with pleural effusion EXAM: CHEST  2 VIEW COMPARISON:  05/19/2016 FINDINGS: Mild cardiac enlargement with aortic calcifications stable. Vascular pattern normal. Mild background diffuse interstitial prominence stable. Right lung otherwise clear. Consolidation in the left lower lobe with small effusion again identified. IMPRESSION: Stable left lower lobe consolidation and associated pleural effusion. Known left lung mass seen on 05/16/2016 CT scan not readily appreciated on current radiograph. Electronically Signed   By: Skipper Cliche M.D.   On: 05/21/2016 07:42   Dg Chest 2 View  05/19/2016  CLINICAL DATA:  Dyspnea EXAM: CHEST  2 VIEW COMPARISON:  05/14/2016 chest radiograph. FINDINGS: Stable cardiomediastinal silhouette with top-normal heart size. No pneumothorax. Small to moderate left pleural effusion, increased. No right pleural effusion. Patchy left lung base consolidation, increased. No pulmonary edema. Mildly hyperinflated lungs. Hazy peripheral reticular opacities throughout both lungs, unchanged. IMPRESSION: 1. Worsening patchy left lung base consolidation, nonspecific, suspicious for worsening pneumonia superimposed on lingular mass as suggested on 05/16/2016 chest CT. 2. Small to moderate left pleural effusion, increased. 3. Findings are superimposed on hazy peripheral reticular lung opacities, which may indicate underlying interstitial  lung disease. 4. Hyperinflated lungs, suggesting COPD. Electronically Signed   By: Ilona Sorrel M.D.   On: 05/19/2016 12:38    Cardiac Studies:  ECG:  NSR, 1st degree AVB, RBBB, lateral T wave changes    Telemetry:  NSR, 1st degree AVB  RBBB nonspecific ST changes PVC;s 05/21/2016   Echo: EF 30-35% reviewed   Medications:   . aspirin  81 mg Oral Daily  . carvedilol  3.125 mg Oral BID WC  . clopidogrel  75 mg Oral Q breakfast  . furosemide  20 mg Oral BID  . guaiFENesin  600  mg Oral BID  . heparin subcutaneous  5,000 Units Subcutaneous Q8H  . insulin aspart  0-5 Units Subcutaneous QHS  . insulin aspart  0-9 Units Subcutaneous TID WC  . isosorbide dinitrate  10 mg Oral BID  . polyethylene glycol  17 g Oral Daily  . senna-docusate  1 tablet Oral BID  . simvastatin  20 mg Oral QHS  . sodium chloride flush  3 mL Intravenous Q12H  . sodium chloride flush  3 mL Intravenous Q12H  . tamsulosin  0.4 mg Oral QHS        Assessment/Plan:  CHF:  New onset elevated BNP EF 30-35% by echo .On lasix 20 mg BID. Stable left pleural effusion - consider thoracentesis, however, this is described as small in size. May help aeration in this area, however, with severe COPD, higher risk of PTX.  Rx nitrates and hydralazine no ACE   AKI on CKD:  CR 1.88 and improving -renal signed off    CAD:  Post rotoblator of RCA Residual complex circumflex and LAD disease Plan medical Rx due to comorbidities Consider off pump LIMA in future versus PCI to LCX and LAD.  Pulmonary:  Severe COPD previous heavy smoker.  3L [place] CPAP at home continue antibiotics PFT;s pending Given likely diagnosis lung CA probably not a surgical candidate. Interesting that PET scan chest normal end  Of last year but will likely need another one have contacted Dr Lamonte Sakai for outpatient f/u and he has an oncologist. CXR shows persistent consolidation in the left base - was on augmentin, now discontinued.  No new cardiac suggestions at this time. Will sign-off and schedule follow-up with Dr. Johnsie Cancel. Please call with questions.  Pixie Casino, MD, [facility] Attending Cardiologist [place] Chloe Baig 05/21/2016, 8:23 AM

## 2016-05-21 NOTE — Progress Notes (Signed)
Pt reused cpap tonight stating for two nights he hasnt slept due to the mask and machine.  Pt requested the machine be removed.  RT will monitor.

## 2016-05-21 NOTE — Progress Notes (Signed)
CARDIAC REHAB PHASE I   Third attempt to ambulate with pt today. On first attempt, pt requesting time to rest after breakfast, on second attempt pt was asleep. Pt is now with visitor, requesting for cardiac rehab to return at a later time. Will re-attempt this afternoon as schedule permits, if not, will follow up tomorrow.   Lenna Sciara, RN, BSN 05/21/2016 2:18 PM

## 2016-05-21 NOTE — Progress Notes (Signed)
Physical Therapy Treatment Patient Details Name: Alan Ford MRN: [id number] DOB: February 23, 1930 Today's Date: 05/21/2016    History of Present Illness 82 y.o. male with known diabetes, HTN, HLD, CHF, emphysema/COPD, CKD, A. fib, OSA on CPAP, diastolic CHF, presenting with 3 days history of intermittent and substernal chest pain.Pt with NSTEMI, underwent cath 5/16.     PT Comments    Pt is making steady gains with mobility. Pt is hoping to d/c home today. Pt did have desaturation with ambulation and continues to have some decreased balance. Pt is ambulating 300 feet with min guard assist. Pt does drift left and right on occasion. Pt is able to regain balance without assistance, but requires the use of holding onto the rails in the hall to do so. O2 sats ranged from 84%-94% with O2 at 3 liters. Recommend d/c home with supervision with mobility, HHPT follow-up and ordering a RW.  Follow Up Recommendations  Home health PT;Supervision for mobility/OOB     Equipment Recommendations  Rolling walker with 5" wheels    Recommendations for Other Services       Precautions / Restrictions Precautions Precautions: Fall Precaution Comments: monitor O2 Restrictions Weight Bearing Restrictions: No    Mobility  Bed Mobility               General bed mobility comments: NT  Transfers Overall transfer level: Modified independent Equipment used: None Transfers: Sit to/from Stand Sit to Stand: Modified independent (Device/Increase time)            Ambulation/Gait Ambulation/Gait assistance: Min guard Ambulation Distance (Feet): 300 Feet Assistive device: None Gait Pattern/deviations: Step-through pattern;Staggering left;Staggering right Gait velocity: decreased   General Gait Details: pt a little undeady with gait, but able to recover without physical assist. Pt O2 sats dropped to 84% with 3 litersO2. Standing rest break and pursed lip breathing for 3 mins to return to 90%.     Stairs            Wheelchair Mobility    Modified Rankin (Stroke Patients Only)       Balance Overall balance assessment: Needs assistance   Sitting balance-Leahy Scale: Normal     Standing balance support: No upper extremity supported Standing balance-Leahy Scale: Fair                      Cognition Arousal/Alertness: Awake/alert Behavior During Therapy: WFL for tasks assessed/performed Overall Cognitive Status: Within Functional Limits for tasks assessed                      Exercises      General Comments        Pertinent Vitals/Pain Pain Assessment: No/denies pain    Home Living                      Prior Function            PT Goals (current goals can now be found in the care plan section) Progress towards PT goals: Progressing toward goals    Frequency  Min 3X/week    PT Plan Current plan remains appropriate    Co-evaluation             End of Session Equipment Utilized During Treatment: Gait belt;Oxygen Activity Tolerance:  (desaturation) Patient left: in bed;with call bell/phone within reach     Time: 1123-1150 PT Time Calculation (min) (ACUTE ONLY): 27 min  Charges:  $Gait Training: 23-37  mins                    G Codes:      Lelon Mast 05/21/2016, 11:51 AM

## 2016-05-21 NOTE — Progress Notes (Signed)
Occupational Therapy Treatment Patient Details Name: Alan Ford MRN: [id number] DOB: 1930/08/26 Today's Date: 05/21/2016    History of present illness 82 y.o. male with known diabetes, HTN, HLD, CHF, emphysema/COPD, CKD, A. fib, OSA on CPAP, diastolic CHF, presenting with 3 days history of intermittent and substernal chest pain.Pt with NSTEMI, underwent cath 5/16.    OT comments  Focus of session on educating pt in energy conservation strategies. Instructed pt in multiple uses of 3 in 1. Pt able to perform standing ADL and toileting at a supervision level for safety. Pt stabilizing on objects as he walked in room.  Follow Up Recommendations  Home health OT;Supervision/Assistance - 24 hour    Equipment Recommendations  3 in 1 bedside comode    Recommendations for Other Services      Precautions / Restrictions Precautions Precautions: Fall Precaution Comments: monitor O2 Restrictions Weight Bearing Restrictions: No       Mobility Bed Mobility Overal bed mobility: Modified Independent               Transfers Overall transfer level: Modified independent Equipment used: None Transfers: Sit to/from Stand Sit to Stand: Modified independent (Device/Increase time)         General transfer comment: No cues or physical assist needed.  Pt steady w/ transfer.    Balance Overall balance assessment: Needs assistance   Sitting balance-Leahy Scale: Normal     Standing balance support: No upper extremity supported Standing balance-Leahy Scale: Fair                     ADL Overall ADL's : Needs assistance/impaired     Grooming: Wash/dry hands;Standing;Supervision/safety           Upper Body Dressing : Standing;Modified independent Upper Body Dressing Details (indicate cue type and reason): doffed front opening gown     Toilet Transfer: Ambulation;Supervision/safety   Toileting- Clothing Manipulation and Hygiene: Sit to/from stand;Modified independent      Clinical cytogeneticist Details (indicate cue type and reason): pt plans to use resin lawn chair as shower seat, instructed to use 02 in shower, use of 3 in 1 as shower seat and benefits of long handled bath sponge Functional mobility during ADLs: Supervision/safety General ADL Comments: Pt fatigued after walking with PT and having been up in chair much of the morning. Agreeable to shaving with electric razor in sitting and washing hands at sink in preparation for lunch. Educated in energy conservation strategies and gave pt handout to reinforce.      Vision                     Perception     Praxis      Cognition   Behavior During Therapy: WFL for tasks assessed/performed Overall Cognitive Status: Within Functional Limits for tasks assessed                       Extremity/Trunk Assessment               Exercises     Shoulder Instructions       General Comments      Pertinent Vitals/ Pain       Pain Assessment: No/denies pain  Home Living  Prior Functioning/Environment              Frequency Min 2X/week     Progress Toward Goals  OT Goals(current goals can now be found in the care plan section)  Progress towards OT goals: Progressing toward goals  Acute Rehab OT Goals Patient Stated Goal: return home OT Goal Formulation: With patient Time For Goal Achievement: 06/01/16 Potential to Achieve Goals: Good  Plan Discharge plan remains appropriate    Co-evaluation                 End of Session Equipment Utilized During Treatment: Oxygen   Activity Tolerance Patient limited by fatigue   Patient Left in bed;with call bell/phone within reach   Nurse Communication          Time: 7517-0017 OT Time Calculation (min): 18 min  Charges: OT General Charges $OT Visit: 1 Procedure OT Treatments $Self Care/Home Management : 8-22 mins  Malka So 05/21/2016,  12:47 PM  [phone]

## 2016-05-22 ENCOUNTER — Telehealth: Payer: Self-pay | Admitting: Emergency Medicine

## 2016-05-22 DIAGNOSIS — I5041 Acute combined systolic (congestive) and diastolic (congestive) heart failure: Secondary | ICD-10-CM

## 2016-05-22 LAB — GLUCOSE, CAPILLARY
GLUCOSE-CAPILLARY: 178 mg/dL — AB (ref 65–99)
GLUCOSE-CAPILLARY: 330 mg/dL — AB (ref 65–99)

## 2016-05-22 LAB — PROTIME-INR
INR: 1.11 (ref 0.00–1.49)
PROTHROMBIN TIME: 14.5 s (ref 11.6–15.2)

## 2016-05-22 MED ORDER — GLIPIZIDE ER 5 MG PO TB24: 2.5000 mg | ORAL_TABLET | Freq: Every day | ORAL | Status: AC

## 2016-05-22 MED ORDER — ACETAMINOPHEN 325 MG PO TABS: 650.0000 mg | ORAL_TABLET | ORAL | Status: AC | PRN

## 2016-05-22 MED ORDER — ISOSORBIDE DINITRATE 10 MG PO TABS: 10.0000 mg | ORAL_TABLET | Freq: Two times a day (BID) | ORAL | Status: AC

## 2016-05-22 MED ORDER — CLOPIDOGREL BISULFATE 75 MG PO TABS: 75.0000 mg | ORAL_TABLET | Freq: Every day | ORAL | Status: AC

## 2016-05-22 MED ORDER — GUAIFENESIN ER 600 MG PO TB12: 600.0000 mg | ORAL_TABLET | Freq: Two times a day (BID) | ORAL | Status: AC | PRN

## 2016-05-22 NOTE — Progress Notes (Signed)
CARDIAC REHAB PHASE I   PRE:  Rate/Rhythm: 104 ST  BP:  Sitting: 144/65        SaO2: 89 RA  MODE:  Ambulation: 350 ft   POST:  Rate/Rhythm: 109 ST  BP:  Sitting: 144/65         SaO2: 92 4L  Pt ambulated 350 ft on RA>4L O2 (see saturation qualification note by RN), rolling walker, gait belt, assist x2 (RN assisted) for equipment/safety, fairly steady gait, tolerated fair. Pt requiring 4L O2 to maintain sats. Pt stronger today, however, recovers slowly from desaturation with activity. Standing rest x1. Pt fatigued by end of walk. Pt states he has no questions regarding education at this time. Pt to bed per pt request after walk, call bell within reach.    [id number] Lenna Sciara, RN, BSN 05/22/2016 10:54 AM

## 2016-05-22 NOTE — Discharge Summary (Addendum)
Physician Discharge Summary  Alan Ford EXB:[id number] DOB: Mar 01, 1930 DOA: 05/14/2016  PCP: Mathews Argyle, MD  Admit date: 05/14/2016 Discharge date: 05/22/2016  Recommendations for Outpatient Follow-up:  Please follow-up with pulmonary per scheduled appointment. Please note new medication in addition to aspirin and his Plavix Please monitor for any sign of bleeding such as blood in the stool or black tarry stool which could be a sign of bleeding. Please call your primary care physician if you notice any sign of bleeding or if late hours then you may need to come back to ED for evaluation.  Also, would recommend stopping metformin because of renal insufficiency. Those for glipizide was increased from 2.5 mg to 5 mg a day and continue the bedtime dose as previously prescribed. Please check with your primary care physician if metformin can be switched to another medication because of renal insufficiency.  Discharge Diagnoses:  Active Problems:   High blood pressure   High cholesterol   Diabetes mellitus (HCC)   COPD (chronic obstructive pulmonary disease) (HCC)   NSTEMI (non-ST elevated myocardial infarction) (HCC)   CHF (congestive heart failure) (HCC)   CRF (chronic renal failure)   Aortic calcification (HCC)   Coronary artery disease involving native coronary artery of native heart without angina pectoris   Pleural effusion   Dyslipidemia associated with type 2 diabetes mellitus (HCC)   Troponin level elevated    Discharge Condition: stable   Diet recommendation: as tolerated   History of present illness:  82 years old male with past medical history of hypertension, dyslipidemia, diabetes who presented to [facility] ED 5/15 with complaint of chest pain. He was subsequently found to have non-STEMI, status post heparin drip as well as stenting of the RCA on 05/17/2016. Cardiothoracic surgery was consulted on May 16 for severe triple vessel disease but felt that the patient would not  be a candidate for surgery due to concern for lung cancer. Pulmonary was also consulted and recommended outpatient follow-up. Nephrology was consulted for preop clearance for cardiac cath.  Hospital Course:   Assessment & Plan:  Chest pain secondary to non-STEMI / Elevated troponin level  - Status post cardiac cath on 05/15/2016, severe three-vessel disease, - S/P rotational atherectomy and stenting of mid RCA 5/18. - Enrolled in [employer] trial, antiplatelet therapy will be according to the protocol. - Dr. Johnsie Cancel of cardiology recommended to discontinue warfarin and heparin. - Cardiothoracic surgery consulted and due to presence of the lung nodule concerning for cancer on the left lobe; felt he is not candidate for surgery due to concern for lung cancer - Continue aspirin and Plavix  Acute on chronic systolic and diastolic CHF - Compensated - EF 30-35% on 05/14/2016 - Continue Lasix 20 mg twice daily - Continue aspirin and Plavix - Pt not on ACEi/ARBs due to renal insufficiency   Essential hypertension - Continue carvedilol 3.125 mg twice daily and isosorbide dinitrate 10 mg twice daily  Acute COPD exacerbation / smoking history - Continue oxygen support via nasal cannula to keep oxygen saturation above 90% - Chest x-ray 05/21/2016 showed stable left lower lobe consultation and associated pleural effusion, left lung mass seen on CT scan 05/16/2016 and not readily appreciated on this study -Patient will follow-up with pulmonary per scheduled appointment   Type 2 diabetes mellitus without complications without long-term insulin use - May resume glipizide on discharge. Please note we increased glipizide to daytime dose to 5 mg instead of 2.5 and we recommended holding metformin because of renal insufficiency  Dyslipidemia associated with type 2 diabetes mellitus - Continue statin.    DVT prophylaxis: Heparin subQ Code Status: full code  Family Communication: no family at the bedside      Consultants:   Cardiology  Nephrology   Pulmonary  Procedures:   Cardiac cath  Echocardiogram  Antimicrobials:   None   Signed:  Leisa Lenz, MD  [facility] 05/22/2016, 10:15 AM  Pager #: [phone]  Time spent in minutes: less than 30 minutes    Discharge Exam: Filed Vitals:   05/21/16 2045 05/22/16 0515  BP: 148/61 156/76  Pulse: 86 87  Temp: 99.7 F (37.6 C) 98.5 F (36.9 C)  Resp: 20 16   Filed Vitals:   05/21/16 1513 05/21/16 2045 05/22/16 0515 05/22/16 0531  BP: 115/70 148/61 156/76   Pulse: 83 86 87   Temp: 99.2 F (37.3 C) 99.7 F (37.6 C) 98.5 F (36.9 C)   TempSrc: Oral Oral Oral   Resp: '20 20 16   '$ Height:      Weight:    80.377 kg (177 lb 3.2 oz)  SpO2: 93% 92% 93%     General: Pt is alert, follows commands appropriately, not in acute distress Cardiovascular: Regular rate and rhythm, S1/S2 + Respiratory: Diminished breath sounds but no wheezing Abdominal: Soft, non tender, non distended, bowel sounds +, no guarding Extremities: no edema, no cyanosis, pulses palpable bilaterally DP and PT Neuro: Grossly nonfocal  Discharge Instructions  Discharge Instructions    Amb Referral to Cardiac Rehabilitation    Complete by:  As directed   Diagnosis:   NSTEMI Coronary Stents       Call MD for:  difficulty breathing, headache or visual disturbances    Complete by:  As directed      Call MD for:  persistant dizziness or light-headedness    Complete by:  As directed      Call MD for:  persistant nausea and vomiting    Complete by:  As directed      Call MD for:  severe uncontrolled pain    Complete by:  As directed      Diet - low sodium heart healthy    Complete by:  As directed      Discharge instructions    Complete by:  As directed   Please follow-up with pulmonary per scheduled appointment. Please note new medication in addition to aspirin and his Plavix Please monitor for any sign of bleeding such as blood  in the stool or black tarry stool which could be a sign of bleeding. Please call your primary care physician if you notice any sign of bleeding or if late hours then you may need to come back to ED for evaluation.  Also, would recommend stopping metformin because of renal insufficiency. Those for glipizide was increased from 2.5 mg to 5 mg a day and continue the bedtime dose as previously prescribed. Please check with your primary care physician if metformin can be switched to another medication because of renal insufficiency.     Increase activity slowly    Complete by:  As directed             Medication List    STOP taking these medications        cephALEXin 500 MG capsule  Commonly known as:  KEFLEX     metFORMIN 1000 MG tablet  Commonly known as:  GLUCOPHAGE     predniSONE 10 MG tablet  Commonly known as:  DELTASONE  TAKE these medications        acetaminophen 325 MG tablet  Commonly known as:  TYLENOL  Take 2 tablets (650 mg total) by mouth every 4 (four) hours as needed for headache or mild pain.     albuterol 108 (90 Base) MCG/ACT inhaler  Commonly known as:  PROVENTIL HFA;VENTOLIN HFA  Inhale 2 puffs into the lungs every 6 (six) hours as needed for wheezing or shortness of breath.     AMLODIPINE BESYLATE PO  Take 1 tablet by mouth daily.     aspirin EC 81 MG tablet  Take 81 mg by mouth at bedtime.     BAYER CONTOUR TEST test strip  Generic drug:  glucose blood     carvedilol 3.125 MG tablet  Commonly known as:  COREG  Take 3.125 mg by mouth 2 (two) times daily with a meal.     Chromium Picolinate 800 MCG Tabs  Take 2 tablets by mouth daily.     clopidogrel 75 MG tablet  Commonly known as:  PLAVIX  Take 1 tablet (75 mg total) by mouth daily with breakfast.     Co Q-10 100 MG Caps  Take 100 mg by mouth at bedtime.     furosemide 20 MG tablet  Commonly known as:  LASIX  Take 20 mg by mouth 2 (two) times daily.     glipiZIDE 5 MG 24 hr tablet   Commonly known as:  GLUCOTROL XL  Take 1 tablet (5 mg total) by mouth daily.     glipiZIDE 10 MG 24 hr tablet  Commonly known as:  GLUCOTROL XL  Take 10 mg by mouth at bedtime.     guaiFENesin 600 MG 12 hr tablet  Commonly known as:  MUCINEX  Take 1 tablet (600 mg total) by mouth 2 (two) times daily as needed for to loosen phlegm.     HYDROcodone-acetaminophen 5-325 MG tablet  Commonly known as:  NORCO/VICODIN  1 tablet as needed.     isosorbide dinitrate 10 MG tablet  Commonly known as:  ISORDIL  Take 1 tablet (10 mg total) by mouth 2 (two) times daily.     simvastatin 20 MG tablet  Commonly known as:  ZOCOR  Take 20 mg by mouth at bedtime.     tamsulosin 0.4 MG Caps capsule  Commonly known as:  FLOMAX  Take 0.4 mg by mouth at bedtime.     vitamin C 1000 MG tablet  Take 1,000 mg by mouth daily.     zinc gluconate 50 MG tablet  Take 50 mg by mouth at bedtime.           Follow-up Information    Follow up with Collene Gobble., MD.   Specialty:  Pulmonary Disease   Contact information:   [address] [address] [phone]       Follow up with [facility].   Why:  Physical Therapy.   Contact information:   [address] [place] [address] [phone]       Follow up with Mathews Argyle, MD. Schedule an appointment as soon as possible for a visit in 1 week.   Specialty:  Internal Medicine   Why:  Follow up appt after recent hospitalization   Contact information:   [address] [place] [address] [phone]        The results of significant diagnostics from this hospitalization (including imaging, microbiology, ancillary and laboratory) are listed  below for reference.    Significant Diagnostic Studies: Dg Chest 2 View  05/21/2016  CLINICAL DATA:  Shortness of breath with pleural effusion EXAM: CHEST  2 VIEW COMPARISON:  05/19/2016 FINDINGS: Mild cardiac enlargement with aortic  calcifications stable. Vascular pattern normal. Mild background diffuse interstitial prominence stable. Right lung otherwise clear. Consolidation in the left lower lobe with small effusion again identified. IMPRESSION: Stable left lower lobe consolidation and associated pleural effusion. Known left lung mass seen on 05/16/2016 CT scan not readily appreciated on current radiograph. Electronically Signed   By: Skipper Cliche M.D.   On: 05/21/2016 07:42   Dg Chest 2 View  05/19/2016  CLINICAL DATA:  Dyspnea EXAM: CHEST  2 VIEW COMPARISON:  05/14/2016 chest radiograph. FINDINGS: Stable cardiomediastinal silhouette with top-normal heart size. No pneumothorax. Small to moderate left pleural effusion, increased. No right pleural effusion. Patchy left lung base consolidation, increased. No pulmonary edema. Mildly hyperinflated lungs. Hazy peripheral reticular opacities throughout both lungs, unchanged. IMPRESSION: 1. Worsening patchy left lung base consolidation, nonspecific, suspicious for worsening pneumonia superimposed on lingular mass as suggested on 05/16/2016 chest CT. 2. Small to moderate left pleural effusion, increased. 3. Findings are superimposed on hazy peripheral reticular lung opacities, which may indicate underlying interstitial lung disease. 4. Hyperinflated lungs, suggesting COPD. Electronically Signed   By: Ilona Sorrel M.D.   On: 05/19/2016 12:38   Dg Chest 2 View  05/14/2016  CLINICAL DATA:  Shortness of breath, cough/ congestion, substernal chest pain x3 weeks EXAM: CHEST  2 VIEW COMPARISON:  05/09/2016 FINDINGS: Chronic interstitial markings with subpleural reticulation/fibrosis, particularly in the right mid lung and left lower lobe. Appearance is similar when compared to 2015, although superimposed bibasilar infection/pneumonia is not entirely excluded. Trace left pleural effusion. No pneumothorax. The heart is normal in size. Degenerative changes of the visualized thoracolumbar spine.  IMPRESSION: Chronic interstitial lung disease. Superimposed mild bibasilar infection/pneumonia is possible. Trace left pleural effusion. Electronically Signed   By: Julian Hy M.D.   On: 05/14/2016 10:27   Dg Chest 2 View  05/09/2016  CLINICAL DATA:  Chest tightness and congestion with productive cough. Increase shortness of breath and wheezing over the past 2 weeks. History of COPD, interstitial lung disease, and CHF. Discontinued smoking 30 years ago. EXAM: CHEST  2 VIEW COMPARISON:  PA and lateral chest x-ray of June 14, 2014 and CT scan of the chest of June 08, 2015. FINDINGS: The lungs remain hyperinflated. The interstitial markings remain increased bilaterally. These are slightly more conspicuous in the left lower lobe today. The heart is top-normal in size. The pulmonary vascularity is normal. There is no pleural effusion. There is mild tortuosity of the descending thoracic aorta. There is mural calcification throughout the thoracic aorta. There is an old healed midshaft left clavicular fracture. The thoracic vertebral bodies are preserved in height. IMPRESSION: Chronic interstitial changes with superimposed interstitial infiltrate in the left lower lobe. There is no alveolar pneumonia. Followup PA and lateral chest X-ray is recommended in 3-4 weeks following trial of antibiotic therapy to ensure resolution and exclude underlying malignancy. Electronically Signed   By: David  Martinique M.D.   On: 05/09/2016 13:22   Ct Chest Wo Contrast  05/16/2016  ADDENDUM REPORT: 05/16/2016 09:33 ADDENDUM: Study discussed by telephone with Dr. Doyle Askew on 05/16/2016 at 0926 hours. We discussed follow-up CT chest with IV contrast, and failing that - bronchoscopy. Electronically Signed   By: Genevie Ann M.D.   On: 05/16/2016 09:33  05/16/2016  CLINICAL  DATA:  82 year old male with severe coronary artery disease, left ventricular systolic dysfunction with several week progressive symptoms of chest pain, shortness of Breath  and combined systolic/diastolic congestive heart failure. Initial encounter. EXAM: CT CHEST WITHOUT CONTRAST TECHNIQUE: Multidetector CT imaging of the chest was performed following the standard protocol without IV contrast. COMPARISON:  Chest radiographs 05/14/2016 and earlier. High-resolution Chest CT without contrast 06/08/2015. PET-CT 09/27/2015 FINDINGS: Chronic centrilobular emphysema with chronic bibasilar fibrotic changes. Superimposed moderate size layering left pleural effusion (layering to 2-2.5 cm from the lung base to the apex). Superimposed compressive atelectasis along the effusion. However, there is also increased left hilar soft tissue density which is exerting mass effect on the left lingula and lower lobe bronchi (series 3, image 93). This appears to be new since June 2016 and new or increased since September 2016. On soft tissue windows through this area some of the density appears to be hilar vasculature, however, there is definite significant increased in left prevascular and AP window lymph nodes now measuring 15-20 mm short axis individually (9 mm or less in June 2016). There is a superimposed 4 cm lobulated/spiculated opacity in the lingula (series 3, image 118) which is new from prior studies. There is associated adjacent left chest wall pleural thickening (series 2, image 124). Elsewhere there is increased curvilinear opacity along the right major fissure which most resembles atelectasis. There is chronic right apical scarring which is stable. Chronic right middle lobe calcified granuloma. No pericardial effusion. No right pleural effusion. Right peritracheal, right precarinal, and right hilar lymph nodes appear stable since 2016 (including occasional small right hilar calcified nodes. There is severe calcified atherosclerosis of the thoracic aorta. Severe coronary artery calcified plaque also noted. Negative thoracic inlet. No axillary lymphadenopathy. Stable visualized upper abdominal  viscera including both adrenal glands. Chronic posterior left third through seventh rib fractures. No acute or suspicious osseous lesion identified. IMPRESSION: 1. Findings on this noncontrast exam which are highly suspicious of left lingula malignancy with malignant left hilar and prevascular/AP window lymphadenopathy. A follow-up IV contrast enhanced CT chest would be most valuable at this point for further evaluation. 2. Moderate layering left pleural effusion. 3. Underlying chronic lung disease including emphysema and basilar pulmonary fibrosis. 4. Widespread/severe calcified atherosclerosis of the thoracic aorta and coronary arteries. Electronically Signed: By: Genevie Ann M.D. On: 05/16/2016 08:31    Microbiology: Recent Results (from the past 240 hour(s))  MRSA PCR Screening     Status: None   Collection Time: 05/14/16  6:01 PM  Result Value Ref Range Status   MRSA by PCR NEGATIVE NEGATIVE Final  Culture, blood (routine x 2) Call MD if unable to obtain prior to antibiotics being given     Status: None   Collection Time: 05/14/16  6:54 PM  Result Value Ref Range Status   Specimen Description BLOOD RIGHT ANTECUBITAL  Final   Special Requests BOTTLES DRAWN AEROBIC AND ANAEROBIC 5CC  Final   Culture NO GROWTH 5 DAYS  Final   Report Status 05/19/2016 FINAL  Final  Culture, blood (routine x 2) Call MD if unable to obtain prior to antibiotics being given     Status: None   Collection Time: 05/14/16  7:06 PM  Result Value Ref Range Status   Specimen Description BLOOD RIGHT HAND  Final   Special Requests IN PEDIATRIC BOTTLE [facility]  Final   Culture NO GROWTH 5 DAYS  Final   Report Status 05/19/2016 FINAL  Final     Labs:  Basic Metabolic Panel:  Recent Labs Lab 05/17/16 2330 05/18/16 0348 05/19/16 0545 05/20/16 0357 05/21/16 0439  NA 136 139 140 137 137  K 3.6 3.5 3.8 3.9 4.0  CL 105 105 104 103 105  CO2 20* '22 22 22 23  '$ GLUCOSE 242* 217* 165* 159* 162*  BUN 24* 24* 20 21* 23*   CREATININE 1.68* 1.77* 1.74* 1.99* 1.88*  CALCIUM 7.9* 8.2* 8.4* 8.3* 8.5*  MG  --   --   --  1.8 1.7  PHOS 3.1  --   --   --   --    Liver Function Tests:  Recent Labs Lab 05/20/16 0357 05/21/16 0439  AST 23 22  ALT 21 24  ALKPHOS 54 58  BILITOT 0.7 0.6  PROT 5.4* 5.7*  ALBUMIN 2.2* 2.2*   No results for input(s): LIPASE, AMYLASE in the last 168 hours. No results for input(s): AMMONIA in the last 168 hours. CBC:  Recent Labs Lab 05/17/16 0407 05/18/16 0348 05/19/16 0545 05/20/16 0357 05/21/16 0439  WBC 9.8 11.0* 8.8 9.8 10.2  NEUTROABS  --   --   --  6.4 6.9  HGB 10.4* 9.5* 9.4* 9.0* 9.4*  HCT 32.2* 29.5* 29.1* 28.2* 29.3*  MCV 98.5 97.4 98.0 97.9 99.7  PLT 209 188 196 193 218   Cardiac Enzymes:  Recent Labs Lab 05/17/16 1510 05/18/16 1714  TROPONINI 0.28* 0.50*   BNP: BNP (last 3 results) No results for input(s): BNP in the last 8760 hours.  ProBNP (last 3 results)  Recent Labs  05/14/16 1012  PROBNP 1454.0*    CBG:  Recent Labs Lab 05/21/16 0752 05/21/16 1154 05/21/16 1709 05/21/16 2136 05/22/16 0729  GLUCAP 167* 336* 99 197* 178*

## 2016-05-22 NOTE — Progress Notes (Signed)
SATURATION QUALIFICATIONS: (This note is used to comply with regulatory documentation for home oxygen)  Patient Saturations on Room Air at Rest = 88%  Patient Saturations on Room Air while Ambulating = 84%  Patient Saturations on 4 Liters of oxygen while Ambulating = 92%  Please briefly explain why patient needs home oxygen: When put on 2 L saturation patient did not improve while ambulating. His normal 3 L brought up saturation to 86%. 4L while ambulating brought saturation to 90-92%.

## 2016-05-22 NOTE — Telephone Encounter (Signed)
Spoke with pt, states that when he was recently hospitalized he had 02 added to cpap qhs and slept better with it.  Pt is wanting to add this to his home cpap, but needs an order to do so.  I do not see an ONO on file.    Pt uses [facility].    RB please advise if you're ok with ordering this.  Thanks!

## 2016-05-22 NOTE — Care Management Important Message (Signed)
Important Message  Patient Details  Name: Alan Ford MRN: [id number] Date of Birth: 24-Mar-1930   Medicare Important Message Given:  Yes    [name] 05/22/2016, 10:33 AM

## 2016-05-22 NOTE — Discharge Instructions (Signed)
Clopidogrel tablets What is this medicine? CLOPIDOGREL (kloh PID oh grel) helps to prevent blood clots. This medicine is used to prevent heart attack, stroke, or other vascular events in people who are at high risk. This medicine may be used for other purposes; ask your health care provider or pharmacist if you have questions. What should I tell my health care provider before I take this medicine? They need to know if you have any of the following conditions: -bleeding disorder -bleeding in the brain -planned surgery -stomach or intestinal ulcers -stroke or transient ischemic attack -an unusual or allergic reaction to clopidogrel, other medicines, foods, dyes, or preservatives -pregnant or trying to get pregnant -breast-feeding How should I use this medicine? Take this medicine by mouth with a drink of water. Follow the directions on the prescription label. You may take this medicine with or without food. Take your medicine at regular intervals. Do not take your medicine more often than directed. Talk to your pediatrician regarding the use of this medicine in children. Special care may be needed. Overdosage: If you think you have taken too much of this medicine contact a poison control center or emergency room at once. NOTE: This medicine is only for you. Do not share this medicine with others. What if I miss a dose? If you miss a dose, take it as soon as you can. If it is almost time for your next dose, take only that dose. Do not take double or extra doses. What may interact with this medicine? -aspirin -blood thinners like cilostazol, enoxaparin, ticlopidine, and warfarin -certain medicines for depression like citalopram, fluoxetine, and fluvoxamine -certain medicines for fungal infections like ketoconazole, fluconazole, and voriconazole -certain medicines for HIV infection like delavirdine, efavirenz, and etravirine -certain medicines for seizures like felbamate, oxcarbazepine, and  phenytoin -chloramphenicol -fluvastatin -isoniazid, INH -medicines for inflammation like ibuprofen and naproxen -modafinil -nicardipine -over-the counter supplements like echinacea, feverfew, fish oil, garlic, ginger, ginkgo, green tea, horse chestnut -quinine -stomach acid blockers like cimetidine, omeprazole, and esomeprazole -tamoxifen -tolbutamide -topiramate -torsemide This list may not describe all possible interactions. Give your health care provider a list of all the medicines, herbs, non-prescription drugs, or dietary supplements you use. Also tell them if you smoke, drink alcohol, or use illegal drugs. Some items may interact with your medicine. What should I watch for while using this medicine? Visit your doctor or health care professional for regular check ups. Do not stop taking your medicine unless your doctor tells you to. Notify your doctor or health care professional and seek emergency treatment if you develop breathing problems; changes in vision; chest pain; severe, sudden headache; pain, swelling, warmth in the leg; trouble speaking; sudden numbness or weakness of the face, arm or leg. These can be signs that your condition has gotten worse. If you are going to have surgery or dental work, tell your doctor or health care professional that you are taking this medicine. Certain genetic factors may reduce the effect of this medicine. Your doctor may use genetic tests to determine treatment. What side effects may I notice from receiving this medicine? Side effects that you should report to your doctor or health care professional as soon as possible: -allergic reactions like skin rash, itching or hives, swelling of the face, lips, or tongue -breathing problems -changes in vision -fever -signs and symptoms of bleeding such as bloody or black, tarry stools; red or dark-brown urine; spitting up blood or brown material that looks like coffee grounds; red spots on  the skin;  unusual bruising or bleeding from the eye, gums, or nose -sudden weakness -unusual bleeding or bruising Side effects that usually do not require medical attention (report to your doctor or health care professional if they continue or are bothersome): -constipation or diarrhea -headache -pain in back or joints -stomach upset This list may not describe all possible side effects. Call your doctor for medical advice about side effects. You may report side effects to FDA at 1-800-FDA-1088. Where should I keep my medicine? Keep out of the reach of children. Store at room temperature of 59 to 86 degrees F (15 to 30 degrees C). Throw away any unused medicine after the expiration date. NOTE: This sheet is a summary. It may not cover all possible information. If you have questions about this medicine, talk to your doctor, pharmacist, or health care provider.    2016, Elsevier/Gold Standard. (2013-04-14 16:34:37)

## 2016-05-29 ENCOUNTER — Encounter: Payer: Self-pay | Admitting: Emergency Medicine

## 2016-05-29 ENCOUNTER — Ambulatory Visit (INDEPENDENT_AMBULATORY_CARE_PROVIDER_SITE_OTHER): Payer: Medicare Other | Admitting: Emergency Medicine

## 2016-05-29 VITALS — BP 110/60 | HR 94 | Ht 70.5 in | Wt 178.0 lb

## 2016-05-29 DIAGNOSIS — J90 Pleural effusion, not elsewhere classified: Secondary | ICD-10-CM | POA: Diagnosis not present

## 2016-05-29 DIAGNOSIS — G4733 Obstructive sleep apnea (adult) (pediatric): Secondary | ICD-10-CM

## 2016-05-29 DIAGNOSIS — J449 Chronic obstructive pulmonary disease, unspecified: Secondary | ICD-10-CM

## 2016-05-29 DIAGNOSIS — I2510 Atherosclerotic heart disease of native coronary artery without angina pectoris: Secondary | ICD-10-CM

## 2016-05-29 DIAGNOSIS — R918 Other nonspecific abnormal finding of lung field: Secondary | ICD-10-CM | POA: Insufficient documentation

## 2016-05-29 NOTE — Assessment & Plan Note (Signed)
He has chronic hypoxemic respiratory failure since his recent hospitalization. I believe he needs to have oxygen plan into his CPAP device. We will order this through his DME today

## 2016-05-29 NOTE — Assessment & Plan Note (Signed)
Currently on Plavix after his RCA was stented during his recent hospitalization. Like to continue the Plavix, defer any invasive pulmonary testing at this time. Revisit possibly discontinuation of his Plavix in order to facilitate biopsy if our suspicion for lung cancer remains after his next CT chest.

## 2016-05-29 NOTE — Assessment & Plan Note (Signed)
Left pleural effusion and the setting of lingular opacity that is suspicious for malignancy. He does have decreased left-sided breath sounds on exam. I'll continue his current efforts at diuresis. If his dyspnea worsens then he likely needs reimaging and possibly thoracentesis.

## 2016-05-29 NOTE — Patient Instructions (Signed)
Please start Stiolto 2 puffs once a day for 4 weeks to see if you benefit. At that time call her office to let us know if it has helped. If so we will order this to your pharmacy. Please continue oxygen 2-3 L/m at all times. We will send an order to [facility] to have this bled into your CPAP at night.  We will not perform a lung biopsy at this time. Instead we will plan to repeat your CT scan of the chest in August 2017 to look for interval change. Continue Plavix (clopidogrel) as you're currently taking it. Follow with NP in 1 month to assess your improvement  Follow with Dr Lamonte Sakai in August 2017 after your CT scan of the chest to review the results

## 2016-05-29 NOTE — Assessment & Plan Note (Signed)
Rounded opacity in the lingula concerning for possible malignancy. I do not believe he is a surgical candidate due to his multiple medical issues. I do believe he could possibly tolerate a biopsy and targeted therapy particularly SBRT if we do establish a diagnosis of malignancy. At this time I'll plan to repeat his CT scan in 3 months and then assess for possible biopsy if our suspicion for malignancy remains

## 2016-05-29 NOTE — Telephone Encounter (Signed)
Will do!

## 2016-05-29 NOTE — Progress Notes (Signed)
Patient seen in the office today and instructed on use of Stiolto.  Patient expressed understanding and demonstrated technique. 

## 2016-05-29 NOTE — Telephone Encounter (Signed)
Spoke with Corene Cornea at [facility], there is an order in EPIC that needs to be signed for the referral for O2 to be bled into CPAP.  Will send to Dr Lamonte Sakai to make sure that this is signed off on so that [facility] can process the order. Please let triage know when this is done. Thanks.

## 2016-05-29 NOTE — Assessment & Plan Note (Signed)
Currently untreated. He has multifactorial dyspnea and has experienced no real improvement on schedule bronchodilators in the past. Also same a believe that he likely will benefit if he can tolerate. I will try starting him on Stiolto, assess for interval improvement.

## 2016-05-29 NOTE — Telephone Encounter (Signed)
Alan Ford from [facility], he states this order needs be signed in epic, [phone] ext [phone]
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
## 2016-05-30 ENCOUNTER — Telehealth: Payer: Self-pay | Admitting: Cardiovascular Disease

## 2016-05-30 ENCOUNTER — Other Ambulatory Visit: Payer: Self-pay | Admitting: Cardiovascular Disease

## 2016-05-30 MED ORDER — NITROGLYCERIN 0.4 MG SL SUBL: 0.4000 mg | SUBLINGUAL_TABLET | SUBLINGUAL | Status: AC | PRN

## 2016-05-30 MED ORDER — RANOLAZINE ER 500 MG PO TB12: 500.0000 mg | ORAL_TABLET | Freq: Two times a day (BID) | ORAL | Status: AC

## 2016-05-30 NOTE — Telephone Encounter (Signed)
Reviewed with Dr. Johnsie Cancel. Pt should start Ranexa 500 mg by mouth twice daily and use NTG as needed. Schedule office visit with Dr. Johnsie Cancel on 6/2 at 4:00. I spoke with [name] and gave her this information. I placed call to pt and left message to call back. Prescriptions sent to [facility] on [address].

## 2016-05-30 NOTE — Telephone Encounter (Signed)
New message     Pt c/o Shortness Of Breath: STAT if SOB developed within the last 24 hours or pt is noticeably SOB on the phone  1. Are you currently SOB (can you hear that pt is SOB on the phone)? Per home health nurse pt states the pt was sob but was not SOB while the nurse was there on the visit  2. How long have you been experiencing SOB?per home health nurses At night when the pt is trying to lay down. Home health nurse advised the pt to prop up 2 pillows to elevate  3. Are you SOB when sitting or when up moving around? Per home health nurse the pt was sitting down  4. Are you currently experiencing any other symptoms? Per home health nurse Weight gain and pressure from chest to abdomen, the home health nurse stated the pt does not have a prescription for nitrogylcerin   Pt c/o of Chest Pain: STAT if CP now or developed within 24 hours  1. Are you having CP right now?  Per home health nurse the pt states it's continous  2. Are you experiencing any other symptoms (ex. SOB, nausea, vomiting, sweating)? Per home health nurse no SOB while the nurse was there for the visit  3. How long have you been experiencing CP? Per home health nurse since may 23 rd on going  4. Is your CP continuous or coming and going? Continuous on going  5. Have you taken Nitroglycerin? The pt does not have a prescription for Nitroglycerin ?

## 2016-05-30 NOTE — Telephone Encounter (Signed)
I tried again to reach pt and left message to call back. I also placed call to number listed for his wife and left message to call back. I spoke with [name] and she will continue to try to reach pt. I told Megan prescriptions had been sent to [facility] and that we would have samples of Ranexa in office for him on Friday. I gave [name] appt time for pt on Friday.

## 2016-05-30 NOTE — Telephone Encounter (Signed)
Patient/pharmacy requesting a quantity of #450 on nitro. Ok to refill for this quantity? Please advise. Thanks, MI

## 2016-05-30 NOTE — Telephone Encounter (Signed)
Spoke with pt's home health nurse--Megan.  She is seeing pt for the first time today.  Pt was discharged on 5/18. During hospitalization had atherectomy and stent placement.  Needs further cardiac intervention in the future.   Pt was seen yesterday by pulmonary. Pt did discuss shortness of breath with pulmonary yesterday.  [name] reports pt told her shortness of breath last night was worse than usual.  He has 2 lb weight gain since yesterday. 3+ pitting edema in lower extremities. Pt's family reports this is better than it has been. Pt has also been having daily chest pain/pressure. He describes this as anginal pain. Goes away on it's own.  Does not have NTG. Last episode was earlier today. He also has constant chest and abdominal pressure. He had this when [name] was with him.  This pressure is different than the chest pain he had earlier today and has been having daily. He was short of breath when standing to weigh today. [name] is going to see pt again on Friday. Pt reports his cardiologist is Dr. Johnsie Cancel.

## 2016-05-31 NOTE — Telephone Encounter (Signed)
Patient is aware of his appointment for tomorrow.

## 2016-06-01 ENCOUNTER — Telehealth: Payer: Self-pay | Admitting: Cardiovascular Disease

## 2016-06-01 ENCOUNTER — Ambulatory Visit (INDEPENDENT_AMBULATORY_CARE_PROVIDER_SITE_OTHER): Payer: Medicare Other | Admitting: Cardiovascular Disease

## 2016-06-01 ENCOUNTER — Encounter: Payer: Self-pay | Admitting: Cardiovascular Disease

## 2016-06-01 VITALS — BP 110/50 | HR 87 | Ht 70.5 in | Wt 176.8 lb

## 2016-06-01 DIAGNOSIS — I471 Supraventricular tachycardia: Secondary | ICD-10-CM | POA: Diagnosis not present

## 2016-06-01 DIAGNOSIS — Z7189 Other specified counseling: Secondary | ICD-10-CM | POA: Diagnosis not present

## 2016-06-01 DIAGNOSIS — Z7689 Persons encountering health services in other specified circumstances: Secondary | ICD-10-CM

## 2016-06-01 NOTE — Telephone Encounter (Signed)
New Message:   Pt has a 4 o'clock appt today. She wants you to be aware that pt had a a fall today.

## 2016-06-01 NOTE — Telephone Encounter (Signed)
Called Alan Ford with [facility] back. She stated patient is still planning on coming to his appointment today, and that he did not have any injuries from his fall.

## 2016-06-01 NOTE — Patient Instructions (Addendum)
Medication Instructions:  Your physician recommends that you continue on your current medications as directed. Please refer to the Current Medication list given to you today.  Labwork: NONE  Testing/Procedures: NONE  Follow-Up: Your physician wants you to follow-up in: next available with Dr. Johnsie Cancel.    If you need a refill on your cardiac medications before your next appointment, please call your pharmacy.

## 2016-06-01 NOTE — Progress Notes (Signed)
[name], male   DOB: Feb 25, 1930, 82 y.o.   MRN: [id number]     Cardiology Office Note   Date:  06/01/2016   ID:  Alan Ford, DOB Dec 16, 1930, MRN [id number]  PCP:  Mathews Argyle, MD  Cardiologist:   Jenkins Rouge, MD   Chief Complaint  Patient presents with  . Establish Care    pt has SOB, f/u from hospital visit and stent placement.       History of Present Illness: Alan Ford is an 82 y.o. male with PMHx of AAA, chronic HFpEF, COPD, T2DM, HTN, MGUS, and OSA who was seen in ED 5/17  ED with complaint of chest pain. He has been Rx for pneumonia by our pulmonary department . Seen by Carolynn Serve on two occasions. Previous heavy smoker with COPD on 3L oxygen at home Also wears CPAP. CRF with MGUS and Cr around 2.5. Had normal myovue 2015 with Dr Marlou Porch. Does have vascular disease with stable AAA around 4.1 cm followed by Dr Donnetta Hutching.  Long hospital course Diagnosed with new onset CHF with echo showing EF 30-35%.  Diagnostic cath showed severe 3VD. Reviewed cath films  and discussed at length with Dr Tamala Julian and Dr Roxy Manns. Culprit lesion iwas  RCA. More diffuse LAD disease and long bifurcation disease in OM. 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 Otherwise would Rx medically and see how he does after RCA intervention  Unfortunately CT showed likely new diagnosis of lung cancer Seen by Dr Lamonte Sakai who plans on possible thoracentesis of left effusion vs repeat CT scan in 3 months with possible biopsy and XRT  IMPRESSION: 1. Findings on this noncontrast exam which are highly suspicious of left lingula malignancy with malignant left hilar and prevascular/AP window lymphadenopathy. A follow-up IV contrast enhanced CT chest would be most valuable at this point for further evaluation. 2. Moderate layering left pleural  effusion. 3. Underlying chronic lung disease including emphysema and basilar pulmonary fibrosis. 4. Widespread/severe calcified atherosclerosis of the thoracic aorta and coronary arteries.  He and his wife are scared. They are concerned that his breathing is not getting better. He clearly has an enlarging left effusion that will likely need a  Rx/Dx tap.  Outside of one day he has not had a lot of chest pain and has been started on ranexa in addition to nitrates and beta blocker His functional status is clearly declining rapidly and I suspect he will be hospice care in a matter of months  Tried to address some of these issues but I am not sure they grasp the extent of his illnesses.  Focusing on his heart I think Medical Rx and not high risk intervention is clearly indicated at this point  Lab Results  Component Value Date   CREATININE 1.88* 05/21/2016   BUN 23* 05/21/2016   NA 137 05/21/2016   K 4.0 05/21/2016   CL 105 05/21/2016   CO2 23 05/21/2016     Past Medical History  Diagnosis Date  . Diabetes mellitus   . High blood pressure   . High cholesterol   . CHF (congestive heart failure) ([place])   . Emphysema   . Joint pain   . AAA (abdominal aortic aneurysm) ([place])   . COPD (chronic obstructive pulmonary disease) ([place])   .  Chronic kidney disease   . Irregular heart beat   . PVC (premature ventricular contraction)   . Shingles   . Chronic renal disease, stage III   . Sleep apnea     cpap  . Irregular heart beat   . CAD (coronary artery disease)   . Chronic diastolic heart failure ([place]) 07/24/2011  . Atrial fibrillation ([place])   . Aortic calcification ([facility]) 05/16/2016    Involving ascending and descending thoracic aorta    Past Surgical History  Procedure Laterality Date  . Penile prosthesis implant  09-2005  . Tonsillectomy    . Urethrotomy    . Cardiac catheterization N/A 05/15/2016    Procedure: Right/Left Heart Cath and Coronary Angiography;  Surgeon: Belva Crome, MD;  Location: [facility];  Service: Cardiovascular;  Laterality: N/A;  . Cardiac catheterization N/A 05/17/2016    Procedure: Coronary/Graft Atherectomy;  Surgeon: Belva Crome, MD;  Location: [facility];  Service: Cardiovascular;  Laterality: N/A;     Current Outpatient Prescriptions  Medication Sig Dispense Refill  . acetaminophen (TYLENOL) 325 MG tablet Take 2 tablets (650 mg total) by mouth every 4 (four) hours as needed for headache or mild pain. 30 tablet 0  . albuterol (PROVENTIL HFA;VENTOLIN HFA) 108 (90 Base) MCG/ACT inhaler Inhale 2 puffs into the lungs every 6 (six) hours as needed for wheezing or shortness of breath. 1 Inhaler 2  . AMLODIPINE BESYLATE PO Take 1 tablet by mouth daily.     . Ascorbic Acid (VITAMIN C) 1000 MG tablet Take 1,000 mg by mouth daily.    Marland Kitchen aspirin EC 81 MG tablet Take 81 mg by mouth at bedtime.     . carvedilol (COREG) 3.125 MG tablet Take 3.125 mg by mouth 2 (two) times daily with a meal.    . Chromium Picolinate 800 MCG TABS Take 2 tablets by mouth daily.      . clopidogrel (PLAVIX) 75 MG tablet Take 1 tablet (75 mg total) by mouth daily with breakfast. 30 tablet 1  . Coenzyme Q10 (CO Q-10) 100 MG CAPS Take 100 mg by mouth at bedtime.     . furosemide (LASIX) 20 MG tablet Take 20 mg by mouth 2 (two) times daily.    Marland Kitchen glipiZIDE (GLUCOTROL XL) 10 MG 24 hr tablet Take 10 mg by mouth at bedtime.    Marland Kitchen glipiZIDE (GLUCOTROL XL) 5 MG 24 hr tablet Take 1 tablet (5 mg total) by mouth daily. 30 tablet 0  . guaiFENesin (MUCINEX) 600 MG 12 hr tablet Take 1 tablet (600 mg total) by mouth 2 (two) times daily as needed for to loosen phlegm. 45 tablet 0  . HYDROcodone-acetaminophen (NORCO/VICODIN) 5-325 MG per tablet Take 1 tablet by mouth daily as needed for moderate pain or severe pain.     . isosorbide dinitrate (ISORDIL) 10 MG tablet Take 1 tablet (10 mg total) by mouth 2 (two) times daily. 60 tablet 1  . nitroGLYCERIN (NITROSTAT) 0.4 MG SL  tablet Place 1 tablet (0.4 mg total) under the tongue every 5 (five) minutes as needed for chest pain. 25 tablet 6  . ranolazine (RANEXA) 500 MG 12 hr tablet Take 1 tablet (500 mg total) by mouth 2 (two) times daily. 60 tablet 6  . simvastatin (ZOCOR) 20 MG tablet Take 20 mg by mouth at bedtime.     . Tamsulosin HCl (FLOMAX) 0.4 MG CAPS Take 0.4 mg by mouth at bedtime.     Marland Kitchen zinc  gluconate 50 MG tablet Take 50 mg by mouth at bedtime.      No current facility-administered medications for this visit.    Allergies:   Review of patient's allergies indicates no known allergies.    Social History:  The patient  reports that he quit smoking about 30 years ago. His smoking use included Cigarettes. He has a 90 pack-year smoking history. He has quit using smokeless tobacco. His smokeless tobacco use included Chew. He reports that he does not drink alcohol or use illicit drugs.   Family History:  The patient's family history includes Cancer in his daughter; Deep vein thrombosis in his mother; Diabetes in his brother, mother, and sister; Heart attack in his father and mother; Heart disease in his brother, father, mother, and sister; Hyperlipidemia in his brother, father, mother, and sister; Hypertension in his brother, father, mother, and sister; Other in his father.    ROS:  Please see the history of present illness.   Otherwise, review of systems are positive for none.   All other systems are reviewed and negative.    PHYSICAL EXAM: VS:  BP 110/50 mmHg  Pulse 87  Ht 5' 10.5" (1.791 m)  Wt 80.196 kg (176 lb 12.8 oz)  BMI 25.00 kg/m2  SpO2 90% , BMI Body mass index is 25 kg/(m^2). Affect appropriate Chronically ill male on oxgyen HEENT: normal Neck supple with no adenopathy JVP normal no bruits no thyromegaly Lungs left pleural effusion no wheezing and good diaphragmatic motion Heart:  S1/S2 no murmur, no rub, gallop or click PMI normal Abdomen: benighn, BS positve, no tenderness, no AAA no  bruit.  No HSM or HJR Distal pulses intact with no bruits Trace LE  edema Neuro non-focal Skin warm and dry No muscular weakness    EKG:   SR RBBB PAC nonspecific ST changes    Recent Labs: 05/14/2016: Pro B Natriuretic peptide (BNP) 1454.0* 05/21/2016: ALT 24; BUN 23*; Creatinine, Ser 1.88*; Hemoglobin 9.4*; Magnesium 1.7; Platelets 218; Potassium 4.0; Sodium 137    Lipid Panel    Component Value Date/Time   CHOL  10/07/2009 0525    100        ATP III CLASSIFICATION:  <200     mg/dL   Desirable  200-239  mg/dL   Borderline High  >=240    mg/dL   High          TRIG 114 10/07/2009 0525   HDL 35* 10/07/2009 0525   CHOLHDL 2.9 10/07/2009 0525   VLDL 23 10/07/2009 0525   LDLCALC  10/07/2009 0525    42        Total Cholesterol/HDL:CHD Risk Coronary Heart Disease Risk Table                     Men   Women  1/2 Average Risk   3.4   3.3  Average Risk       5.0   4.4  2 X Average Risk   9.6   7.1  3 X Average Risk  23.4   11.0        Use the calculated Patient Ratio above and the CHD Risk Table to determine the patient's CHD Risk.        ATP III CLASSIFICATION (LDL):  <100     mg/dL   Optimal  100-129  mg/dL   Near or Above  Optimal  130-159  mg/dL   Borderline  160-189  mg/dL   High  >190     mg/dL   Very High      Wt Readings from Last 3 Encounters:  06/01/16 80.196 kg (176 lb 12.8 oz)  05/29/16 80.74 kg (178 lb)  05/22/16 80.377 kg (177 lb 3.2 oz)      Other studies Reviewed: Additional studies/ records that were reviewed today include: Pulmonary notes recent hospitalization with echo , cath , CT scan.    ASSESSMENT AND PLAN:  1. CAD:  Stable on medical Rx post RCA atherectomy with residual complex LAD and bifurcation circumflex dx.  Continue DAT, nitrates and ranexa 2. CHF:  Compensated on current dose of lasix. Hard to increase due to MGUS and CRF  3. Pulmonary: This is his biggest symptomatic issue. F/U Tammy Parrett and Dr Lamonte Sakai.  Suspect he will need CXR with LL decubitus in next couple Weeks and left throacentesis. Fluid 1/2 way up on left by exam.  Planned to have f/u CT in August per pulmonary.  Continue new inhaler.  4. CRF:  Baseline Cr around 2 responded to hydration in hospital with no decline with contrast for cath. Releated to DM and MGUS 5. Advanced directive :  Will likely need to discuss with pulmonary and primary soon possible hospice / palliative care. Laid some ground work today 6. AAA:  4.1 cm observe for now BP well controlled   Current medicines are reviewed at length with the patient today.  The patient does not have concerns regarding medicines.  The following changes have been made:  Ranexa 500 bid   Labs/ tests ordered today include: none   No orders of the defined types were placed in this encounter.     Disposition:   FU with me next available      Signed, Jenkins Rouge, MD  06/01/2016 4:40 PM    [facility] [place], [place], [place]  [address] Phone: [phone]; Fax: [phone]

## 2016-06-04 ENCOUNTER — Telehealth: Payer: Self-pay | Admitting: Cardiovascular Disease

## 2016-06-04 ENCOUNTER — Telehealth: Payer: Self-pay | Admitting: Emergency Medicine

## 2016-06-04 NOTE — Telephone Encounter (Signed)
Left message to call back  

## 2016-06-04 NOTE — Telephone Encounter (Signed)
Follow-up    The advance home nurse is returning [facility] phone call.

## 2016-06-04 NOTE — Telephone Encounter (Signed)
Spoke with pt's wife. She is aware of the information below. They would rather go ahead to the ED to be evaluated. Pt's wife is going to call 911 to have the pt transported. I called and spoke with [name] at [facility]. She is also aware of this information. Nothing further was needed.

## 2016-06-04 NOTE — Telephone Encounter (Signed)
Will send to doc of the day as RB is scheduled off this afternoon.

## 2016-06-04 NOTE — Telephone Encounter (Signed)
Let's have him seen in our office ASAP, tomorrow ideally. If he is worsening significantly then he needs to be evaluated in the local E.D.

## 2016-06-04 NOTE — Telephone Encounter (Signed)
New message      Calling to see if Dr Johnsie Cancel is going to contact Dr Lamonte Sakai regarding fluid in patient's lung?

## 2016-06-04 NOTE — Telephone Encounter (Signed)
Spoke with Megan at [facility]. States that the pt's health has declined since his last OV with RB. She listened to the pt's lungs today, she heard no breath sounds in the base of his left lung. Pt's oxygen levels are falling into the 80's with minimal exertion. They do return to 68 and above with rest. [name] does not think that the pt can wait until August to have a CT.  RB - please advise. Thanks.

## 2016-06-04 NOTE — Telephone Encounter (Signed)
Follow-up     The home health nurse returning the nurses call.

## 2016-06-04 NOTE — Telephone Encounter (Signed)
lmtcb x1 for Alan Ford with [facility].

## 2016-06-05 ENCOUNTER — Emergency Department (HOSPITAL_COMMUNITY): Payer: Medicare Other

## 2016-06-05 ENCOUNTER — Inpatient Hospital Stay (HOSPITAL_COMMUNITY)
Admission: EM | Admit: 2016-06-05 | Discharge: 2016-06-30 | DRG: 180 | Disposition: E | Payer: Medicare Other | Attending: Family Medicine | Admitting: Family Medicine

## 2016-06-05 ENCOUNTER — Telehealth: Payer: Self-pay | Admitting: Emergency Medicine

## 2016-06-05 ENCOUNTER — Encounter (HOSPITAL_COMMUNITY): Payer: Self-pay | Admitting: Emergency Medicine

## 2016-06-05 DIAGNOSIS — I252 Old myocardial infarction: Secondary | ICD-10-CM | POA: Diagnosis not present

## 2016-06-05 DIAGNOSIS — I5032 Chronic diastolic (congestive) heart failure: Secondary | ICD-10-CM | POA: Diagnosis present

## 2016-06-05 DIAGNOSIS — J910 Malignant pleural effusion: Secondary | ICD-10-CM | POA: Diagnosis present

## 2016-06-05 DIAGNOSIS — I5041 Acute combined systolic (congestive) and diastolic (congestive) heart failure: Secondary | ICD-10-CM | POA: Diagnosis not present

## 2016-06-05 DIAGNOSIS — Z87891 Personal history of nicotine dependence: Secondary | ICD-10-CM

## 2016-06-05 DIAGNOSIS — I5042 Chronic combined systolic (congestive) and diastolic (congestive) heart failure: Secondary | ICD-10-CM | POA: Diagnosis present

## 2016-06-05 DIAGNOSIS — C7800 Secondary malignant neoplasm of unspecified lung: Secondary | ICD-10-CM | POA: Insufficient documentation

## 2016-06-05 DIAGNOSIS — Z515 Encounter for palliative care: Secondary | ICD-10-CM | POA: Insufficient documentation

## 2016-06-05 DIAGNOSIS — R0600 Dyspnea, unspecified: Secondary | ICD-10-CM | POA: Diagnosis present

## 2016-06-05 DIAGNOSIS — E871 Hypo-osmolality and hyponatremia: Secondary | ICD-10-CM | POA: Diagnosis present

## 2016-06-05 DIAGNOSIS — Z66 Do not resuscitate: Secondary | ICD-10-CM | POA: Diagnosis present

## 2016-06-05 DIAGNOSIS — N189 Chronic kidney disease, unspecified: Secondary | ICD-10-CM | POA: Diagnosis not present

## 2016-06-05 DIAGNOSIS — Z9181 History of falling: Secondary | ICD-10-CM | POA: Diagnosis not present

## 2016-06-05 DIAGNOSIS — Z7902 Long term (current) use of antithrombotics/antiplatelets: Secondary | ICD-10-CM

## 2016-06-05 DIAGNOSIS — Z7982 Long term (current) use of aspirin: Secondary | ICD-10-CM

## 2016-06-05 DIAGNOSIS — N184 Chronic kidney disease, stage 4 (severe): Secondary | ICD-10-CM | POA: Diagnosis present

## 2016-06-05 DIAGNOSIS — Z833 Family history of diabetes mellitus: Secondary | ICD-10-CM

## 2016-06-05 DIAGNOSIS — C7801 Secondary malignant neoplasm of right lung: Secondary | ICD-10-CM

## 2016-06-05 DIAGNOSIS — J849 Interstitial pulmonary disease, unspecified: Secondary | ICD-10-CM | POA: Diagnosis present

## 2016-06-05 DIAGNOSIS — J449 Chronic obstructive pulmonary disease, unspecified: Secondary | ICD-10-CM | POA: Diagnosis present

## 2016-06-05 DIAGNOSIS — Z9981 Dependence on supplemental oxygen: Secondary | ICD-10-CM

## 2016-06-05 DIAGNOSIS — E875 Hyperkalemia: Secondary | ICD-10-CM | POA: Diagnosis not present

## 2016-06-05 DIAGNOSIS — R0602 Shortness of breath: Secondary | ICD-10-CM | POA: Diagnosis not present

## 2016-06-05 DIAGNOSIS — Z809 Family history of malignant neoplasm, unspecified: Secondary | ICD-10-CM | POA: Diagnosis not present

## 2016-06-05 DIAGNOSIS — J9621 Acute and chronic respiratory failure with hypoxia: Secondary | ICD-10-CM | POA: Diagnosis present

## 2016-06-05 DIAGNOSIS — E46 Unspecified protein-calorie malnutrition: Secondary | ICD-10-CM | POA: Diagnosis present

## 2016-06-05 DIAGNOSIS — I480 Paroxysmal atrial fibrillation: Secondary | ICD-10-CM | POA: Diagnosis present

## 2016-06-05 DIAGNOSIS — Z7984 Long term (current) use of oral hypoglycemic drugs: Secondary | ICD-10-CM

## 2016-06-05 DIAGNOSIS — E1122 Type 2 diabetes mellitus with diabetic chronic kidney disease: Secondary | ICD-10-CM | POA: Diagnosis present

## 2016-06-05 DIAGNOSIS — Z9889 Other specified postprocedural states: Secondary | ICD-10-CM | POA: Insufficient documentation

## 2016-06-05 DIAGNOSIS — R0902 Hypoxemia: Secondary | ICD-10-CM | POA: Diagnosis not present

## 2016-06-05 DIAGNOSIS — C3492 Malignant neoplasm of unspecified part of left bronchus or lung: Secondary | ICD-10-CM | POA: Diagnosis present

## 2016-06-05 DIAGNOSIS — I130 Hypertensive heart and chronic kidney disease with heart failure and stage 1 through stage 4 chronic kidney disease, or unspecified chronic kidney disease: Secondary | ICD-10-CM | POA: Diagnosis present

## 2016-06-05 DIAGNOSIS — I10 Essential (primary) hypertension: Secondary | ICD-10-CM | POA: Diagnosis present

## 2016-06-05 DIAGNOSIS — Z6822 Body mass index (BMI) 22.0-22.9, adult: Secondary | ICD-10-CM | POA: Diagnosis not present

## 2016-06-05 DIAGNOSIS — R410 Disorientation, unspecified: Secondary | ICD-10-CM | POA: Diagnosis not present

## 2016-06-05 DIAGNOSIS — I4891 Unspecified atrial fibrillation: Secondary | ICD-10-CM | POA: Diagnosis present

## 2016-06-05 DIAGNOSIS — R918 Other nonspecific abnormal finding of lung field: Secondary | ICD-10-CM | POA: Diagnosis not present

## 2016-06-05 DIAGNOSIS — N179 Acute kidney failure, unspecified: Secondary | ICD-10-CM | POA: Diagnosis present

## 2016-06-05 DIAGNOSIS — R509 Fever, unspecified: Secondary | ICD-10-CM | POA: Diagnosis present

## 2016-06-05 DIAGNOSIS — J9620 Acute and chronic respiratory failure, unspecified whether with hypoxia or hypercapnia: Secondary | ICD-10-CM | POA: Diagnosis not present

## 2016-06-05 DIAGNOSIS — J90 Pleural effusion, not elsewhere classified: Secondary | ICD-10-CM | POA: Diagnosis not present

## 2016-06-05 DIAGNOSIS — R591 Generalized enlarged lymph nodes: Secondary | ICD-10-CM | POA: Diagnosis not present

## 2016-06-05 DIAGNOSIS — I2510 Atherosclerotic heart disease of native coronary artery without angina pectoris: Secondary | ICD-10-CM | POA: Diagnosis present

## 2016-06-05 DIAGNOSIS — G4733 Obstructive sleep apnea (adult) (pediatric): Secondary | ICD-10-CM | POA: Diagnosis present

## 2016-06-05 DIAGNOSIS — D631 Anemia in chronic kidney disease: Secondary | ICD-10-CM | POA: Diagnosis present

## 2016-06-05 DIAGNOSIS — J948 Other specified pleural conditions: Secondary | ICD-10-CM | POA: Diagnosis not present

## 2016-06-05 DIAGNOSIS — Z8249 Family history of ischemic heart disease and other diseases of the circulatory system: Secondary | ICD-10-CM | POA: Diagnosis not present

## 2016-06-05 DIAGNOSIS — J810 Acute pulmonary edema: Secondary | ICD-10-CM | POA: Diagnosis not present

## 2016-06-05 DIAGNOSIS — I509 Heart failure, unspecified: Secondary | ICD-10-CM

## 2016-06-05 LAB — CBC WITH DIFFERENTIAL/PLATELET
BASOS ABS: 0 10*3/uL (ref 0.0–0.1)
BASOS PCT: 0 %
EOS ABS: 0 10*3/uL (ref 0.0–0.7)
Eosinophils Relative: 0 %
HEMATOCRIT: 28.5 % — AB (ref 39.0–52.0)
HEMOGLOBIN: 9.1 g/dL — AB (ref 13.0–17.0)
Lymphocytes Relative: 15 %
Lymphs Abs: 1.6 10*3/uL (ref 0.7–4.0)
MCH: 30.6 pg (ref 26.0–34.0)
MCHC: 31.9 g/dL (ref 30.0–36.0)
MCV: 96 fL (ref 78.0–100.0)
Monocytes Absolute: 0.9 10*3/uL (ref 0.1–1.0)
Monocytes Relative: 9 %
NEUTROS ABS: 7.9 10*3/uL — AB (ref 1.7–7.7)
NEUTROS PCT: 76 %
Platelets: 369 10*3/uL (ref 150–400)
RBC: 2.97 MIL/uL — AB (ref 4.22–5.81)
RDW: 13.8 % (ref 11.5–15.5)
WBC: 10.4 10*3/uL (ref 4.0–10.5)

## 2016-06-05 LAB — I-STAT ARTERIAL BLOOD GAS, ED
Acid-base deficit: 6 mmol/L — ABNORMAL HIGH (ref 0.0–2.0)
Bicarbonate: 18.7 mEq/L — ABNORMAL LOW (ref 20.0–24.0)
O2 SAT: 96 %
PCO2 ART: 31.4 mmHg — AB (ref 35.0–45.0)
PH ART: 7.383 (ref 7.350–7.450)
Patient temperature: 98.7
TCO2: 20 mmol/L (ref 0–100)
pO2, Arterial: 81 mmHg (ref 80.0–100.0)

## 2016-06-05 LAB — BASIC METABOLIC PANEL
Anion gap: 10 (ref 5–15)
BUN: 55 mg/dL — AB (ref 6–20)
CO2: 20 mmol/L — AB (ref 22–32)
Calcium: 8.6 mg/dL — ABNORMAL LOW (ref 8.9–10.3)
Chloride: 103 mmol/L (ref 101–111)
Creatinine, Ser: 3.07 mg/dL — ABNORMAL HIGH (ref 0.61–1.24)
GFR calc Af Amer: 20 mL/min — ABNORMAL LOW (ref >60)
GFR, EST NON AFRICAN AMERICAN: 17 mL/min — AB (ref >60)
GLUCOSE: 169 mg/dL — AB (ref 65–99)
POTASSIUM: 5.1 mmol/L (ref 3.5–5.1)
Sodium: 133 mmol/L — ABNORMAL LOW (ref 135–145)

## 2016-06-05 LAB — BRAIN NATRIURETIC PEPTIDE: B NATRIURETIC PEPTIDE 5: 322.8 pg/mL — AB (ref 0.0–100.0)

## 2016-06-05 LAB — TROPONIN I: Troponin I: 0.03 ng/mL (ref <0.031)

## 2016-06-05 MED ORDER — ALBUTEROL SULFATE (2.5 MG/3ML) 0.083% IN NEBU
5.0000 mg | INHALATION_SOLUTION | Freq: Once | RESPIRATORY_TRACT | Status: AC
  Administered 2016-06-05: 5 mg via RESPIRATORY_TRACT
  Filled 2016-06-05: qty 6

## 2016-06-05 MED ORDER — DEXTROSE 5 % IV SOLN
1.0000 g | INTRAVENOUS | Status: DC
  Administered 2016-06-06 – 2016-06-10 (×5): 1 g via INTRAVENOUS
  Filled 2016-06-05 (×8): qty 1

## 2016-06-05 MED ORDER — VANCOMYCIN HCL IN DEXTROSE 1-5 GM/200ML-% IV SOLN
1000.0000 mg | INTRAVENOUS | Status: DC
  Filled 2016-06-05: qty 200

## 2016-06-05 MED ORDER — ISOSORBIDE DINITRATE 10 MG PO TABS
10.0000 mg | ORAL_TABLET | Freq: Two times a day (BID) | ORAL | Status: DC
  Administered 2016-06-06 – 2016-06-20 (×30): 10 mg via ORAL
  Filled 2016-06-05 (×33): qty 1

## 2016-06-05 MED ORDER — ASPIRIN EC 81 MG PO TBEC
81.0000 mg | DELAYED_RELEASE_TABLET | Freq: Every day | ORAL | Status: DC
  Administered 2016-06-06 – 2016-06-19 (×14): 81 mg via ORAL
  Filled 2016-06-05 (×15): qty 1

## 2016-06-05 MED ORDER — INSULIN ASPART 100 UNIT/ML ~~LOC~~ SOLN
0.0000 [IU] | SUBCUTANEOUS | Status: DC
  Administered 2016-06-06: 1 [IU] via SUBCUTANEOUS
  Administered 2016-06-06: 2 [IU] via SUBCUTANEOUS

## 2016-06-05 MED ORDER — VANCOMYCIN HCL 10 G IV SOLR
1500.0000 mg | Freq: Once | INTRAVENOUS | Status: DC
  Filled 2016-06-05: qty 1500

## 2016-06-05 MED ORDER — SODIUM CHLORIDE 0.9% FLUSH
3.0000 mL | Freq: Two times a day (BID) | INTRAVENOUS | Status: DC
  Administered 2016-06-06 – 2016-06-19 (×11): 3 mL via INTRAVENOUS

## 2016-06-05 MED ORDER — CLOPIDOGREL BISULFATE 75 MG PO TABS
75.0000 mg | ORAL_TABLET | Freq: Every day | ORAL | Status: DC
  Administered 2016-06-07 – 2016-06-13 (×7): 75 mg via ORAL
  Filled 2016-06-05 (×8): qty 1

## 2016-06-05 MED ORDER — SODIUM CHLORIDE 0.9% FLUSH: 3.0000 mL | INTRAVENOUS | Status: DC | PRN

## 2016-06-05 MED ORDER — SODIUM CHLORIDE 0.9 % IV SOLN: 250.0000 mL | INTRAVENOUS | Status: DC | PRN

## 2016-06-05 MED ORDER — DEXTROSE 5 % IV SOLN
2.0000 g | Freq: Once | INTRAVENOUS | Status: DC
  Filled 2016-06-05: qty 2

## 2016-06-05 MED ORDER — ALBUTEROL SULFATE (2.5 MG/3ML) 0.083% IN NEBU: 2.5000 mg | INHALATION_SOLUTION | RESPIRATORY_TRACT | Status: DC | PRN

## 2016-06-05 MED ORDER — ALBUTEROL SULFATE (2.5 MG/3ML) 0.083% IN NEBU
2.5000 mg | INHALATION_SOLUTION | Freq: Four times a day (QID) | RESPIRATORY_TRACT | Status: DC
  Administered 2016-06-06: 2.5 mg via RESPIRATORY_TRACT
  Filled 2016-06-05 (×2): qty 3

## 2016-06-05 MED ORDER — CARVEDILOL 3.125 MG PO TABS
3.1250 mg | ORAL_TABLET | Freq: Two times a day (BID) | ORAL | Status: DC
  Administered 2016-06-06 – 2016-06-20 (×29): 3.125 mg via ORAL
  Filled 2016-06-05 (×28): qty 1

## 2016-06-05 MED ORDER — SODIUM CHLORIDE 0.9 % IV SOLN
1500.0000 mg | Freq: Once | INTRAVENOUS | Status: AC
  Administered 2016-06-06: 1500 mg via INTRAVENOUS
  Filled 2016-06-05 (×2): qty 1500

## 2016-06-05 MED ORDER — RANOLAZINE ER 500 MG PO TB12
500.0000 mg | ORAL_TABLET | Freq: Two times a day (BID) | ORAL | Status: DC
  Administered 2016-06-06 – 2016-06-20 (×30): 500 mg via ORAL
  Filled 2016-06-05 (×33): qty 1

## 2016-06-05 MED ORDER — IPRATROPIUM BROMIDE 0.02 % IN SOLN
0.5000 mg | Freq: Four times a day (QID) | RESPIRATORY_TRACT | Status: DC
  Administered 2016-06-06: 0.5 mg via RESPIRATORY_TRACT
  Filled 2016-06-05 (×2): qty 2.5

## 2016-06-05 MED ORDER — SODIUM CHLORIDE 0.9% FLUSH
3.0000 mL | Freq: Two times a day (BID) | INTRAVENOUS | Status: DC
  Administered 2016-06-06 – 2016-06-19 (×27): 3 mL via INTRAVENOUS

## 2016-06-05 NOTE — H&P (Signed)
PCP:   Mathews Argyle, MD   Chief Complaint:  Confused, fever  HPI: 82 yo male NSTEMI 2 weeks ago with cath/stent placed in RCA, copd on 3 liters [place] o2 at home, new lung mass uncertain diagnosis, CHF, afib comes in with progressive worsening sob and cough since he was discharged.  Has been coughing which is worse while lying down and is sometimes productive.  He reports worsening orthopnea and PND.  Denies fevers or chills.  Denies any worsening of his peripheral edema.  He has intermittent chest pain for the past 3 weeks.  No hemoptysis.  He had a left pleural effusion and lung mass.  He has seen his cardiologist and pulmonologist in the last couple weeks and it sounds like a thoracentesis was entertained but did not want to be done due To him being on plavix.  Today cxr shows worsening left pleural effusion and worsening hypoxic hypoxia.  Review of Systems:  Positive and negative as per HPI otherwise all other systems are negative  Past Medical History: Past Medical History  Diagnosis Date  . Diabetes mellitus   . High blood pressure   . High cholesterol   . CHF (congestive heart failure) ([place])   . Emphysema   . Joint pain   . AAA (abdominal aortic aneurysm) ([place])   . COPD (chronic obstructive pulmonary disease) ([place])   . Chronic kidney disease   . Irregular heart beat   . PVC (premature ventricular contraction)   . Shingles   . Chronic renal disease, stage III   . Sleep apnea     cpap  . Irregular heart beat   . CAD (coronary artery disease)   . Chronic diastolic heart failure ([place]) 07/24/2011  . Atrial fibrillation ([place])   . Aortic calcification ([facility]) 05/16/2016    Involving ascending and descending thoracic aorta   Past Surgical History  Procedure Laterality Date  . Penile prosthesis implant  09-2005  . Tonsillectomy    . Urethrotomy    . Cardiac catheterization N/A 05/15/2016    Procedure: Right/Left Heart Cath and Coronary Angiography;  Surgeon: Belva Crome,  MD;  Location: [facility];  Service: Cardiovascular;  Laterality: N/A;  . Cardiac catheterization N/A 05/17/2016    Procedure: Coronary/Graft Atherectomy;  Surgeon: Belva Crome, MD;  Location: [facility];  Service: Cardiovascular;  Laterality: N/A;    Medications: Prior to Admission medications   Medication Sig Start Date End Date Taking? Authorizing Provider  Ascorbic Acid (VITAMIN C) 1000 MG tablet Take 1,000 mg by mouth daily.   Yes Historical Provider, MD  aspirin EC 81 MG tablet Take 81 mg by mouth at bedtime.    Yes Historical Provider, MD  carvedilol (COREG) 3.125 MG tablet Take 3.125 mg by mouth 2 (two) times daily with a meal.   Yes Historical Provider, MD  Chromium Picolinate 800 MCG TABS Take 1,600 mcg by mouth daily.    Yes Historical Provider, MD  clopidogrel (PLAVIX) 75 MG tablet Take 1 tablet (75 mg total) by mouth daily with breakfast. 05/22/16  Yes Robbie Lis, MD  Coenzyme Q10 (CO Q-10) 100 MG CAPS Take 100 mg by mouth at bedtime.    Yes Historical Provider, MD  furosemide (LASIX) 20 MG tablet Take 20 mg by mouth 2 (two) times daily.   Yes Historical Provider, MD  glipiZIDE (GLUCOTROL XL) 10 MG 24 hr tablet Take 10 mg by mouth at bedtime.   Yes Historical Provider, MD  glipiZIDE (GLUCOTROL XL) 5 MG 24 hr tablet Take 1 tablet (5 mg total) by mouth daily. Patient taking differently: Take 2.5 mg by mouth daily with breakfast.  05/22/16  Yes Robbie Lis, MD  guaiFENesin (MUCINEX) 600 MG 12 hr tablet Take 1 tablet (600 mg total) by mouth 2 (two) times daily as needed for to loosen phlegm. 05/22/16  Yes Robbie Lis, MD  HYDROcodone-acetaminophen (NORCO/VICODIN) 5-325 MG per tablet Take 1 tablet by mouth daily as needed for moderate pain or severe pain.  10/26/13  Yes Historical Provider, MD  acetaminophen (TYLENOL) 325 MG tablet Take 2 tablets (650 mg total) by mouth every 4 (four) hours as needed for headache or mild pain. 05/22/16   Robbie Lis, MD  albuterol  (PROVENTIL HFA;VENTOLIN HFA) 108 (90 Base) MCG/ACT inhaler Inhale 2 puffs into the lungs every 6 (six) hours as needed for wheezing or shortness of breath. 01/09/16   Collene Gobble, MD  AMLODIPINE BESYLATE PO Take 1 tablet by mouth at bedtime.     Historical Provider, MD  isosorbide dinitrate (ISORDIL) 10 MG tablet Take 1 tablet (10 mg total) by mouth 2 (two) times daily. 05/22/16   Robbie Lis, MD  nitroGLYCERIN (NITROSTAT) 0.4 MG SL tablet Place 1 tablet (0.4 mg total) under the tongue every 5 (five) minutes as needed for chest pain. 05/30/16   Josue Hector, MD  ranolazine (RANEXA) 500 MG 12 hr tablet Take 1 tablet (500 mg total) by mouth 2 (two) times daily. 05/30/16   Josue Hector, MD  simvastatin (ZOCOR) 20 MG tablet Take 20 mg by mouth at bedtime.  10/21/12   Historical Provider, MD  Tamsulosin HCl (FLOMAX) 0.4 MG CAPS Take 0.4 mg by mouth at bedtime.  10/21/12   Historical Provider, MD  zinc gluconate 50 MG tablet Take 50 mg by mouth at bedtime.     Historical Provider, MD    Allergies:   Allergies  Allergen Reactions  . Hydrocodone Other (See Comments)    NIghtmares, hallucinations    Social History:  reports that he quit smoking about 30 years ago. His smoking use included Cigarettes. He has a 90 pack-year smoking history. He has quit using smokeless tobacco. His smokeless tobacco use included Chew. He reports that he does not drink alcohol or use illicit drugs.  Family History: Family History  Problem Relation Age of Onset  . Other Father     AAA  . Heart disease Father     After age 22,   AAA  . Hypertension Father   . Hyperlipidemia Father   . Heart attack Father   . Diabetes Mother     amputation  . Heart disease Mother   . Hypertension Mother   . Hyperlipidemia Mother   . Deep vein thrombosis Mother   . Heart attack Mother   . Diabetes Sister   . Heart disease Sister     Heart Disease before age 34  . Hypertension Sister   . Hyperlipidemia Sister   .  Diabetes Brother   . Heart disease Brother   . Hypertension Brother   . Hyperlipidemia Brother   . Cancer Daughter     Physical Exam: Filed Vitals:   06/01/2016 2000 06/08/2016 2100 06/06/2016 2130 06/03/2016 2205  BP: 143/80 138/80 133/89 146/81  Pulse: 93 89 75 103  Temp:      TempSrc:      Resp: '22 18 19 19  '$ Height:  Weight:      SpO2: 92% 96% 93% 95%   General appearance: alert, cooperative and no distress Head: Normocephalic, without obvious abnormality, atraumatic Eyes: negative Nose: Nares normal. Septum midline. Mucosa normal. No drainage or sinus tenderness. Neck: no JVD and supple, symmetrical, trachea midline Lungs: diminished breath sounds LLL and LUL Heart: regular rate and rhythm, S1, S2 normal, no murmur, click, rub or gallop Abdomen: soft, non-tender; bowel sounds normal; no masses,  no organomegaly Extremities: edema 2+ble Pulses: 2+ and symmetric Skin: Skin color, texture, turgor normal. No rashes or lesions Neurologic: Grossly normal    Labs on Admission:   Recent Labs  06/16/2016 2007  NA 133*  K 5.1  CL 103  CO2 20*  GLUCOSE 169*  BUN 55*  CREATININE 3.07*  CALCIUM 8.6*    Recent Labs  06/03/2016 2007  WBC 10.4  NEUTROABS 7.9*  HGB 9.1*  HCT 28.5*  MCV 96.0  PLT 369    Recent Labs  06/16/2016 2007  TROPONINI 0.03   Radiological Exams on Admission: Dg Chest 2 View  06/01/2016  CLINICAL DATA:  Shortness of breath for several weeks. EXAM: CHEST  2 VIEW COMPARISON:  05/21/2016 FINDINGS: Cardiomediastinal silhouette is normal. Mediastinal contours appear intact. Aortic atherosclerosis is seen. There is no evidence of pneumothorax. There is an enlarging left pleural effusion. Left lung base atelectasis or airspace consolidation cannot be excluded. Osseous structures are without acute abnormality. Soft tissues are grossly normal. IMPRESSION: Enlarging left pleural effusion, with probable associated atelectasis or airspace consolidation the left  lung base. Electronically Signed   By: Fidela Salisbury M.D.   On: 06/29/2016 20:54   Old chart reviewed cxr reviewed left pleural effusion larger than previous  Assessment/Plan  82 yo male with h/o CHF, lung mass, COPD on 3 liters at home comes in with worsening sob and left pleural effusion  Principal Problem:   Acute on chronic respiratory failure with hypoxia ([place])- unclear if this is due to worsening chf or underlying infection.  See below.  Active Problems:   Pleural effusion on left- cover empirically with vanc/cefepime.  Obtain pulm consult in am   Acute on chronic renal failure ([place])- hold lasix overnight   Chronic diastolic heart failure ([place])- with recent NSTEMI, Dr. Radford Pax with the cardiology service has been called and advised if pt still needs cardiology evaluation in the am to call back in the am.   Dyspnea- due to above issues   COPD (chronic obstructive pulmonary disease) ([place])- cont bronchodilators   ILD (interstitial lung disease) ([place])- noted   Essential hypertension- cont norvasc   Coronary artery disease involving native coronary artery of native heart without angina pectoris- cont ranexa   Lung mass- noted   Recent stent RCA - will continue plavix/aspirin , unless cardiology approves to hold for further procedures  Admit to tele.  Full code.    Jomel Whittlesey A 06/18/2016, 10:31 PM

## 2016-06-05 NOTE — ED Notes (Signed)
attemtped to call report x1

## 2016-06-05 NOTE — ED Provider Notes (Signed)
CSN: [id number]     Arrival date & time 06/24/2016  1921 History   First MD Initiated Contact with Patient 06/01/2016 1922     Chief Complaint  Patient presents with  . Shortness of Breath     (Consider location/radiation/quality/duration/timing/severity/associated sxs/prior Treatment) HPI  82 year old male with multiple comorbidities including COPD, chronic kidney disease, CHF, A. fib, and diabetes presents with shortness of breath. He states he's been short of breath since being discharged about 9 days ago. About once per day he will all of a sudden become more short of breath and his oxygen will drop into the 70s. This is while he is on his 3 L of oxygen. Seems to spontaneously improve. He's been having a continued cough. Denies leg swelling. No fevers. He has chest pain when coughing and family states he's had occasional chest pressure. Was recently diagnosed with an MI. Has had a bump up his oxygen to 4 or 5 L due to the increasing dyspnea.  Past Medical History  Diagnosis Date  . Diabetes mellitus   . High blood pressure   . High cholesterol   . CHF (congestive heart failure) ([place])   . Emphysema   . Joint pain   . AAA (abdominal aortic aneurysm) ([place])   . COPD (chronic obstructive pulmonary disease) ([place])   . Chronic kidney disease   . Irregular heart beat   . PVC (premature ventricular contraction)   . Shingles   . Chronic renal disease, stage III   . Sleep apnea     cpap  . Irregular heart beat   . CAD (coronary artery disease)   . Chronic diastolic heart failure ([place]) 07/24/2011  . Atrial fibrillation ([place])   . Aortic calcification (H[facility]) 05/16/2016    Involving ascending and descending thoracic aorta   Past Surgical History  Procedure Laterality Date  . Penile prosthesis implant  09-2005  . Tonsillectomy    . Urethrotomy    . Cardiac catheterization N/A 05/15/2016    Procedure: Right/Left Heart Cath and Coronary Angiography;  Surgeon: Belva Crome, MD;  Location: [facility];  Service: Cardiovascular;  Laterality: N/A;  . Cardiac catheterization N/A 05/17/2016    Procedure: Coronary/Graft Atherectomy;  Surgeon: Belva Crome, MD;  Location: [facility];  Service: Cardiovascular;  Laterality: N/A;   Family History  Problem Relation Age of Onset  . Other Father     AAA  . Heart disease Father     After age 49,   AAA  . Hypertension Father   . Hyperlipidemia Father   . Heart attack Father   . Diabetes Mother     amputation  . Heart disease Mother   . Hypertension Mother   . Hyperlipidemia Mother   . Deep vein thrombosis Mother   . Heart attack Mother   . Diabetes Sister   . Heart disease Sister     Heart Disease before age 38  . Hypertension Sister   . Hyperlipidemia Sister   . Diabetes Brother   . Heart disease Brother   . Hypertension Brother   . Hyperlipidemia Brother   . Cancer Daughter    Social History  Substance Use Topics  . Smoking status: Former Smoker -- 3.00 packs/day for 30 years    Types: Cigarettes    Quit date: 12/31/1985  . Smokeless tobacco: Former Systems developer    Types: Chew     Comment: "little chewing tobacco"  . Alcohol Use: No  Comment: quit in 1985    Review of Systems  Constitutional: Negative for fever.  Respiratory: Positive for cough and shortness of breath.   Cardiovascular: Positive for chest pain. Negative for leg swelling.  All other systems reviewed and are negative.     Allergies  Review of patient's allergies indicates no known allergies.  Home Medications   Prior to Admission medications   Medication Sig Start Date End Date Taking? Authorizing Provider  acetaminophen (TYLENOL) 325 MG tablet Take 2 tablets (650 mg total) by mouth every 4 (four) hours as needed for headache or mild pain. 05/22/16   Robbie Lis, MD  albuterol (PROVENTIL HFA;VENTOLIN HFA) 108 (90 Base) MCG/ACT inhaler Inhale 2 puffs into the lungs every 6 (six) hours as needed for wheezing or shortness of breath.  01/09/16   Collene Gobble, MD  AMLODIPINE BESYLATE PO Take 1 tablet by mouth daily.     Historical Provider, MD  Ascorbic Acid (VITAMIN C) 1000 MG tablet Take 1,000 mg by mouth daily.    Historical Provider, MD  aspirin EC 81 MG tablet Take 81 mg by mouth at bedtime.     Historical Provider, MD  carvedilol (COREG) 3.125 MG tablet Take 3.125 mg by mouth 2 (two) times daily with a meal.    Historical Provider, MD  Chromium Picolinate 800 MCG TABS Take 2 tablets by mouth daily.      Historical Provider, MD  clopidogrel (PLAVIX) 75 MG tablet Take 1 tablet (75 mg total) by mouth daily with breakfast. 05/22/16   Robbie Lis, MD  Coenzyme Q10 (CO Q-10) 100 MG CAPS Take 100 mg by mouth at bedtime.     Historical Provider, MD  furosemide (LASIX) 20 MG tablet Take 20 mg by mouth 2 (two) times daily.    Historical Provider, MD  glipiZIDE (GLUCOTROL XL) 10 MG 24 hr tablet Take 10 mg by mouth at bedtime.    Historical Provider, MD  glipiZIDE (GLUCOTROL XL) 5 MG 24 hr tablet Take 1 tablet (5 mg total) by mouth daily. 05/22/16   Robbie Lis, MD  guaiFENesin (MUCINEX) 600 MG 12 hr tablet Take 1 tablet (600 mg total) by mouth 2 (two) times daily as needed for to loosen phlegm. 05/22/16   Robbie Lis, MD  HYDROcodone-acetaminophen (NORCO/VICODIN) 5-325 MG per tablet Take 1 tablet by mouth daily as needed for moderate pain or severe pain.  10/26/13   Historical Provider, MD  isosorbide dinitrate (ISORDIL) 10 MG tablet Take 1 tablet (10 mg total) by mouth 2 (two) times daily. 05/22/16   Robbie Lis, MD  nitroGLYCERIN (NITROSTAT) 0.4 MG SL tablet Place 1 tablet (0.4 mg total) under the tongue every 5 (five) minutes as needed for chest pain. 05/30/16   Josue Hector, MD  ranolazine (RANEXA) 500 MG 12 hr tablet Take 1 tablet (500 mg total) by mouth 2 (two) times daily. 05/30/16   Josue Hector, MD  simvastatin (ZOCOR) 20 MG tablet Take 20 mg by mouth at bedtime.  10/21/12   Historical Provider, MD  Tamsulosin HCl  (FLOMAX) 0.4 MG CAPS Take 0.4 mg by mouth at bedtime.  10/21/12   Historical Provider, MD  zinc gluconate 50 MG tablet Take 50 mg by mouth at bedtime.     Historical Provider, MD   BP 143/69 mmHg  Pulse 95  Temp(Src) 97.5 F (36.4 C) (Oral)  Resp 22  Ht 5' 9.5" (1.765 m)  Wt 167 lb (75.751 kg)  BMI  24.32 kg/m2  SpO2 94% Physical Exam  Constitutional: He is oriented to person, place, and time. He appears well-developed and well-nourished.  HENT:  Head: Normocephalic and atraumatic.  Right Ear: External ear normal.  Left Ear: External ear normal.  Nose: Nose normal.  Eyes: Right eye exhibits no discharge. Left eye exhibits no discharge.  Neck: Neck supple.  Cardiovascular: Normal rate, regular rhythm, normal heart sounds and intact distal pulses.   Pulmonary/Chest: Effort normal. He has decreased breath sounds in the left lower field. He has no wheezes. He exhibits no tenderness.  Abdominal: Soft. He exhibits no distension. There is no tenderness.  Musculoskeletal: He exhibits edema (bilateral pitting edema to lower legs).  Neurological: He is alert and oriented to person, place, and time.  Skin: Skin is warm and dry. He is not diaphoretic.  Nursing note and vitals reviewed.   ED Course  Procedures (including critical care time) Labs Review Labs Reviewed  BASIC METABOLIC PANEL - Abnormal; Notable for the following:    Sodium 133 (*)    CO2 20 (*)    Glucose, Bld 169 (*)    BUN 55 (*)    Creatinine, Ser 3.07 (*)    Calcium 8.6 (*)    GFR calc non Af Amer 17 (*)    GFR calc Af Amer 20 (*)    All other components within normal limits  CBC WITH DIFFERENTIAL/PLATELET - Abnormal; Notable for the following:    RBC 2.97 (*)    Hemoglobin 9.1 (*)    HCT 28.5 (*)    Neutro Abs 7.9 (*)    All other components within normal limits  BRAIN NATRIURETIC PEPTIDE - Abnormal; Notable for the following:    B Natriuretic Peptide 322.8 (*)    All other components within normal limits   I-STAT ARTERIAL BLOOD GAS, ED - Abnormal; Notable for the following:    pCO2 arterial 31.4 (*)    Bicarbonate 18.7 (*)    Acid-base deficit 6.0 (*)    All other components within normal limits  CULTURE, BLOOD (ROUTINE X 2)  CULTURE, BLOOD (ROUTINE X 2)  TROPONIN I  TROPONIN I  TROPONIN I  TROPONIN I  BASIC METABOLIC PANEL  CBC    Imaging Review Dg Chest 2 View  06/21/2016  CLINICAL DATA:  Shortness of breath for several weeks. EXAM: CHEST  2 VIEW COMPARISON:  05/21/2016 FINDINGS: Cardiomediastinal silhouette is normal. Mediastinal contours appear intact. Aortic atherosclerosis is seen. There is no evidence of pneumothorax. There is an enlarging left pleural effusion. Left lung base atelectasis or airspace consolidation cannot be excluded. Osseous structures are without acute abnormality. Soft tissues are grossly normal. IMPRESSION: Enlarging left pleural effusion, with probable associated atelectasis or airspace consolidation the left lung base. Electronically Signed   By: Fidela Salisbury M.D.   On: 06/09/2016 20:54   I have personally reviewed and evaluated these images and lab results as part of my medical decision-making.   EKG Interpretation   Date/Time:  Tuesday June 05 2016 19:29:25 EDT Ventricular Rate:  93 PR Interval:  180 QRS Duration: 169 QT Interval:  429 QTC Calculation: 534 R Axis:   109 Text Interpretation:  Ectopic atrial rhythm RBBB and LPFB ST depr,  consider ischemia, inferior leads Interpretation significantly limited by  wandering baseline Confirmed by Exander Shaul MD, Diontre Harps [phone]) on 06/15/2016  7:34:00 PM      MDM   Final diagnoses:  Pleural effusion on left  Acute on chronic renal failure (HCC)  Acute on chronic respiratory failure with hypoxia (HCC)    Patient's dyspnea is likely from increased pleural effusion. Will cover with HCAP antibiotics. Also with AKI on CKD. Not currently in distress. Will admit to hospitalist. Consulted Dr. Radford Pax for  cards at hospitalist request given recent MI with stent and currently in a trial therapy.    Sherwood Gambler, MD 06/06/16 0020

## 2016-06-05 NOTE — ED Notes (Signed)
Per [facility]    SOB 45 hrs Home 02   5L due to 70s-90s at home  Wheezing  Simple mask went up to 96-97% Rhonchi sounding Released 9 days   PT HAD A MI RECENTLY within 1 month. 4 ASA given RBB with no elevation or depression 20LAC No N/V

## 2016-06-05 NOTE — Telephone Encounter (Signed)
Spoke with Megan at [facility]. She spoke with pt's wife this morning. Pt's wife told [name] that they did not go the ED yesterday as instructed by our office. Megan wanted to let us know because she wasn't sure if the pt's wife would tell us. Vilinda Boehringer that we have not received any calls from the pt or his wife. She has another appointment with him on Thursday and will call back to update [place] on the pt's condition. Nothing further was needed at this time.

## 2016-06-05 NOTE — Progress Notes (Signed)
Pharmacy Antibiotic Note  Alan Ford is a 82 y.o. male admitted on 06/14/2016 with pneumonia.  Pharmacy has been consulted for vancomycin and cefepime dosing. Pt is afebrile and WBC is WNL. SCr is elevated above baseline at 3.07.   Plan: - Vanc '1500mg'$  IV x 1 then 1gm IV Q48H - Cefepime 2gm IV x 1 then 1gm IV Q24H - F/u renal fxn, C&S, clinical status and trough at SS  Height: 5' 9.5" (176.5 cm) Weight: 167 lb (75.751 kg) IBW/kg (Calculated) : 71.85  Temp (24hrs), Avg:97.5 F (36.4 C), Min:97.5 F (36.4 C), Max:97.5 F (36.4 C)   Recent Labs Lab 06/08/2016 2007  WBC 10.4  CREATININE 3.07*    Estimated Creatinine Clearance: 18.2 mL/min (by C-G formula based on Cr of 3.07).    No Known Allergies  Antimicrobials this admission: Vanc 6/6>> Cefepime 6/6>>  Dose adjustments this admission: N/A  Microbiology results: Pending  Thank you for allowing pharmacy to be a part of this patient's care.  Vyom Brass, Rande Lawman 06/12/2016 9:47 PM

## 2016-06-06 ENCOUNTER — Inpatient Hospital Stay (HOSPITAL_COMMUNITY): Payer: Medicare Other

## 2016-06-06 DIAGNOSIS — J449 Chronic obstructive pulmonary disease, unspecified: Secondary | ICD-10-CM

## 2016-06-06 DIAGNOSIS — R0902 Hypoxemia: Secondary | ICD-10-CM

## 2016-06-06 DIAGNOSIS — J810 Acute pulmonary edema: Secondary | ICD-10-CM

## 2016-06-06 DIAGNOSIS — J90 Pleural effusion, not elsewhere classified: Secondary | ICD-10-CM

## 2016-06-06 DIAGNOSIS — J9621 Acute and chronic respiratory failure with hypoxia: Secondary | ICD-10-CM

## 2016-06-06 LAB — GLUCOSE, CAPILLARY
GLUCOSE-CAPILLARY: 119 mg/dL — AB (ref 65–99)
GLUCOSE-CAPILLARY: 123 mg/dL — AB (ref 65–99)
GLUCOSE-CAPILLARY: 136 mg/dL — AB (ref 65–99)
GLUCOSE-CAPILLARY: 186 mg/dL — AB (ref 65–99)
Glucose-Capillary: 250 mg/dL — ABNORMAL HIGH (ref 65–99)
Glucose-Capillary: 245 mg/dL — ABNORMAL HIGH (ref 65–99)

## 2016-06-06 LAB — PROTEIN, TOTAL: Total Protein: 6.1 g/dL — ABNORMAL LOW (ref 6.5–8.1)

## 2016-06-06 LAB — BASIC METABOLIC PANEL
Anion gap: 15 (ref 5–15)
BUN: 49 mg/dL — AB (ref 6–20)
CALCIUM: 8.4 mg/dL — AB (ref 8.9–10.3)
CO2: 18 mmol/L — ABNORMAL LOW (ref 22–32)
CREATININE: 2.9 mg/dL — AB (ref 0.61–1.24)
Chloride: 103 mmol/L (ref 101–111)
GFR calc Af Amer: 21 mL/min — ABNORMAL LOW (ref >60)
GFR, EST NON AFRICAN AMERICAN: 18 mL/min — AB (ref >60)
GLUCOSE: 133 mg/dL — AB (ref 65–99)
Potassium: 5 mmol/L (ref 3.5–5.1)
SODIUM: 136 mmol/L (ref 135–145)

## 2016-06-06 LAB — CBC
HCT: 28.1 % — ABNORMAL LOW (ref 39.0–52.0)
HEMOGLOBIN: 8.9 g/dL — AB (ref 13.0–17.0)
MCH: 31 pg (ref 26.0–34.0)
MCHC: 31.7 g/dL (ref 30.0–36.0)
MCV: 97.9 fL (ref 78.0–100.0)
PLATELETS: 405 10*3/uL — AB (ref 150–400)
RBC: 2.87 MIL/uL — ABNORMAL LOW (ref 4.22–5.81)
RDW: 14.1 % (ref 11.5–15.5)
WBC: 11 10*3/uL — AB (ref 4.0–10.5)

## 2016-06-06 LAB — BODY FLUID CELL COUNT WITH DIFFERENTIAL
Lymphs, Fluid: 35 %
Monocyte-Macrophage-Serous Fluid: 13 % — ABNORMAL LOW (ref 50–90)
NEUTROPHIL FLUID: 53 % — AB (ref 0–25)
WBC FLUID: 380 uL (ref 0–1000)

## 2016-06-06 LAB — TROPONIN I
TROPONIN I: 0.03 ng/mL (ref <0.031)
TROPONIN I: 0.03 ng/mL (ref <0.031)

## 2016-06-06 LAB — GLUCOSE, SEROUS FLUID: Glucose, Fluid: 90 mg/dL

## 2016-06-06 LAB — GRAM STAIN

## 2016-06-06 LAB — HEMATOCRIT: HEMATOCRIT: 30.6 % — AB (ref 39.0–52.0)

## 2016-06-06 LAB — PROTEIN, BODY FLUID: Total protein, fluid: 3.5 g/dL

## 2016-06-06 LAB — LACTATE DEHYDROGENASE, PLEURAL OR PERITONEAL FLUID: LD, Fluid: 1217 U/L — ABNORMAL HIGH (ref 3–23)

## 2016-06-06 LAB — CHOLESTEROL, TOTAL: Cholesterol: 106 mg/dL (ref 0–200)

## 2016-06-06 LAB — LACTATE DEHYDROGENASE: LDH: 188 U/L (ref 98–192)

## 2016-06-06 MED ORDER — CHLORHEXIDINE GLUCONATE 0.12 % MT SOLN
15.0000 mL | Freq: Two times a day (BID) | OROMUCOSAL | Status: DC
  Administered 2016-06-06 – 2016-06-15 (×19): 15 mL via OROMUCOSAL
  Filled 2016-06-06 (×16): qty 15

## 2016-06-06 MED ORDER — IPRATROPIUM-ALBUTEROL 0.5-2.5 (3) MG/3ML IN SOLN
3.0000 mL | Freq: Four times a day (QID) | RESPIRATORY_TRACT | Status: DC
  Administered 2016-06-06 (×2): 3 mL via RESPIRATORY_TRACT
  Filled 2016-06-06 (×2): qty 3

## 2016-06-06 MED ORDER — CETYLPYRIDINIUM CHLORIDE 0.05 % MT LIQD
7.0000 mL | Freq: Two times a day (BID) | OROMUCOSAL | Status: DC
  Administered 2016-06-07 – 2016-06-13 (×12): 7 mL via OROMUCOSAL

## 2016-06-06 MED ORDER — ACETAMINOPHEN 325 MG PO TABS
650.0000 mg | ORAL_TABLET | Freq: Four times a day (QID) | ORAL | Status: DC | PRN
  Administered 2016-06-06 – 2016-06-17 (×10): 650 mg via ORAL
  Filled 2016-06-06 (×10): qty 2

## 2016-06-06 MED ORDER — DEXTROSE 5 % IV SOLN
2.0000 g | Freq: Once | INTRAVENOUS | Status: AC
  Administered 2016-06-06: 2 g via INTRAVENOUS
  Filled 2016-06-06: qty 2

## 2016-06-06 MED ORDER — INSULIN ASPART 100 UNIT/ML ~~LOC~~ SOLN
0.0000 [IU] | Freq: Three times a day (TID) | SUBCUTANEOUS | Status: DC
  Administered 2016-06-06 (×2): 3 [IU] via SUBCUTANEOUS
  Administered 2016-06-07: 2 [IU] via SUBCUTANEOUS
  Administered 2016-06-07 – 2016-06-08 (×3): 1 [IU] via SUBCUTANEOUS
  Administered 2016-06-08: 3 [IU] via SUBCUTANEOUS
  Administered 2016-06-08: 1 [IU] via SUBCUTANEOUS
  Administered 2016-06-09 – 2016-06-10 (×6): 2 [IU] via SUBCUTANEOUS
  Administered 2016-06-10 – 2016-06-11 (×2): 1 [IU] via SUBCUTANEOUS
  Administered 2016-06-11: 2 [IU] via SUBCUTANEOUS
  Administered 2016-06-11: 7 [IU] via SUBCUTANEOUS
  Administered 2016-06-12: 2 [IU] via SUBCUTANEOUS
  Administered 2016-06-12 (×2): 3 [IU] via SUBCUTANEOUS
  Administered 2016-06-12 – 2016-06-13 (×2): 2 [IU] via SUBCUTANEOUS
  Administered 2016-06-13: 3 [IU] via SUBCUTANEOUS
  Administered 2016-06-13 (×2): 2 [IU] via SUBCUTANEOUS
  Administered 2016-06-14: 5 [IU] via SUBCUTANEOUS
  Administered 2016-06-14: 2 [IU] via SUBCUTANEOUS
  Administered 2016-06-14: 1 [IU] via SUBCUTANEOUS
  Administered 2016-06-15: 3 [IU] via SUBCUTANEOUS
  Administered 2016-06-15 – 2016-06-16 (×2): 1 [IU] via SUBCUTANEOUS
  Administered 2016-06-16: 2 [IU] via SUBCUTANEOUS
  Administered 2016-06-16: 3 [IU] via SUBCUTANEOUS
  Administered 2016-06-16 – 2016-06-17 (×3): 2 [IU] via SUBCUTANEOUS
  Administered 2016-06-17: 3 [IU] via SUBCUTANEOUS
  Administered 2016-06-17: 2 [IU] via SUBCUTANEOUS
  Administered 2016-06-18 (×2): 1 [IU] via SUBCUTANEOUS
  Administered 2016-06-18 – 2016-06-19 (×4): 2 [IU] via SUBCUTANEOUS
  Administered 2016-06-19: 1 [IU] via SUBCUTANEOUS
  Administered 2016-06-19: 3 [IU] via SUBCUTANEOUS
  Administered 2016-06-20: 2 [IU] via SUBCUTANEOUS

## 2016-06-06 NOTE — Progress Notes (Signed)
Transfer patient to 2h18 via bed accompanied by respiratory on bipap, report given, family updated by K. KirbyNP.

## 2016-06-06 NOTE — Telephone Encounter (Signed)
Patient currently admitted to hospital.

## 2016-06-06 NOTE — Progress Notes (Signed)
[facility]  Patient Status: Active (receiving services up to time of hospitalization)  [facility] is providing the following services: RN and PT  If patient discharges after hours, please call [phone].   Alan Ford 06/06/2016, 11:02 AM

## 2016-06-06 NOTE — Procedures (Signed)
Thoracentesis Procedure Note  Pre-operative Diagnosis: let pleural effusion  Post-operative Diagnosis: same-->exudate   Indications: dyspnea and fluid analysis   Procedure Details  Consent: Informed consent was obtained. Risks of the procedure were discussed including: infection, bleeding, pain, pneumothorax.  Under sterile conditions the patient was positioned. Betadine solution and sterile drapes were utilized.  1% buffered lidocaine was used to anesthetize the  rib space which was identified via real time [place]. Fluid was obtained without any difficulties and minimal blood loss.  A dressing was applied to the wound and wound care instructions were provided.   Findings 1500 ml of concentrated rusty pleural fluid was obtained. A sample was sent to Pathology for cytogenetics, flow, and cell counts, as well as for infection analysis.  Complications:  None; patient tolerated the procedure well.          Condition: stable  Plan A follow up chest x-ray was ordered. Bed Rest for 0 hours. Tylenol 650 mg. for pain.   Erick Colace ACNP-BC [place] Pager # [phone] OR # [phone] if no answer  Rush Farmer, M.D. [facility] Pulmonary/Critical Care Medicine. Pager: [phone]. After hours pager: [phone].

## 2016-06-06 NOTE — Progress Notes (Signed)
Lab called informed me that they do not do Rheumatoid factor levels on Pleural fluid. Call made & left message for Elink DR Oletta Darter to inform the PCCM group of this. All other labs ordered  for sample will be run.

## 2016-06-06 NOTE — Progress Notes (Signed)
Patient had episode of respiratory distress  Very labored breathing and tachypneic  just finished using the urinal to void breath sound left side very diminshed. O2 sats  Low 80's  Respiratory was paged and assessed patient, K. KirbyNP was paged and in assessed patient Will transfer patient to stepdown unit. Placed on bipap 40%, awaiting for transfer and bed.

## 2016-06-06 NOTE — Progress Notes (Addendum)
HPI brief: Pt admitted last evening with left pleural effusion, enlarging. Known left pleural effusion as an outpt and was under surveillance by outpt MD. NSTEMI 2 weeks ago with stenting. On Plavix/ASA which was reason for no procedure so far. See full H&P.  Tonight, RN paged because pt was becoming increasingly SOB and dropping O2 sats with slightest activity. Pt was using CPAP tonight as he does at home. RT assessed pt and lung sounds on left with poor air exchange. Noted Left pleural effusion on CXR done on admit. Lasix has been avoided to this point because of elevated creat over 3 (1.88 2 weeks ago).  NP to bedside. S: as above per nsg. Pt says he feels a little better with the CPAP. Limiting activity helps breathing. Even using urinal caused SOB. No CP.  O: Poorly appearing elderly WM in moderate respiratory distress. Not toxic appearing. Alert and oriented. Sats up to 99% on bipap. RT present with bipap. Poor air exchange on left.  A/P: 1. Left pleural effusion-now increasing. Little to no respiratory reserve. Will r/p CXR. Bipap started and will continue. Anticipate pt will need thoracentesis. Low threshold to call pulmonary tonight, if not, needs consult in am.  2. AKI-can not give IVF. Trying to hold off on Lasix which probably wouldn't help much at this point. Creat over 3.  3, Hx NSTEMI s/p stenting 2 weeks ago. On Plavix/ASA, last dose 06/13/2016. Cardiology will need to weigh in on stopping meds for thoracentesis if procedure needed.  TF pt to SDU. Is already NPO in case of procedure. On abx empirically for HCAP.  Called wife with pt's permission to update on status and TF to SDU.  Will follow closely.  Clance Boll, NP [facility] Update: Pt now in SDU. On bipap. Appears very comfortable now. O2 sats normal. CXR showed slightly enlarged Left pleural effusion.

## 2016-06-06 NOTE — Consult Note (Signed)
Name: Alan Ford MRN: [id number] DOB: 1930/11/05    ADMISSION DATE:  06/19/2016 CONSULTATION DATE:  6/7  REFERRING MD :  Wendee Beavers ([facility])   CHIEF COMPLAINT:  Respiratory failure   BRIEF PATIENT DESCRIPTION: 82yo male former smoker with hx DM, chronic dCHF, CKD 3-4, OSA on CPAP, ILD (NSIP) and COPD on 3L home O2 followed by Dr. Lamonte Sakai as well as known L hilar lung mass.  Had recent admit (5/15-5/23) for NSTEMI with cath and RCA stent placed.  He returned 6/6 with c/o progressive dyspnea, orthopnea, cough.  He was admitted by [facility], CXR revealed slight increase in known pleural effusion and worsening pulmonary edema.  He was started on bipap and admitted to SDU where PCCM was consulted to assist with pulmonary needs.   SIGNIFICANT EVENTS    STUDIES:     HISTORY OF PRESENT ILLNESS:  82yo male former smoker with hx DM, chronic dCHF, CKD 3-4, OSA on CPAP, ILD (NSIP) and COPD on 3L home O2 followed by Dr. Lamonte Sakai as well as known L hilar lung mass.  Had recent admit (5/15-5/23) for NSTEMI with cath and RCA stent placed.  He returned 6/6 with c/o progressive dyspnea, orthopnea, cough.  He was admitted by [facility], CXR revealed slight increase in known pleural effusion and worsening pulmonary edema.  He was started on bipap and admitted to SDU where PCCM was consulted to assist with pulmonary needs.   Currently feeling a bit better.  Was just taken off bipap "for a break".  Still SOB but a bit better.  Denies chest pain, hemoptysis, fevers/chills, n/v/d, night sweats.  Does endorse some weight loss over last few weeks after previous hospitalization.   PAST MEDICAL HISTORY :   has a past medical history of Diabetes mellitus; High blood pressure; High cholesterol; CHF (congestive heart failure) ([place]); Emphysema; Joint pain; AAA (abdominal aortic aneurysm) ([place]); COPD (chronic obstructive pulmonary disease) ([place]); Chronic kidney disease; Irregular heart beat; PVC (premature ventricular contraction); Shingles;  Chronic renal disease, stage III; Sleep apnea; Irregular heart beat; CAD (coronary artery disease); Chronic diastolic heart failure ([place]) (07/24/2011); Atrial fibrillation ([place]); and Aortic calcification ([place]) (05/16/2016).  has past surgical history that includes Penile prosthesis implant (09-2005); Tonsillectomy; Urethrotomy; Cardiac catheterization (N/A, 05/15/2016); and Cardiac catheterization (N/A, 05/17/2016). Prior to Admission medications   Medication Sig Start Date End Date Taking? Authorizing Provider  acetaminophen (TYLENOL) 325 MG tablet Take 2 tablets (650 mg total) by mouth every 4 (four) hours as needed for headache or mild pain. 05/22/16  Yes Robbie Lis, MD  albuterol (PROVENTIL HFA;VENTOLIN HFA) 108 (90 Base) MCG/ACT inhaler Inhale 2 puffs into the lungs every 6 (six) hours as needed for wheezing or shortness of breath. 01/09/16  Yes Collene Gobble, MD  AMLODIPINE BESYLATE PO Take 1 tablet by mouth at bedtime.    Yes Historical Provider, MD  Ascorbic Acid (VITAMIN C) 1000 MG tablet Take 1,000 mg by mouth daily.   Yes Historical Provider, MD  aspirin EC 81 MG tablet Take 81 mg by mouth at bedtime.    Yes Historical Provider, MD  carvedilol (COREG) 3.125 MG tablet Take 3.125 mg by mouth 2 (two) times daily with a meal.   Yes Historical Provider, MD  Chromium Picolinate 800 MCG TABS Take 1,600 mcg by mouth daily.    Yes Historical Provider, MD  clopidogrel (PLAVIX) 75 MG tablet Take 1 tablet (75 mg total) by mouth daily with breakfast. 05/22/16  Yes Robbie Lis, MD  Coenzyme  Q10 (CO Q-10) 100 MG CAPS Take 100 mg by mouth at bedtime.    Yes Historical Provider, MD  furosemide (LASIX) 20 MG tablet Take 20 mg by mouth 2 (two) times daily.   Yes Historical Provider, MD  glipiZIDE (GLUCOTROL XL) 10 MG 24 hr tablet Take 10 mg by mouth at bedtime.   Yes Historical Provider, MD  glipiZIDE (GLUCOTROL XL) 5 MG 24 hr tablet Take 1 tablet (5 mg total) by mouth daily. Patient taking differently: Take  2.5 mg by mouth daily with breakfast.  05/22/16  Yes Robbie Lis, MD  guaiFENesin (MUCINEX) 600 MG 12 hr tablet Take 1 tablet (600 mg total) by mouth 2 (two) times daily as needed for to loosen phlegm. 05/22/16  Yes Robbie Lis, MD  HYDROcodone-acetaminophen (NORCO/VICODIN) 5-325 MG per tablet Take 1 tablet by mouth daily as needed for moderate pain or severe pain.  10/26/13  Yes Historical Provider, MD  isosorbide dinitrate (ISORDIL) 10 MG tablet Take 1 tablet (10 mg total) by mouth 2 (two) times daily. 05/22/16  Yes Robbie Lis, MD  Multiple Vitamins-Minerals (OCUVITE PO) Take 1 tablet by mouth every morning.   Yes Historical Provider, MD  nitroGLYCERIN (NITROSTAT) 0.4 MG SL tablet Place 1 tablet (0.4 mg total) under the tongue every 5 (five) minutes as needed for chest pain. 05/30/16  Yes Josue Hector, MD  PRESCRIPTION MEDICATION Inhale 2 puffs into the lungs every morning. Inhaler that starts with "e" or "r"   Yes Historical Provider, MD  ranolazine (RANEXA) 500 MG 12 hr tablet Take 1 tablet (500 mg total) by mouth 2 (two) times daily. 05/30/16  Yes Josue Hector, MD  simvastatin (ZOCOR) 20 MG tablet Take 20 mg by mouth at bedtime.  10/21/12  Yes Historical Provider, MD  Tamsulosin HCl (FLOMAX) 0.4 MG CAPS Take 0.4 mg by mouth at bedtime.  10/21/12  Yes Historical Provider, MD  zinc gluconate 50 MG tablet Take 50 mg by mouth at bedtime.    Yes Historical Provider, MD   Allergies  Allergen Reactions  . Hydrocodone Other (See Comments)    NIghtmares, hallucinations    FAMILY HISTORY:  family history includes Cancer in his daughter; Deep vein thrombosis in his mother; Diabetes in his brother, mother, and sister; Heart attack in his father and mother; Heart disease in his brother, father, mother, and sister; Hyperlipidemia in his brother, father, mother, and sister; Hypertension in his brother, father, mother, and sister; Other in his father. SOCIAL HISTORY:  reports that he quit smoking  about 30 years ago. His smoking use included Cigarettes. He has a 90 pack-year smoking history. He has quit using smokeless tobacco. His smokeless tobacco use included Chew. He reports that he does not drink alcohol or use illicit drugs.  REVIEW OF SYSTEMS:   As per HPI - All other systems reviewed and were neg.    SUBJECTIVE:   VITAL SIGNS: Temp:  [97.5 F (36.4 C)-98.4 F (36.9 C)] 98.2 F (36.8 C) (06/07 0812) Pulse Rate:  [75-105] 93 (06/07 0812) Resp:  [15-30] 20 (06/07 0812) BP: (123-147)/(69-89) 143/69 mmHg (06/07 0812) SpO2:  [88 %-98 %] 92 % (06/07 0900) FiO2 (%):  [30 %] 30 % (06/07 0320) Weight:  [75.751 kg (167 lb)-84 kg (185 lb 3 oz)] 84 kg (185 lb 3 oz) (06/07 0630)  PHYSICAL EXAMINATION: General:  Pleasant male, chronically ill appearing, NAD off bipap  Neuro:  Awake, alert, appropriate, MAE, gen weakness  HEENT:  Mm  moist, no JVD  Cardiovascular:  s1s2 rrr Lungs:  resps even, non labored off bipap, mild tachypnea but no increased WOB, coarse R>L, diminished bases L>R, scattered crackles  Abdomen:  Round, soft, non tender  Musculoskeletal:  Warm and dry, scant BLE edema    Recent Labs Lab 06/11/2016 2007 06/06/16 0534  NA 133* 136  K 5.1 5.0  CL 103 103  CO2 20* 18*  BUN 55* 49*  CREATININE 3.07* 2.90*  GLUCOSE 169* 133*    Recent Labs Lab 06/04/2016 2007 06/06/16 0534  HGB 9.1* 8.9*  HCT 28.5* 28.1*  WBC 10.4 11.0*  PLT 369 405*   Dg Chest 2 View  06/14/2016  CLINICAL DATA:  Shortness of breath for several weeks. EXAM: CHEST  2 VIEW COMPARISON:  05/21/2016 FINDINGS: Cardiomediastinal silhouette is normal. Mediastinal contours appear intact. Aortic atherosclerosis is seen. There is no evidence of pneumothorax. There is an enlarging left pleural effusion. Left lung base atelectasis or airspace consolidation cannot be excluded. Osseous structures are without acute abnormality. Soft tissues are grossly normal. IMPRESSION: Enlarging left pleural effusion,  with probable associated atelectasis or airspace consolidation the left lung base. Electronically Signed   By: Fidela Salisbury M.D.   On: 06/27/2016 20:54   Dg Chest Port 1 View  06/06/2016  CLINICAL DATA:  Initial evaluation for acute shortness of breath, left pleural effusion. EXAM: PORTABLE CHEST 1 VIEW COMPARISON:  Prior radiograph from 06/23/2016 as well as prior CT from 05/16/2016. FINDINGS: Cardiomegaly is grossly stable from prior. Known left hilar mass not well visualized on this exam. Similarly, previously seen a mediastinal adenopathy also not well seen. Extensive atheromatous plaque within the intrathoracic aorta. A moderate left pleural effusion is slightly increased from prior. Associated left basilar atelectasis/ consolidation. Slightly increased pulmonary vascular congestion as compared to previous. No new focal infiltrates. No pneumothorax. Osseous structures unchanged. Remote left clavicular fracture noted. IMPRESSION: 1. Slightly interval increase in moderate layering left pleural effusion. Similar associated left basilar atelectasis and/or consolidation. Known left hilar mass not well seen on this exam. 2. Slightly worsened pulmonary vascular congestion as compared to prior radiograph from 06/01/2016. Electronically Signed   By: Jeannine Boga M.D.   On: 06/06/2016 03:04    ASSESSMENT / PLAN:  Acute on chronic respiratory failure - multifactorial r/t enlarging L pleural effusion and pulmonary edema in setting decompensated diastolic dysfunction c/b acute on CKD 3-4 with underlying mod/severe COPD.  COPD  Acute on chronic dCHF  AKI on CKD CAD - s/p recent RCA stent   PLAN -  Cont bipap PRN and qhs  Would continue attempts at diuresis -- Does have elevated SCr above baseline (Scr 1.8-2.0) -  But still with good UOP, pulm edema/effusion May need thoracentesis - has been avoided previously r/t need to continue plavix with recent stent placement - will eval with u/s  Agree  with empiric abx for HCAP although less likely  Pulmonary hygiene -- add flutter, IS  BD's  F/u CXR  Trend troponin  Cardiology to see    Nickolas Madrid, NP 06/06/2016  9:08 AM Pager: [phone] or [phone]   Attending Note:  82 year old man with history of multiple falls, COPD and CAD who presents to the hospital with the SOB.  CXR revealed a large left sided pleural effusion and pulmonary edema.  On CXR that I reviewed myself a large and worsening left sided pleural effusion and pulmonary edema.  On exam, no BS on the left.  Discussed with PCCM-NP.    Pleural effusion:  - Thora today.  - Fluid analysis.  - Diureses.  Pulmonary edema:  - Lasix.  - Treat CHF.  Acute respiratory failure: Due to pulmonary edema and pleural effusion.  - Supplemental O2.  - Diureses as ordered.  Inocencio Homes.  Hypoxemia:  - Titrate O2 for sat of 88-92%.  - May need an ambulatory desat study prior to discharge to determine need for home O2.  COPD:  - Bronchodilators as ordered.  - Pulmonary hygienes.  Patient seen and examined, agree with above note.  I dictated the care and orders written for this patient under my direction.  Rush Farmer, MD [phone]

## 2016-06-06 NOTE — Progress Notes (Signed)
Pt taken off Bipap and placed 6 l n/c Dyspnea on exertion reminded pt to breathe in through his nose & out through his mouth

## 2016-06-06 NOTE — Progress Notes (Signed)
PROGRESS NOTE    Alan Ford  YIR:[id number] DOB: 1930-07-17 DOA: 05/31/2016 PCP: Mathews Argyle, MD   Brief Narrative:  82 yo male NSTEMI 2 weeks ago with cath/stent placed in RCA, copd on 3 liters [place] o2 at home, new lung mass uncertain diagnosis, CHF, afib comes in with progressive worsening sob and cough since he was discharged. Has been coughing which is worse while lying down and is sometimes productive. He reports worsening orthopnea and PND. Denies fevers or chills. Denies any worsening of his peripheral edema. He has intermittent chest pain for the past 3 weeks. No hemoptysis. He had a left pleural effusion and lung mass. He has seen his cardiologist and pulmonologist in the last couple weeks and it sounds like a thoracentesis was entertained but did not want to be done due To him being on plavix. Cxr shows worsening left pleural effusion and worsening hypoxic hypoxia.   Assessment & Plan:   Principal Problem:   Acute on chronic respiratory failure with hypoxia (HCC) - Consulted Pulmonologist for assistance with management - Agree with continued attempts at diuresis. Patient may need thoracentesis  Active Problems:   Chronic diastolic heart failure ([place]) - Pulm reports that cardiology to see patient. Suspect that they have placed consult. - Pt on isordil and B blocker    Dyspnea   COPD (chronic obstructive pulmonary disease) (HCC)/ ILD (interstitial lung disease) (HCC) - pulm on board    Essential hypertension - continue current antihypertensive regimen    Coronary artery disease involving native coronary artery of native heart without angina pectoris - aspirin    Lung mass   Pleural effusion on left - possible thoracentesis    Acute on chronic renal failure ([place]) - monitor S creatinine   DVT prophylaxis: SCD's Code Status: full Family Communication: d/c patient directly Disposition Plan: Pending improvement in respiratory status   Consultants:    Pulmonology   Procedures:   None   Antimicrobials: None   Subjective: None  Objective: Filed Vitals:   06/06/16 1150 06/06/16 1200 06/06/16 1300 06/06/16 1357  BP: 143/75 144/77 139/75 139/75  Pulse: 96 100 98 104  Temp: 98.3 F (36.8 C)     TempSrc: Oral     Resp: '27 26 25 16  '$ Height:      Weight:      SpO2: 90% 90% 91% 97%    Intake/Output Summary (Last 24 hours) at 06/06/16 1433 Last data filed at 06/06/16 1151  Gross per 24 hour  Intake    683 ml  Output    650 ml  Net     33 ml   Filed Weights   06/28/2016 1927 05/31/2016 2315 06/06/16 0630  Weight: 75.751 kg (167 lb) 79.9 kg (176 lb 2.4 oz) 84 kg (185 lb 3 oz)    Examination:  General exam: Appears calm and comfortable  Respiratory system: Decreased lung sounds at left Cardiovascular system: S1 & S2 heard, RRR. Gastrointestinal system: Abdomen is nondistended, soft and nontender. No organomegaly or masses felt. Normal bowel sounds heard. Central nervous system: Alert and oriented. No focal neurological deficits. Extremities: Symmetric, equal tone Skin: No rashes, lesions or ulcers Psychiatry: Judgement and insight appear normal. Mood & affect appropriate.   Data Reviewed: I have personally reviewed following labs and imaging studies  CBC:  Recent Labs Lab 05/31/2016 2007 06/06/16 0534  WBC 10.4 11.0*  NEUTROABS 7.9*  --   HGB 9.1* 8.9*  HCT 28.5* 28.1*  MCV 96.0 97.9  PLT  369 240*   Basic Metabolic Panel:  Recent Labs Lab 06/24/2016 2007 06/06/16 0534  NA 133* 136  K 5.1 5.0  CL 103 103  CO2 20* 18*  GLUCOSE 169* 133*  BUN 55* 49*  CREATININE 3.07* 2.90*  CALCIUM 8.6* 8.4*   GFR: Estimated Creatinine Clearance: 19.6 mL/min (by C-G formula based on Cr of 2.9). Liver Function Tests: No results for input(s): AST, ALT, ALKPHOS, BILITOT, PROT, ALBUMIN in the last 168 hours. No results for input(s): LIPASE, AMYLASE in the last 168 hours. No results for input(s): AMMONIA in the last 168  hours. Coagulation Profile: No results for input(s): INR, PROTIME in the last 168 hours. Cardiac Enzymes:  Recent Labs Lab 06/03/2016 2007 06/01/2016 2343 06/06/16 0534 06/06/16 1127  TROPONINI 0.03 <0.03 0.03 0.03   BNP (last 3 results)  Recent Labs  05/14/16 1012  PROBNP 1454.0*   HbA1C: No results for input(s): HGBA1C in the last 72 hours. CBG:  Recent Labs Lab 06/06/16 0027 06/06/16 0344 06/06/16 0811 06/06/16 1149  GLUCAP 186* 123* 119* 136*   Lipid Profile: No results for input(s): CHOL, HDL, LDLCALC, TRIG, CHOLHDL, LDLDIRECT in the last 72 hours. Thyroid Function Tests: No results for input(s): TSH, T4TOTAL, FREET4, T3FREE, THYROIDAB in the last 72 hours. Anemia Panel: No results for input(s): VITAMINB12, FOLATE, FERRITIN, TIBC, IRON, RETICCTPCT in the last 72 hours. Sepsis Labs: No results for input(s): PROCALCITON, LATICACIDVEN in the last 168 hours.  No results found for this or any previous visit (from the past 240 hour(s)).       Radiology Studies: Dg Chest 2 View  06/09/2016  CLINICAL DATA:  Shortness of breath for several weeks. EXAM: CHEST  2 VIEW COMPARISON:  05/21/2016 FINDINGS: Cardiomediastinal silhouette is normal. Mediastinal contours appear intact. Aortic atherosclerosis is seen. There is no evidence of pneumothorax. There is an enlarging left pleural effusion. Left lung base atelectasis or airspace consolidation cannot be excluded. Osseous structures are without acute abnormality. Soft tissues are grossly normal. IMPRESSION: Enlarging left pleural effusion, with probable associated atelectasis or airspace consolidation the left lung base. Electronically Signed   By: Fidela Salisbury M.D.   On: 06/02/2016 20:54   Dg Chest Port 1 View  06/06/2016  CLINICAL DATA:  Initial evaluation for acute shortness of breath, left pleural effusion. EXAM: PORTABLE CHEST 1 VIEW COMPARISON:  Prior radiograph from 06/19/2016 as well as prior CT from 05/16/2016.  FINDINGS: Cardiomegaly is grossly stable from prior. Known left hilar mass not well visualized on this exam. Similarly, previously seen a mediastinal adenopathy also not well seen. Extensive atheromatous plaque within the intrathoracic aorta. A moderate left pleural effusion is slightly increased from prior. Associated left basilar atelectasis/ consolidation. Slightly increased pulmonary vascular congestion as compared to previous. No new focal infiltrates. No pneumothorax. Osseous structures unchanged. Remote left clavicular fracture noted. IMPRESSION: 1. Slightly interval increase in moderate layering left pleural effusion. Similar associated left basilar atelectasis and/or consolidation. Known left hilar mass not well seen on this exam. 2. Slightly worsened pulmonary vascular congestion as compared to prior radiograph from 06/17/2016. Electronically Signed   By: Jeannine Boga M.D.   On: 06/06/2016 03:04        Scheduled Meds: . antiseptic oral rinse  7 mL Mouth Rinse q12n4p  . aspirin EC  81 mg Oral QHS  . carvedilol  3.125 mg Oral BID WC  . ceFEPime (MAXIPIME) IV  1 g Intravenous Q24H  . chlorhexidine  15 mL Mouth Rinse BID  .  clopidogrel  75 mg Oral Q breakfast  . insulin aspart  0-9 Units Subcutaneous TID WC & HS  . ipratropium-albuterol  3 mL Nebulization Q6H  . isosorbide dinitrate  10 mg Oral BID  . ranolazine  500 mg Oral BID  . sodium chloride flush  3 mL Intravenous Q12H  . sodium chloride flush  3 mL Intravenous Q12H  . [START ON 06/07/2016] vancomycin  1,000 mg Intravenous Q48H   Continuous Infusions:    LOS: 1 day    Time spent: > 35 minutes    Velvet Bathe, MD [facility] Pager [phone]  If 7PM-7AM, please contact night-coverage [email or web address] Password [facility] 06/06/2016, 2:33 PM

## 2016-06-07 DIAGNOSIS — R591 Generalized enlarged lymph nodes: Secondary | ICD-10-CM

## 2016-06-07 LAB — BASIC METABOLIC PANEL
Anion gap: 11 (ref 5–15)
BUN: 39 mg/dL — ABNORMAL HIGH (ref 6–20)
CHLORIDE: 103 mmol/L (ref 101–111)
CO2: 19 mmol/L — AB (ref 22–32)
CREATININE: 2.42 mg/dL — AB (ref 0.61–1.24)
Calcium: 8.4 mg/dL — ABNORMAL LOW (ref 8.9–10.3)
GFR calc non Af Amer: 23 mL/min — ABNORMAL LOW (ref >60)
GFR, EST AFRICAN AMERICAN: 27 mL/min — AB (ref >60)
Glucose, Bld: 115 mg/dL — ABNORMAL HIGH (ref 65–99)
Potassium: 4.8 mmol/L (ref 3.5–5.1)
Sodium: 133 mmol/L — ABNORMAL LOW (ref 135–145)

## 2016-06-07 LAB — GLUCOSE, CAPILLARY
GLUCOSE-CAPILLARY: 139 mg/dL — AB (ref 65–99)
GLUCOSE-CAPILLARY: 98 mg/dL (ref 65–99)
Glucose-Capillary: 122 mg/dL — ABNORMAL HIGH (ref 65–99)
Glucose-Capillary: 144 mg/dL — ABNORMAL HIGH (ref 65–99)
Glucose-Capillary: 163 mg/dL — ABNORMAL HIGH (ref 65–99)
Glucose-Capillary: 115 mg/dL — ABNORMAL HIGH (ref 65–99)
Glucose-Capillary: 188 mg/dL — ABNORMAL HIGH (ref 65–99)

## 2016-06-07 LAB — MAGNESIUM: Magnesium: 2.6 mg/dL — ABNORMAL HIGH (ref 1.7–2.4)

## 2016-06-07 MED ORDER — LEVALBUTEROL HCL 0.63 MG/3ML IN NEBU
0.6300 mg | INHALATION_SOLUTION | Freq: Four times a day (QID) | RESPIRATORY_TRACT | Status: DC
  Administered 2016-06-07 – 2016-06-10 (×14): 0.63 mg via RESPIRATORY_TRACT
  Filled 2016-06-07 (×14): qty 3

## 2016-06-07 MED ORDER — LEVALBUTEROL HCL 0.63 MG/3ML IN NEBU
0.6300 mg | INHALATION_SOLUTION | RESPIRATORY_TRACT | Status: DC | PRN
  Administered 2016-06-08 – 2016-06-20 (×3): 0.63 mg via RESPIRATORY_TRACT
  Filled 2016-06-07 (×3): qty 3

## 2016-06-07 MED ORDER — BUDESONIDE 0.25 MG/2ML IN SUSP
0.2500 mg | Freq: Four times a day (QID) | RESPIRATORY_TRACT | Status: DC
  Administered 2016-06-07 – 2016-06-10 (×14): 0.25 mg via RESPIRATORY_TRACT
  Filled 2016-06-07 (×14): qty 2

## 2016-06-07 MED ORDER — VANCOMYCIN HCL IN DEXTROSE 1-5 GM/200ML-% IV SOLN
1000.0000 mg | INTRAVENOUS | Status: DC
  Administered 2016-06-07 – 2016-06-10 (×4): 1000 mg via INTRAVENOUS
  Filled 2016-06-07 (×6): qty 200

## 2016-06-07 MED ORDER — IPRATROPIUM-ALBUTEROL 0.5-2.5 (3) MG/3ML IN SOLN
3.0000 mL | Freq: Three times a day (TID) | RESPIRATORY_TRACT | Status: DC
  Administered 2016-06-07: 3 mL via RESPIRATORY_TRACT
  Filled 2016-06-07: qty 3

## 2016-06-07 MED ORDER — ZOLPIDEM TARTRATE 5 MG PO TABS
5.0000 mg | ORAL_TABLET | Freq: Once | ORAL | Status: AC
  Administered 2016-06-07: 5 mg via ORAL
  Filled 2016-06-07: qty 1

## 2016-06-07 MED ORDER — IPRATROPIUM BROMIDE 0.02 % IN SOLN
0.5000 mg | Freq: Four times a day (QID) | RESPIRATORY_TRACT | Status: DC
  Administered 2016-06-07 – 2016-06-10 (×14): 0.5 mg via RESPIRATORY_TRACT
  Filled 2016-06-07 (×14): qty 2.5

## 2016-06-07 NOTE — Progress Notes (Signed)
14 beat run of vtach. MD notified. Orders to check electrolytes and start bipap.

## 2016-06-07 NOTE — Progress Notes (Signed)
Pharmacy Antibiotic Note  Alan Ford is a 82 y.o. male admitted on 06/27/2016 with pneumonia.  Pharmacy has been consulted for vancomycin and cefepime dosing.   SCr improved to 2.42 this morning, CrCl ~20-59m/min.   Plan: - Increased Vancomycin to 1g IV q24h - Cefepime 1g IV q24h - F/u renal fxn, C&S, clinical status and trough at SS  Height: '5\' 10"'$  (177.8 cm) Weight: 185 lb 3 oz (84 kg) IBW/kg (Calculated) : 73  Temp (24hrs), Avg:98.2 F (36.8 C), Min:97.6 F (36.4 C), Max:98.6 F (37 C)   Recent Labs Lab 06/04/2016 2007 06/06/16 0534 06/07/16 0315  WBC 10.4 11.0*  --   CREATININE 3.07* 2.90* 2.42*    Estimated Creatinine Clearance: 23.5 mL/min (by C-G formula based on Cr of 2.42).    Allergies  Allergen Reactions  . Hydrocodone Other (See Comments)    NIghtmares, hallucinations    Antimicrobials this admission: Vanc 6/6>> Cefepime 6/6>>  Dose adjustments this admission: 6/8: vancomycin increased to 1g IV q24h  Microbiology results: 6/6 BCx: ngtd 6/7 pleural fluid: pending  Thank you for allowing pharmacy to be a part of this patient's care.  Ahrianna Siglin D. Arielle Eber, PharmD, BCPS Clinical Pharmacist Pager: [phone]/07/2016 9:50 AM

## 2016-06-07 NOTE — Progress Notes (Signed)
PROGRESS NOTE    Alan Ford  HMC:[id number] DOB: 1930-09-10 DOA: 06/14/2016 PCP: Mathews Argyle, MD   Brief Narrative:  82 yo male NSTEMI 2 weeks ago with cath/stent placed in RCA, copd on 3 liters [place] o2 at home, new lung mass uncertain diagnosis, CHF, afib comes in with progressive worsening sob and cough since he was discharged. Has been coughing which is worse while lying down and is sometimes productive. He reports worsening orthopnea and PND. Denies fevers or chills. Denies any worsening of his peripheral edema. He has intermittent chest pain for the past 3 weeks. No hemoptysis. He had a left pleural effusion and lung mass. He has seen his cardiologist and pulmonologist in the last couple weeks and it sounds like a thoracentesis was entertained but did not want to be done due To him being on plavix. Cxr shows worsening left pleural effusion and worsening hypoxic hypoxia.   Assessment & Plan:   Principal Problem:   Acute on chronic respiratory failure with hypoxia (HCC) - Consulted Pulmonologist for assistance with management - Agree with continued attempts at diuresis. S/p thoracentesis  Active Problems:   Chronic diastolic heart failure (HCC) - Pt on isordil and B blocker    Dyspnea   COPD (chronic obstructive pulmonary disease) (HCC)/ ILD (interstitial lung disease) (HCC) - pulm on board    Essential hypertension - continue current antihypertensive regimen    Coronary artery disease involving native coronary artery of native heart without angina pectoris - aspirin    Lung mass   Pleural effusion on left - possible thoracentesis    Acute on chronic renal failure ([place]) - monitor S creatinine   DVT prophylaxis: SCD's Code Status: full Family Communication: d/c patient directly Disposition Plan: Pending improvement in respiratory status   Consultants:   Pulmonology   Procedures:   None   Antimicrobials:  None   Subjective: None  Objective: Filed Vitals:   06/07/16 0720 06/07/16 0747 06/07/16 0757 06/07/16 1106  BP: 124/78  124/78   Pulse: 104  90 104  Temp: 98 F (36.7 C)  97.6 F (36.4 C)   TempSrc: Oral  Axillary   Resp: '21  19 20  '$ Height:      Weight:      SpO2: 94% 97% 99% 94%    Intake/Output Summary (Last 24 hours) at 06/07/16 1204 Last data filed at 06/07/16 0721  Gross per 24 hour  Intake    600 ml  Output   2850 ml  Net  -2250 ml   Filed Weights   06/14/2016 1927 06/23/2016 2315 06/06/16 0630  Weight: 75.751 kg (167 lb) 79.9 kg (176 lb 2.4 oz) 84 kg (185 lb 3 oz)    Examination:  General exam: Appears calm and comfortable  Respiratory system: improved aeration to left lung, equal chest rise, no wheezes Cardiovascular system: S1 & S2 heard, RRR. Gastrointestinal system: Abdomen is nondistended, soft and nontender. No organomegaly or masses felt. Normal bowel sounds heard. Central nervous system: Alert and oriented. No focal neurological deficits. Extremities: Symmetric, equal tone Skin: No rashes, lesions or ulcers Psychiatry: Judgement and insight appear normal. Mood & affect appropriate.   Data Reviewed: I have personally reviewed following labs and imaging studies  CBC:  Recent Labs Lab 06/17/2016 2007 06/06/16 0534 06/06/16 1445  WBC 10.4 11.0*  --   NEUTROABS 7.9*  --   --   HGB 9.1* 8.9*  --   HCT 28.5* 28.1* 30.6*  MCV 96.0 97.9  --  PLT 369 405*  --    Basic Metabolic Panel:  Recent Labs Lab 06/08/2016 2007 06/06/16 0534 06/07/16 0315  NA 133* 136 133*  K 5.1 5.0 4.8  CL 103 103 103  CO2 20* 18* 19*  GLUCOSE 169* 133* 115*  BUN 55* 49* 39*  CREATININE 3.07* 2.90* 2.42*  CALCIUM 8.6* 8.4* 8.4*  MG  --   --  2.6*   GFR: Estimated Creatinine Clearance: 23.5 mL/min (by C-G formula based on Cr of 2.42). Liver Function Tests:  Recent Labs Lab 06/06/16 1445  PROT 6.1*   No results for input(s): LIPASE, AMYLASE in the last 168  hours. No results for input(s): AMMONIA in the last 168 hours. Coagulation Profile: No results for input(s): INR, PROTIME in the last 168 hours. Cardiac Enzymes:  Recent Labs Lab 06/06/2016 2007 06/16/2016 2343 06/06/16 0534 06/06/16 1127  TROPONINI 0.03 <0.03 0.03 0.03   BNP (last 3 results)  Recent Labs  05/14/16 1012  PROBNP 1454.0*   HbA1C: No results for input(s): HGBA1C in the last 72 hours. CBG:  Recent Labs Lab 06/06/16 1546 06/06/16 1948 06/07/16 0012 06/07/16 0347 06/07/16 0758  GLUCAP 250* 245* 98 122* 139*   Lipid Profile:  Recent Labs  06/06/16 1445  CHOL 106   Thyroid Function Tests: No results for input(s): TSH, T4TOTAL, FREET4, T3FREE, THYROIDAB in the last 72 hours. Anemia Panel: No results for input(s): VITAMINB12, FOLATE, FERRITIN, TIBC, IRON, RETICCTPCT in the last 72 hours. Sepsis Labs: No results for input(s): PROCALCITON, LATICACIDVEN in the last 168 hours.  Recent Results (from the past 240 hour(s))  Blood culture (routine x 2)     Status: None (Preliminary result)   Collection Time: 06/13/2016 10:18 PM  Result Value Ref Range Status   Specimen Description BLOOD LEFT ARM  Final   Special Requests IN PEDIATRIC BOTTLE 3CC  Final   Culture NO GROWTH < 24 HOURS  Final   Report Status PENDING  Incomplete  Blood culture (routine x 2)     Status: None (Preliminary result)   Collection Time: 06/02/2016 10:23 PM  Result Value Ref Range Status   Specimen Description BLOOD LEFT HAND  Final   Special Requests IN PEDIATRIC BOTTLE 3CC  Final   Culture NO GROWTH < 24 HOURS  Final   Report Status PENDING  Incomplete  Culture, body fluid-bottle     Status: None (Preliminary result)   Collection Time: 06/06/16  2:43 PM  Result Value Ref Range Status   Specimen Description PLEURAL LEFT  Final   Special Requests AEROBIC BOTTLE ONLY 6CC  Final   Culture PENDING  Incomplete   Report Status PENDING  Incomplete  Gram stain     Status: None   Collection  Time: 06/06/16  2:43 PM  Result Value Ref Range Status   Specimen Description PLEURAL LEFT  Final   Special Requests NONE  Final   Gram Stain   Final    ABUNDANT WBC PRESENT,BOTH PMN AND MONONUCLEAR NO ORGANISMS SEEN    Report Status 06/06/2016 FINAL  Final         Radiology Studies: Dg Chest 2 View  06/11/2016  CLINICAL DATA:  Shortness of breath for several weeks. EXAM: CHEST  2 VIEW COMPARISON:  05/21/2016 FINDINGS: Cardiomediastinal silhouette is normal. Mediastinal contours appear intact. Aortic atherosclerosis is seen. There is no evidence of pneumothorax. There is an enlarging left pleural effusion. Left lung base atelectasis or airspace consolidation cannot be excluded. Osseous structures are without acute  abnormality. Soft tissues are grossly normal. IMPRESSION: Enlarging left pleural effusion, with probable associated atelectasis or airspace consolidation the left lung base. Electronically Signed   By: Fidela Salisbury M.D.   On: 06/23/2016 20:54   Dg Chest Port 1 View  06/06/2016  CLINICAL DATA:  82 year old male status post left-sided thoracentesis. EXAM: PORTABLE CHEST 1 VIEW COMPARISON:  Chest x-ray 06/06/2016. FINDINGS: Incomplete visualization of the lower left hemithorax. Despite this limitation, the moderate left-sided pleural effusion appears slightly decreased in size compared to the prior examination. No appreciable pneumothorax. There continues to be atelectasis and/or consolidation left lung base. Right lung appears relatively clear, although there is widespread peribronchial cuffing throughout the lungs bilaterally. No cephalization of the pulmonary vasculature. The cardiac silhouette is partially obscured, but appears borderline to mildly enlarged. The patient is rotated to the right on today's exam, resulting in distortion of the mediastinal contours and reduced diagnostic sensitivity and specificity for mediastinal pathology. Atherosclerosis in the thoracic aorta.  IMPRESSION: 1. Decreased size of moderate left pleural effusion following thoracentesis. No pneumothorax. 2. Persistent atelectasis and/or airspace consolidation in left lower lobe. 3. Diffuse peribronchial cuffing, concerning for an acute bronchitis. 4. Atherosclerosis. Electronically Signed   By: Vinnie Langton M.D.   On: 06/06/2016 15:49   Dg Chest Port 1 View  06/06/2016  CLINICAL DATA:  Initial evaluation for acute shortness of breath, left pleural effusion. EXAM: PORTABLE CHEST 1 VIEW COMPARISON:  Prior radiograph from 06/25/2016 as well as prior CT from 05/16/2016. FINDINGS: Cardiomegaly is grossly stable from prior. Known left hilar mass not well visualized on this exam. Similarly, previously seen a mediastinal adenopathy also not well seen. Extensive atheromatous plaque within the intrathoracic aorta. A moderate left pleural effusion is slightly increased from prior. Associated left basilar atelectasis/ consolidation. Slightly increased pulmonary vascular congestion as compared to previous. No new focal infiltrates. No pneumothorax. Osseous structures unchanged. Remote left clavicular fracture noted. IMPRESSION: 1. Slightly interval increase in moderate layering left pleural effusion. Similar associated left basilar atelectasis and/or consolidation. Known left hilar mass not well seen on this exam. 2. Slightly worsened pulmonary vascular congestion as compared to prior radiograph from 06/13/2016. Electronically Signed   By: Jeannine Boga M.D.   On: 06/06/2016 03:04        Scheduled Meds: . antiseptic oral rinse  7 mL Mouth Rinse q12n4p  . aspirin EC  81 mg Oral QHS  . budesonide (PULMICORT) nebulizer solution  0.25 mg Nebulization Q6H  . carvedilol  3.125 mg Oral BID WC  . ceFEPime (MAXIPIME) IV  1 g Intravenous Q24H  . chlorhexidine  15 mL Mouth Rinse BID  . clopidogrel  75 mg Oral Q breakfast  . insulin aspart  0-9 Units Subcutaneous TID WC & HS  . ipratropium  0.5 mg  Nebulization Q6H  . isosorbide dinitrate  10 mg Oral BID  . levalbuterol  0.63 mg Nebulization Q6H  . ranolazine  500 mg Oral BID  . sodium chloride flush  3 mL Intravenous Q12H  . sodium chloride flush  3 mL Intravenous Q12H  . vancomycin  1,000 mg Intravenous Q24H   Continuous Infusions:    LOS: 2 days    Time spent: > 35 minutes    Velvet Bathe, MD [facility] Pager [phone]  If 7PM-7AM, please contact night-coverage [email or web address] Password [facility] 06/07/2016, 12:04 PM

## 2016-06-07 NOTE — Progress Notes (Addendum)
Name: Alan Ford MRN: [id number] DOB: May 26, 1930    ADMISSION DATE:  06/16/2016 CONSULTATION DATE:  6/7  REFERRING MD :  Wendee Beavers ([facility])   CHIEF COMPLAINT:  Respiratory failure   BRIEF PATIENT DESCRIPTION: 82yo male former smoker with hx DM, chronic dCHF, CKD 3-4, OSA on CPAP, ILD (NSIP) and COPD on 3L home O2 followed by Dr. Lamonte Sakai as well as known L hilar lung mass.  Had recent admit (5/15-5/23) for NSTEMI with cath and RCA stent placed.  He returned 6/6 with c/o progressive dyspnea, orthopnea, cough.  He was admitted by [facility], CXR revealed slight increase in known pleural effusion and worsening pulmonary edema.  He was started on bipap and admitted to SDU where PCCM was consulted to assist with pulmonary needs. He underwent L thoracentesis with 1.5 L removed 6/7. Fluid was exudate by LDH only (marked pleural elevation to 1217, serum 188).   SIGNIFICANT EVENTS    STUDIES:  L thora 6/7: LDH ratio 6.5, Protein ratio 0.5. Exudate by LDH, Gram stain neg. Culture *, Glucose 90, Cholesterol *, pH *,  ADA *  SUBJECTIVE: Breathing better today, but did need BiPAP overnight and now on 100% NRB. No pain.   VITAL SIGNS: Temp:  [97.6 F (36.4 C)-98.6 F (37 C)] 97.6 F (36.4 C) (06/08 0757) Pulse Rate:  [90-104] 90 (06/08 0757) Resp:  [15-29] 19 (06/08 0757) BP: (110-156)/(37-115) 124/78 mmHg (06/08 0757) SpO2:  [90 %-99 %] 99 % (06/08 0757)  PHYSICAL EXAMINATION: General:  Pleasant male, chronically ill appearing, mild WOB on 15L NRB  Neuro:  Awake, alert, appropriate, MAE, gen weakness  HEENT:  Mm moist, no JVD  Cardiovascular:  Mildly tachy, short run on VT on monitor while I was at bedside.  Lungs:  resps even, mildly labored off BiPAP, scant wheeze Abdomen:  Round, soft, non tender  Musculoskeletal:  Warm and dry, scant BLE edema    Recent Labs Lab 06/19/2016 2007 06/06/16 0534 06/07/16 0315  NA 133* 136 133*  K 5.1 5.0 4.8  CL 103 103 103  CO2 20* 18* 19*  BUN 55* 49* 39*   CREATININE 3.07* 2.90* 2.42*  GLUCOSE 169* 133* 115*    Recent Labs Lab 06/21/2016 2007 06/06/16 0534 06/06/16 1445  HGB 9.1* 8.9*  --   HCT 28.5* 28.1* 30.6*  WBC 10.4 11.0*  --   PLT 369 405*  --    Dg Chest 2 View  06/22/2016  CLINICAL DATA:  Shortness of breath for several weeks. EXAM: CHEST  2 VIEW COMPARISON:  05/21/2016 FINDINGS: Cardiomediastinal silhouette is normal. Mediastinal contours appear intact. Aortic atherosclerosis is seen. There is no evidence of pneumothorax. There is an enlarging left pleural effusion. Left lung base atelectasis or airspace consolidation cannot be excluded. Osseous structures are without acute abnormality. Soft tissues are grossly normal. IMPRESSION: Enlarging left pleural effusion, with probable associated atelectasis or airspace consolidation the left lung base. Electronically Signed   By: Fidela Salisbury M.D.   On: 06/24/2016 20:54   Dg Chest Port 1 View  06/06/2016  CLINICAL DATA:  82 year old male status post left-sided thoracentesis. EXAM: PORTABLE CHEST 1 VIEW COMPARISON:  Chest x-ray 06/06/2016. FINDINGS: Incomplete visualization of the lower left hemithorax. Despite this limitation, the moderate left-sided pleural effusion appears slightly decreased in size compared to the prior examination. No appreciable pneumothorax. There continues to be atelectasis and/or consolidation left lung base. Right lung appears relatively clear, although there is widespread peribronchial cuffing throughout the lungs bilaterally. No  cephalization of the pulmonary vasculature. The cardiac silhouette is partially obscured, but appears borderline to mildly enlarged. The patient is rotated to the right on today's exam, resulting in distortion of the mediastinal contours and reduced diagnostic sensitivity and specificity for mediastinal pathology. Atherosclerosis in the thoracic aorta. IMPRESSION: 1. Decreased size of moderate left pleural effusion following thoracentesis.  No pneumothorax. 2. Persistent atelectasis and/or airspace consolidation in left lower lobe. 3. Diffuse peribronchial cuffing, concerning for an acute bronchitis. 4. Atherosclerosis. Electronically Signed   By: Vinnie Langton M.D.   On: 06/06/2016 15:49   Dg Chest Port 1 View  06/06/2016  CLINICAL DATA:  Initial evaluation for acute shortness of breath, left pleural effusion. EXAM: PORTABLE CHEST 1 VIEW COMPARISON:  Prior radiograph from 06/19/2016 as well as prior CT from 05/16/2016. FINDINGS: Cardiomegaly is grossly stable from prior. Known left hilar mass not well visualized on this exam. Similarly, previously seen a mediastinal adenopathy also not well seen. Extensive atheromatous plaque within the intrathoracic aorta. A moderate left pleural effusion is slightly increased from prior. Associated left basilar atelectasis/ consolidation. Slightly increased pulmonary vascular congestion as compared to previous. No new focal infiltrates. No pneumothorax. Osseous structures unchanged. Remote left clavicular fracture noted. IMPRESSION: 1. Slightly interval increase in moderate layering left pleural effusion. Similar associated left basilar atelectasis and/or consolidation. Known left hilar mass not well seen on this exam. 2. Slightly worsened pulmonary vascular congestion as compared to prior radiograph from 06/03/2016. Electronically Signed   By: Jeannine Boga M.D.   On: 06/06/2016 03:04    ASSESSMENT / PLAN:   Acute on chronic respiratory failure - multifactorial r/t enlarging L pleural effusion and pulmonary edema in setting decompensated diastolic dysfunction c/b acute on CKD 3-4 with underlying mod/severe COPD and ILD.  L pleural effusion s/p thora 6/7 (Exudate by LDH [1217] only). Interstitial Lung disease COPD without acute exacerbation OSA on CPAP - QHS CPAP - patient prefers home mask, wife to bring.  - PRN BIPAP for dyspnea - Titrate FiO2 to maintain SpO2 90-94% - Follow pleural fluid  studies as above - Scheduled and PRN BDs (to xopenex with tachy and NSVT runs) - Add Nebulized steroids, low threshold to initiate systemic. - Renal function improved from yesterday with diuresis, would be aggressive with this.  - Intermittent CXR - Incentive spirometry, flutter valve.   Acute on chronic dCHF  AKI on CKD CAD - s/p recent RCA stent  PLAN -  - Per primary team  Georgann Housekeeper, AGACNP-BC [place] [facility] Pager [phone] or [phone]  06/07/2016 10:22 AM  Attending Note:  82 year old man with history of multiple falls, COPD and CAD who presents to the hospital with the SOB. CXR revealed a large left sided pleural effusion and pulmonary edema. On CXR that I reviewed myself a large and worsening left sided pleural effusion and pulmonary edema. I reviewed CT of the chest, enlarging lymph nodes noted.  On exam, no BS on the left. Discussed with PCCM-NP and bedside RN. Decompensated overnight, required BiPAP, improved this AM to 100% NRB.  Pleural effusion: - Thora done 6/7. - Fluid analysis exudative by LDH criteria.  - Cytology pending. - Diureses.  Pulmonary edema: - Lasix 40 mg IV q8 x2 doses. - Treat CHF.  Acute respiratory failure: Due to pulmonary edema and pleural effusion. - Supplemental O2. - Diureses as above. Inocencio Homes done.  Hypoxemia: - Titrate O2 for sat of 88-92%. - May need an ambulatory desat study prior to  discharge to determine need for home O2.  COPD: - Bronchodilators as ordered. - Pulmonary hygienes.  Mediastinal LAN: enlarging on CT.  - Will await cytology of pleural fluid prior to deciding on biopsy, spoke with patient he would want treatment.  Patient seen and examined, agree with above note. I dictated the care and orders written for this patient under my  direction.  Rush Farmer, MD [phone]

## 2016-06-07 NOTE — Progress Notes (Signed)
06/07/2016 Patient voided at 300 cc at 0731. Dr Wendee Beavers was made aware patient have not voided since shift change. Bladder scan was perform by April NT at 1500 he had 93 cc in bladder. Patient voided 150cc at 1609. Rn on first shift did let third shift nurse aware what patient had voided during report. [facility] RN.

## 2016-06-07 NOTE — Progress Notes (Signed)
Inpatient Diabetes Program Recommendations  AACE/ADA: New Consensus Statement on Inpatient Glycemic Control (2015)  Target Ranges:  Prepandial:   less than 140 mg/dL      Peak postprandial:   less than 180 mg/dL (1-2 hours)      Critically ill patients:  140 - 180 mg/dL  Results for ILHAN, MADAN (MRN [id number]) as of 06/07/2016 10:46  Ref. Range 06/06/2016 08:11 06/06/2016 11:49 06/06/2016 15:46 06/06/2016 19:48 06/07/2016 00:12 06/07/2016 03:47 06/07/2016 07:58  Glucose-Capillary Latest Ref Range: 65-99 mg/dL 119 (H) 136 (H) 250 (H) 245 (H) 98 122 (H) 139 (H)   Review of Glycemic Control  Diabetes history: DM 2 Outpatient Diabetes medications: Glucotrol XL 2.5 mg ac breakfast Current orders for Inpatient glycemic control: Novolog correction 0-9 tid with meals & hs  Inpatient Diabetes Program Recommendations:  Please consider decrease in Novolog correction to 0-5 hs.  Thank you, Nani Gasser. Abbygail [place]hoite, RN, MSN, CDE Inpatient Glycemic Control Team Team Pager [phone] (8am-5pm) 06/07/2016 11:34 AM

## 2016-06-08 DIAGNOSIS — I5041 Acute combined systolic (congestive) and diastolic (congestive) heart failure: Secondary | ICD-10-CM

## 2016-06-08 DIAGNOSIS — R918 Other nonspecific abnormal finding of lung field: Secondary | ICD-10-CM

## 2016-06-08 LAB — GLUCOSE, CAPILLARY
GLUCOSE-CAPILLARY: 197 mg/dL — AB (ref 65–99)
Glucose-Capillary: 154 mg/dL — ABNORMAL HIGH (ref 65–99)
Glucose-Capillary: 141 mg/dL — ABNORMAL HIGH (ref 65–99)
Glucose-Capillary: 201 mg/dL — ABNORMAL HIGH (ref 65–99)
Glucose-Capillary: 138 mg/dL — ABNORMAL HIGH (ref 65–99)

## 2016-06-08 LAB — PH, BODY FLUID: pH, Body Fluid: 7.7

## 2016-06-08 NOTE — Progress Notes (Signed)
PROGRESS NOTE    Alan Ford  BEM:[id number] DOB: 06/15/1930 DOA: 06/18/2016 PCP: Mathews Argyle, MD   Brief Narrative:  82 yo male NSTEMI 2 weeks ago with cath/stent placed in RCA, copd on 3 liters [place] o2 at home, new lung mass uncertain diagnosis, CHF, afib comes in with progressive worsening sob and cough since he was discharged. Has been coughing which is worse while lying down and is sometimes productive. He reports worsening orthopnea and PND. Denies fevers or chills. Denies any worsening of his peripheral edema. He has intermittent chest pain for the past 3 weeks. No hemoptysis. He had a left pleural effusion and lung mass. He has seen his cardiologist and pulmonologist in the last couple weeks and it sounds like a thoracentesis was entertained but did not want to be done due To him being on plavix. Cxr shows worsening left pleural effusion and worsening hypoxic hypoxia.   Assessment & Plan:   Principal Problem:   Acute on chronic respiratory failure with hypoxia (HCC) - Consulted Pulmonologist for assistance with management - S/p thoracentesis  Active Problems:   Chronic diastolic heart failure (HCC) - Pt on isordil and B blocker    Dyspnea   COPD (chronic obstructive pulmonary disease) (HCC)/ ILD (interstitial lung disease) (HCC) - pulm on board    Essential hypertension - continue current antihypertensive regimen    Coronary artery disease involving native coronary artery of native heart without angina pectoris - aspirin    Lung mass   Pleural effusion on left - s/p thoracentesis    Acute on chronic renal failure (HCC) - monitor S creatinine   DVT prophylaxis: SCD's Code Status: full Family Communication: d/c patient directly Disposition Plan: Pending improvement in respiratory status and final recommendations by specialist involved.   Consultants:   Pulmonology   Procedures:   None   Antimicrobials:  None   Subjective: None  Objective: Filed Vitals:   06/08/16 1000 06/08/16 1100 06/08/16 1200 06/08/16 1300  BP: 113/79 109/77 121/64 138/80  Pulse: 100 99 99 97  Temp:  97.7 F (36.5 C)    TempSrc:  Oral    Resp: '22 20 19 18  '$ Height:      Weight:      SpO2: 96% 96% 91% 96%    Intake/Output Summary (Last 24 hours) at 06/08/16 1431 Last data filed at 06/08/16 1300  Gross per 24 hour  Intake   1333 ml  Output    875 ml  Net    458 ml   Filed Weights   06/26/2016 1927 06/01/2016 2315 06/06/16 0630  Weight: 75.751 kg (167 lb) 79.9 kg (176 lb 2.4 oz) 84 kg (185 lb 3 oz)    Examination:  General exam: Appears calm and comfortable  Respiratory system: improved aeration to left lung, equal chest rise, no wheezes Cardiovascular system: S1 & S2 heard, RRR. Gastrointestinal system: Abdomen is nondistended, soft and nontender. No organomegaly or masses felt. Normal bowel sounds heard. Central nervous system: Alert and oriented. No focal neurological deficits. Extremities: Symmetric, equal tone Skin: No rashes, lesions or ulcers Psychiatry: Judgement and insight appear normal. Mood & affect appropriate.   Data Reviewed: I have personally reviewed following labs and imaging studies  CBC:  Recent Labs Lab 06/04/2016 2007 06/06/16 0534 06/06/16 1445  WBC 10.4 11.0*  --   NEUTROABS 7.9*  --   --   HGB 9.1* 8.9*  --   HCT 28.5* 28.1* 30.6*  MCV 96.0 97.9  --  PLT 369 405*  --    Basic Metabolic Panel:  Recent Labs Lab 06/24/2016 2007 06/06/16 0534 06/07/16 0315  NA 133* 136 133*  K 5.1 5.0 4.8  CL 103 103 103  CO2 20* 18* 19*  GLUCOSE 169* 133* 115*  BUN 55* 49* 39*  CREATININE 3.07* 2.90* 2.42*  CALCIUM 8.6* 8.4* 8.4*  MG  --   --  2.6*   GFR: Estimated Creatinine Clearance: 23.5 mL/min (by C-G formula based on Cr of 2.42). Liver Function Tests:  Recent Labs Lab 06/06/16 1445  PROT 6.1*   No results for input(s): LIPASE, AMYLASE in the last 168 hours. No  results for input(s): AMMONIA in the last 168 hours. Coagulation Profile: No results for input(s): INR, PROTIME in the last 168 hours. Cardiac Enzymes:  Recent Labs Lab 06/29/2016 2007 06/27/2016 2343 06/06/16 0534 06/06/16 1127  TROPONINI 0.03 <0.03 0.03 0.03   BNP (last 3 results)  Recent Labs  05/14/16 1012  PROBNP 1454.0*   HbA1C: No results for input(s): HGBA1C in the last 72 hours. CBG:  Recent Labs Lab 06/07/16 1948 06/07/16 2329 06/08/16 0402 06/08/16 0826 06/08/16 1137  GLUCAP 115* 188* 154* 141* 201*   Lipid Profile:  Recent Labs  06/06/16 1445  CHOL 106   Thyroid Function Tests: No results for input(s): TSH, T4TOTAL, FREET4, T3FREE, THYROIDAB in the last 72 hours. Anemia Panel: No results for input(s): VITAMINB12, FOLATE, FERRITIN, TIBC, IRON, RETICCTPCT in the last 72 hours. Sepsis Labs: No results for input(s): PROCALCITON, LATICACIDVEN in the last 168 hours.  Recent Results (from the past 240 hour(s))  Blood culture (routine x 2)     Status: None (Preliminary result)   Collection Time: 06/12/2016 10:18 PM  Result Value Ref Range Status   Specimen Description BLOOD LEFT ARM  Final   Special Requests IN PEDIATRIC BOTTLE 3CC  Final   Culture NO GROWTH 2 DAYS  Final   Report Status PENDING  Incomplete  Blood culture (routine x 2)     Status: None (Preliminary result)   Collection Time: 06/27/2016 10:23 PM  Result Value Ref Range Status   Specimen Description BLOOD LEFT HAND  Final   Special Requests IN PEDIATRIC BOTTLE 3CC  Final   Culture NO GROWTH 2 DAYS  Final   Report Status PENDING  Incomplete  Culture, body fluid-bottle     Status: None (Preliminary result)   Collection Time: 06/06/16  2:43 PM  Result Value Ref Range Status   Specimen Description PLEURAL LEFT  Final   Special Requests AEROBIC BOTTLE ONLY 6CC  Final   Culture NO GROWTH 1 DAY  Final   Report Status PENDING  Incomplete  Gram stain     Status: None   Collection Time: 06/06/16   2:43 PM  Result Value Ref Range Status   Specimen Description PLEURAL LEFT  Final   Special Requests NONE  Final   Gram Stain   Final    ABUNDANT WBC PRESENT,BOTH PMN AND MONONUCLEAR NO ORGANISMS SEEN    Report Status 06/06/2016 FINAL  Final         Radiology Studies: Dg Chest Port 1 View  06/06/2016  CLINICAL DATA:  82 year old male status post left-sided thoracentesis. EXAM: PORTABLE CHEST 1 VIEW COMPARISON:  Chest x-ray 06/06/2016. FINDINGS: Incomplete visualization of the lower left hemithorax. Despite this limitation, the moderate left-sided pleural effusion appears slightly decreased in size compared to the prior examination. No appreciable pneumothorax. There continues to be atelectasis and/or consolidation left  lung base. Right lung appears relatively clear, although there is widespread peribronchial cuffing throughout the lungs bilaterally. No cephalization of the pulmonary vasculature. The cardiac silhouette is partially obscured, but appears borderline to mildly enlarged. The patient is rotated to the right on today's exam, resulting in distortion of the mediastinal contours and reduced diagnostic sensitivity and specificity for mediastinal pathology. Atherosclerosis in the thoracic aorta. IMPRESSION: 1. Decreased size of moderate left pleural effusion following thoracentesis. No pneumothorax. 2. Persistent atelectasis and/or airspace consolidation in left lower lobe. 3. Diffuse peribronchial cuffing, concerning for an acute bronchitis. 4. Atherosclerosis. Electronically Signed   By: Vinnie Langton M.D.   On: 06/06/2016 15:49        Scheduled Meds: . antiseptic oral rinse  7 mL Mouth Rinse q12n4p  . aspirin EC  81 mg Oral QHS  . budesonide (PULMICORT) nebulizer solution  0.25 mg Nebulization Q6H  . carvedilol  3.125 mg Oral BID WC  . ceFEPime (MAXIPIME) IV  1 g Intravenous Q24H  . chlorhexidine  15 mL Mouth Rinse BID  . clopidogrel  75 mg Oral Q breakfast  . insulin aspart   0-9 Units Subcutaneous TID WC & HS  . ipratropium  0.5 mg Nebulization Q6H  . isosorbide dinitrate  10 mg Oral BID  . levalbuterol  0.63 mg Nebulization Q6H  . ranolazine  500 mg Oral BID  . sodium chloride flush  3 mL Intravenous Q12H  . sodium chloride flush  3 mL Intravenous Q12H  . vancomycin  1,000 mg Intravenous Q24H   Continuous Infusions:    LOS: 3 days    Time spent: > 35 minutes    Velvet Bathe, MD [facility] Pager [phone]  If 7PM-7AM, please contact night-coverage [email or web address] Password [email or web address] 06/08/2016, 2:31 PM

## 2016-06-08 NOTE — Progress Notes (Signed)
Name: Alan Ford MRN: [id number] DOB: 13-Mar-1930    ADMISSION DATE:  06/08/2016 CONSULTATION DATE:  6/7  REFERRING MD :  Wendee Beavers ([facility])   CHIEF COMPLAINT:  Respiratory failure   BRIEF PATIENT DESCRIPTION: 82yo male former smoker with hx DM, chronic dCHF, CKD 3-4, OSA on CPAP, ILD (NSIP) and COPD on 3L home O2 followed by Dr. Lamonte Sakai as well as known L hilar lung mass.  Had recent admit (5/15-5/23) for NSTEMI with cath and RCA stent placed.  He returned 6/6 with c/o progressive dyspnea, orthopnea, cough.  He was admitted by [facility], CXR revealed slight increase in known pleural effusion and worsening pulmonary edema.  He was started on bipap and admitted to SDU where PCCM was consulted to assist with pulmonary needs. He underwent L thoracentesis with 1.5 L removed 6/7. Fluid was exudate by LDH only (marked pleural elevation to 1217, serum 188).   SIGNIFICANT EVENTS    STUDIES:  L thora 6/7: LDH ratio 6.5, Protein ratio 0.5. Exudate by LDH, Gram stain neg. Culture *, Glucose 90, Cholesterol *, pH 7.7,  ADA *  SUBJECTIVE: Breathing significantly improved per pt and no c/o pain.  He is currently sitting in chair on 10 HFNC no respiratory distress present.  VITAL SIGNS: Temp:  [97.6 F (36.4 C)-98.1 F (36.7 C)] 97.6 F (36.4 C) (06/09 0823) Pulse Rate:  [85-112] 103 (06/09 0823) Resp:  [15-36] 24 (06/09 0823) BP: (102-151)/(55-102) 149/85 mmHg (06/09 0823) SpO2:  [87 %-100 %] 95 % (06/09 0838)  PHYSICAL EXAMINATION: General:  Pleasant male, chronically ill appearing Neuro:  Awake, alert, appropriate, MAE, gen weakness  HEENT:  Mm moist, no JVD  Cardiovascular:  Mildly tachy, no murmur or gallop Lungs:  Diminished throughout resps even, non labored on 10L HFNC Abdomen:  +BS x4 Round, soft, non tender  Musculoskeletal:  Warm and dry, scant BLE edema    Recent Labs Lab 06/29/2016 2007 06/06/16 0534 06/07/16 0315  NA 133* 136 133*  K 5.1 5.0 4.8  CL 103 103 103  CO2 20* 18* 19*    BUN 55* 49* 39*  CREATININE 3.07* 2.90* 2.42*  GLUCOSE 169* 133* 115*    Recent Labs Lab 06/03/2016 2007 06/06/16 0534 06/06/16 1445  HGB 9.1* 8.9*  --   HCT 28.5* 28.1* 30.6*  WBC 10.4 11.0*  --   PLT 369 405*  --    Dg Chest Port 1 View  06/06/2016  CLINICAL DATA:  82 year old male status post left-sided thoracentesis. EXAM: PORTABLE CHEST 1 VIEW COMPARISON:  Chest x-ray 06/06/2016. FINDINGS: Incomplete visualization of the lower left hemithorax. Despite this limitation, the moderate left-sided pleural effusion appears slightly decreased in size compared to the prior examination. No appreciable pneumothorax. There continues to be atelectasis and/or consolidation left lung base. Right lung appears relatively clear, although there is widespread peribronchial cuffing throughout the lungs bilaterally. No cephalization of the pulmonary vasculature. The cardiac silhouette is partially obscured, but appears borderline to mildly enlarged. The patient is rotated to the right on today's exam, resulting in distortion of the mediastinal contours and reduced diagnostic sensitivity and specificity for mediastinal pathology. Atherosclerosis in the thoracic aorta. IMPRESSION: 1. Decreased size of moderate left pleural effusion following thoracentesis. No pneumothorax. 2. Persistent atelectasis and/or airspace consolidation in left lower lobe. 3. Diffuse peribronchial cuffing, concerning for an acute bronchitis. 4. Atherosclerosis. Electronically Signed   By: Vinnie Langton M.D.   On: 06/06/2016 15:49    ASSESSMENT / PLAN:   Acute on  chronic respiratory failure - multifactorial r/t enlarging L pleural effusion and pulmonary edema in setting decompensated diastolic dysfunction c/b acute on CKD 3-4 with underlying mod/severe COPD and ILD.  L pleural effusion s/p thora 6/7 (Exudate by LDH [1217] only). Interstitial Lung disease COPD without acute exacerbation OSA on CPAP - QHS CPAP - patient prefers home  mask, wife to bring.  - PRN BIPAP for dyspnea - Titrate FiO2 to maintain SpO2 88% to 92% - Follow pleural fluid studies as above - Scheduled and PRN BDs (to xopenex with tachy and NSVT runs) -Agree with diuresis  - Continue Cefepime  - Renal function improved from yesterday with diuresis, would be aggressive with this.  - Intermittent CXR - Incentive spirometry, flutter valve. -Cytology report pending will base decision to biopsy on results  Acute on chronic dCHF  AKI on CKD CAD - s/p recent RCA stent  PLAN -  - Per primary team  Pt updated about plan of care and questions answered.  Per primary team pt may transfer out of ICU today 6/9  Marda Stalker, [place]  Attending Note:  82 year old man with history of multiple falls, COPD and CAD who presents to the hospital with the SOB. CXR revealed a large left sided pleural effusion and pulmonary edema. On CXR that I reviewed myself a large and worsening left sided pleural effusion and pulmonary edema. I reviewed CT of the chest, enlarging lymph nodes noted. On exam, no BS on the left. Discussed with PCCM-NP and bedside RN. Decompensated overnight, required BiPAP, on HFNC at 10L.  Pleural effusion: - Thora done 6/7. - Fluid analysis exudative by LDH criteria. - Cytology still pending from 6/7. - Hold further diureses at this point.  - Repeat CXR in AM.  Pulmonary edema: - Holding lasix given renal function. - Treat CHF.  Acute respiratory failure: Due to pulmonary edema and pleural effusion. - Supplemental O2. - Diureses as above. Inocencio Homes done.  Hypoxemia: - Titrate O2 for sat of 88-92%, saturating >96% on this will wean O2 down. - Wean need an ambulatory desat study prior to discharge to determine need for home O2.  COPD: - Bronchodilators as  ordered. - Pulmonary hygienes.  Mediastinal LAN: enlarging on CT. - Will await cytology of pleural fluid prior to deciding on biopsy, spoke with patient he would want treatment.  Transfer to floors, PCCM will see again on Monday, please continue to trying bringing O2 down for sat of 88-92%.  Patient seen and examined, agree with above note. I dictated the care and orders written for this patient under my direction.  Rush Farmer, MD [phone]

## 2016-06-08 NOTE — Care Management Important Message (Signed)
Important Message  Patient Details  Name: Alan Ford MRN: [id number] Date of Birth: Feb 23, 1930   Medicare Important Message Given:  Yes    [name] Abena 06/08/2016, 10:51 AM

## 2016-06-09 LAB — BASIC METABOLIC PANEL
ANION GAP: 11 (ref 5–15)
BUN: 36 mg/dL — AB (ref 6–20)
CALCIUM: 8.6 mg/dL — AB (ref 8.9–10.3)
CO2: 19 mmol/L — ABNORMAL LOW (ref 22–32)
CREATININE: 2.42 mg/dL — AB (ref 0.61–1.24)
Chloride: 103 mmol/L (ref 101–111)
GFR calc Af Amer: 27 mL/min — ABNORMAL LOW (ref >60)
GFR, EST NON AFRICAN AMERICAN: 23 mL/min — AB (ref >60)
GLUCOSE: 154 mg/dL — AB (ref 65–99)
Potassium: 5.2 mmol/L — ABNORMAL HIGH (ref 3.5–5.1)
Sodium: 133 mmol/L — ABNORMAL LOW (ref 135–145)

## 2016-06-09 LAB — GLUCOSE, CAPILLARY
GLUCOSE-CAPILLARY: 154 mg/dL — AB (ref 65–99)
GLUCOSE-CAPILLARY: 153 mg/dL — AB (ref 65–99)
Glucose-Capillary: 200 mg/dL — ABNORMAL HIGH (ref 65–99)
Glucose-Capillary: 190 mg/dL — ABNORMAL HIGH (ref 65–99)
Glucose-Capillary: 152 mg/dL — ABNORMAL HIGH (ref 65–99)

## 2016-06-09 LAB — CHOLESTEROL, BODY FLUID: CHOL FL: 54 mg/dL

## 2016-06-09 LAB — CBC
HCT: 32 % — ABNORMAL LOW (ref 39.0–52.0)
Hemoglobin: 9.8 g/dL — ABNORMAL LOW (ref 13.0–17.0)
MCH: 30 pg (ref 26.0–34.0)
MCHC: 30.6 g/dL (ref 30.0–36.0)
MCV: 97.9 fL (ref 78.0–100.0)
PLATELETS: 354 10*3/uL (ref 150–400)
RBC: 3.27 MIL/uL — ABNORMAL LOW (ref 4.22–5.81)
RDW: 14.2 % (ref 11.5–15.5)
WBC: 13.2 10*3/uL — ABNORMAL HIGH (ref 4.0–10.5)

## 2016-06-09 LAB — ADENOSIDE DEAMINASE, PLEURAL FL: ADENOSIDE DEAMINASE, PLEURAL FL: 4 U/L (ref 0.0–9.4)

## 2016-06-09 MED ORDER — ZOLPIDEM TARTRATE 5 MG PO TABS
5.0000 mg | ORAL_TABLET | Freq: Every evening | ORAL | Status: DC | PRN
  Administered 2016-06-09 – 2016-06-18 (×8): 5 mg via ORAL
  Filled 2016-06-09 (×8): qty 1

## 2016-06-09 NOTE — Progress Notes (Signed)
Patient refused to wear BIPAP tonight. He was informed to call respiratory if he changes his mind.

## 2016-06-09 NOTE — Progress Notes (Signed)
PROGRESS NOTE    Alan Ford  RXV:[id number] DOB: [birth year] DOA: 06/27/2016 PCP: Mathews Argyle, MD   Brief Narrative:  82 yo male NSTEMI 2 weeks ago with cath/stent placed in RCA, copd on 3 liters  o2 at home, new lung mass uncertain diagnosis, CHF, afib comes in with progressive worsening sob and cough since he was discharged. Has been coughing which is worse while lying down and is sometimes productive. He reports worsening orthopnea and PND. Denies fevers or chills. Denies any worsening of his peripheral edema. He has intermittent chest pain for the past 3 weeks. No hemoptysis. He had a left pleural effusion and lung mass. He has seen his cardiologist and pulmonologist in the last couple weeks and it sounds like a thoracentesis was entertained but did not want to be done due To him being on plavix. Cxr shows worsening left pleural effusion and worsening hypoxic hypoxia.  Assessment & Plan:   Principal Problem:   Acute on chronic respiratory failure with hypoxia ([place]) - Consulted Pulmonologist for assistance with management - S/p thoracentesis with improvement in respiratory status afterwards.  Active Problems:   Chronic diastolic heart failure (HCC) - Pt on isordil and B blocker - on prior admission patient diagnosed with new onset systolic CHF with EF of 50-93 percent. Diagnostic cath showed severe 3VD. Reportedly the culprit lesion was RCA Patient also had more diffuse LAD disease and long bifurcation disease in OM. 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 Per cardiology notes on 06/01/2016 that finding and COPD made him non operative. Pt had artherectomy of RCA by Dr. Tamala Julian 05/17/16    Dyspnea   COPD (chronic obstructive pulmonary disease) (HCC)/ ILD (interstitial lung disease) ([place]) - pulm on board    Essential hypertension - continue current antihypertensive regimen    Coronary artery disease involving native coronary artery of native heart without angina  pectoris - aspirin    Lung mass   Pleural effusion on left - s/p thoracentesis    Acute on chronic renal failure ([place]) - monitor S creatinine   DVT prophylaxis: SCD's Code Status: full Family Communication: d/c patient directly Disposition Plan: Pending improvement in respiratory status and final recommendations by specialist involved.   Consultants:   Pulmonology   Procedures:   None   Antimicrobials: None   Subjective: Pt has no new complaints.  Objective: Filed Vitals:   06/09/16 0406 06/09/16 0700 06/09/16 0926 06/09/16 1200  BP: 142/69 124/88  130/65  Pulse: 105 109  92  Temp: 98.1 F (36.7 C) 97.7 F (36.5 C)  98.4 F (36.9 C)  TempSrc:  Oral  Oral  Resp: 20   20  Height:      Weight: 76.885 kg (169 lb 8 oz)     SpO2: 92% 97% 96% 97%    Intake/Output Summary (Last 24 hours) at 06/09/16 1518 Last data filed at 06/09/16 1344  Gross per 24 hour  Intake    600 ml  Output   1390 ml  Net   -790 ml   Filed Weights   06/06/16 0630 06/08/16 1617 06/09/16 0406  Weight: 84 kg (185 lb 3 oz) 77.384 kg (170 lb 9.6 oz) 76.885 kg (169 lb 8 oz)    Examination:  General exam: Appears calm and comfortable, resting comfortably in nad. Respiratory system: improved aeration to left lung, equal chest rise, no wheezes Cardiovascular system: S1 & S2 heard, RRR. Gastrointestinal system: Abdomen is nondistended, soft and nontender. No organomegaly or masses felt. Normal  bowel sounds heard. Central nervous system: Alert and oriented. No focal neurological deficits. Extremities: Symmetric, equal tone Skin: No rashes, lesions or ulcers Psychiatry: Judgement and insight appear normal. Mood & affect appropriate.   Data Reviewed: I have personally reviewed following labs and imaging studies  CBC:  Recent Labs Lab 06/04/2016 2007 06/06/16 0534 06/06/16 1445 06/09/16 0224  WBC 10.4 11.0*  --  13.2*  NEUTROABS 7.9*  --   --   --   HGB 9.1* 8.9*  --  9.8*  HCT  28.5* 28.1* 30.6* 32.0*  MCV 96.0 97.9  --  97.9  PLT 369 405*  --  664   Basic Metabolic Panel:  Recent Labs Lab 06/22/2016 2007 06/06/16 0534 06/07/16 0315 06/09/16 0224  NA 133* 136 133* 133*  K 5.1 5.0 4.8 5.2*  CL 103 103 103 103  CO2 20* 18* 19* 19*  GLUCOSE 169* 133* 115* 154*  BUN 55* 49* 39* 36*  CREATININE 3.07* 2.90* 2.42* 2.42*  CALCIUM 8.6* 8.4* 8.4* 8.6*  MG  --   --  2.6*  --    GFR: Estimated Creatinine Clearance: 23.8 mL/min (by C-G formula based on Cr of 2.42). Liver Function Tests:  Recent Labs Lab 06/06/16 1445  PROT 6.1*   No results for input(s): LIPASE, AMYLASE in the last 168 hours. No results for input(s): AMMONIA in the last 168 hours. Coagulation Profile: No results for input(s): INR, PROTIME in the last 168 hours. Cardiac Enzymes:  Recent Labs Lab 06/12/2016 2007 06/10/2016 2343 06/06/16 0534 06/06/16 1127  TROPONINI 0.03 <0.03 0.03 0.03   BNP (last 3 results)  Recent Labs  05/14/16 1012  PROBNP 1454.0*   HbA1C: No results for input(s): HGBA1C in the last 72 hours. CBG:  Recent Labs Lab 06/08/16 1137 06/08/16 1614 06/08/16 2056 06/09/16 0539 06/09/16 1125  GLUCAP 201* 138* 197* 154* 200*   Lipid Profile: No results for input(s): CHOL, HDL, LDLCALC, TRIG, CHOLHDL, LDLDIRECT in the last 72 hours. Thyroid Function Tests: No results for input(s): TSH, T4TOTAL, FREET4, T3FREE, THYROIDAB in the last 72 hours. Anemia Panel: No results for input(s): VITAMINB12, FOLATE, FERRITIN, TIBC, IRON, RETICCTPCT in the last 72 hours. Sepsis Labs: No results for input(s): PROCALCITON, LATICACIDVEN in the last 168 hours.  Recent Results (from the past 240 hour(s))  Blood culture (routine x 2)     Status: None (Preliminary result)   Collection Time: 06/08/2016 10:18 PM  Result Value Ref Range Status   Specimen Description BLOOD LEFT ARM  Final   Special Requests IN PEDIATRIC BOTTLE 3CC  Final   Culture NO GROWTH 4 DAYS  Final   Report  Status PENDING  Incomplete  Blood culture (routine x 2)     Status: None (Preliminary result)   Collection Time: 06/24/2016 10:23 PM  Result Value Ref Range Status   Specimen Description BLOOD LEFT HAND  Final   Special Requests IN PEDIATRIC BOTTLE 3CC  Final   Culture NO GROWTH 4 DAYS  Final   Report Status PENDING  Incomplete  Culture, body fluid-bottle     Status: None (Preliminary result)   Collection Time: 06/06/16  2:43 PM  Result Value Ref Range Status   Specimen Description PLEURAL LEFT  Final   Special Requests AEROBIC BOTTLE ONLY 6CC  Final   Culture NO GROWTH 3 DAYS  Final   Report Status PENDING  Incomplete  Gram stain     Status: None   Collection Time: 06/06/16  2:43 PM  Result  Value Ref Range Status   Specimen Description PLEURAL LEFT  Final   Special Requests NONE  Final   Gram Stain   Final    ABUNDANT WBC PRESENT,BOTH PMN AND MONONUCLEAR NO ORGANISMS SEEN    Report Status 06/06/2016 FINAL  Final         Radiology Studies: No results found.      Scheduled Meds: . antiseptic oral rinse  7 mL Mouth Rinse q12n4p  . aspirin EC  81 mg Oral QHS  . budesonide (PULMICORT) nebulizer solution  0.25 mg Nebulization Q6H  . carvedilol  3.125 mg Oral BID WC  . ceFEPime (MAXIPIME) IV  1 g Intravenous Q24H  . chlorhexidine  15 mL Mouth Rinse BID  . clopidogrel  75 mg Oral Q breakfast  . insulin aspart  0-9 Units Subcutaneous TID WC & HS  . ipratropium  0.5 mg Nebulization Q6H  . isosorbide dinitrate  10 mg Oral BID  . levalbuterol  0.63 mg Nebulization Q6H  . ranolazine  500 mg Oral BID  . sodium chloride flush  3 mL Intravenous Q12H  . sodium chloride flush  3 mL Intravenous Q12H  . vancomycin  1,000 mg Intravenous Q24H   Continuous Infusions:    LOS: 4 days    Time spent: > 35 minutes   Velvet Bathe, MD [facility] Pager [phone]  If 7PM-7AM, please contact night-coverage [email or web address] Password [email or web address] 06/09/2016, 3:18 PM

## 2016-06-09 NOTE — Progress Notes (Signed)
Physical Therapy Evaluation Patient Details Name: Alan Ford MRN: [id number] DOB: 04-25-1930 Today's Date: 06/09/2016   History of Present Illness  82 y.o. male with known diabetes, HTN, HLD, CHF, emphysema/COPD, CKD, A. fib, OSA on CPAP, diastolic CHF, NSTEMI, underwent cath 5/16.  Returned 6/6 with c/o progressive dyspnea, orthopnea, cough, CXR revealed increase in known pleural effusion and worsening pulmonary edema. Started on bipap and admitted to SDU where PCCM was consulted to assist with pulmonary needs. L thoracentesis with 1.5 L removed 6/7.    Clinical Impression  Patient presents with rapid desaturation during activity, prolonged recovery times, and impaired balance with multiple near losses of balance during therapy evaluation.  Patient is motivated, but has multiple co-morbid issues complicating care.  MIN assist overall with use of walker, patient was formerly independent without device at home.  Patient will benefit from skilled PT services while in acute care.    Follow Up Recommendations SNF    Equipment Recommendations  Rolling walker with 5" wheels    Recommendations for Other Services       Precautions / Restrictions Precautions Precautions: Fall Precaution Comments: monitor O2 Restrictions Weight Bearing Restrictions: No      Mobility  Bed Mobility Overal bed mobility: Needs Assistance Bed Mobility: Sit to Supine       Sit to supine: Min assist      Transfers Overall transfer level: Needs assistance Equipment used: Rolling walker (2 wheeled)   Sit to Stand: Min guard         General transfer comment: No cues or physical assist needed.  Pt mildly unsteady w/ transfer.  Ambulation/Gait Ambulation/Gait assistance: Min assist;Min guard Ambulation Distance (Feet): 75 Feet Assistive device: Rolling walker (2 wheeled) Gait Pattern/deviations: Decreased stride length;Narrow base of support Gait velocity: decreased   General Gait Details: Pt.  unsteady with gait, needed MIN assist to recover from 2 near losses of balance. Pt O2 sats dropped to 84% with 3 litersO2. Standing rest break and pursed lip breathing for 3-4 mins to return to 90%.   Stairs            Wheelchair Mobility    Modified Rankin (Stroke Patients Only)       Balance Overall balance assessment: Needs assistance   Sitting balance-Leahy Scale: Normal       Standing balance-Leahy Scale: Fair                               Pertinent Vitals/Pain Pain Assessment: No/denies pain    Home Living Family/patient expects to be discharged to:: Private residence Living Arrangements: Spouse/significant other Available Help at Discharge: Family;Available 24 hours/day Type of Home: House Home Access: Level entry     Home Layout: One level Home Equipment: Walker - 4 wheels Additional Comments: pt on 3 L O2 continuous at home    Prior Function Level of Independence:  (without device normally.)               Hand Dominance   Dominant Hand: Right    Extremity/Trunk Assessment               Lower Extremity Assessment: Overall WFL for tasks assessed      Cervical / Trunk Assessment: Kyphotic  Communication   Communication: No difficulties  Cognition Arousal/Alertness: Awake/alert Behavior During Therapy: WFL for tasks assessed/performed Overall Cognitive Status: Within Functional Limits for tasks assessed  General Comments      Exercises Other Exercises Other Exercises: Pursed lip breathing.  Demonstrated to pt and cues throughout session.      Assessment/Plan    PT Assessment Patient needs continued PT services  PT Diagnosis Abnormality of gait   PT Problem List Cardiopulmonary status limiting activity;Decreased balance;Decreased activity tolerance;Decreased mobility  PT Treatment Interventions Gait training;Functional mobility training;Therapeutic activities;Therapeutic  exercise;Balance training;Patient/family education   PT Goals (Current goals can be found in the Care Plan section) Acute Rehab PT Goals Patient Stated Goal: return home PT Goal Formulation: With patient Time For Goal Achievement: 06/23/16 Potential to Achieve Goals: Good    Frequency Min 3X/week   Barriers to discharge        Co-evaluation               End of Session Equipment Utilized During Treatment: Gait belt;Oxygen Activity Tolerance:  (desaturation) Patient left: in bed;with call bell/phone within reach;with bed alarm set Nurse Communication: Mobility status         Time: 1315-1405 PT Time Calculation (min) (ACUTE ONLY): 50 min   Charges:   PT Evaluation $PT Eval Moderate Complexity: 1 Procedure PT Treatments $Gait Training: 8-22 mins $Neuromuscular Re-education: 8-22 mins   PT G Codes:        Shelma Eiben L 2016-06-23, 2:59 PM

## 2016-06-10 ENCOUNTER — Inpatient Hospital Stay (HOSPITAL_COMMUNITY): Payer: Medicare Other

## 2016-06-10 LAB — CULTURE, BLOOD (ROUTINE X 2)
CULTURE: NO GROWTH
CULTURE: NO GROWTH

## 2016-06-10 LAB — BASIC METABOLIC PANEL
ANION GAP: 13 (ref 5–15)
BUN: 39 mg/dL — ABNORMAL HIGH (ref 6–20)
CALCIUM: 8.6 mg/dL — AB (ref 8.9–10.3)
CO2: 16 mmol/L — ABNORMAL LOW (ref 22–32)
Chloride: 103 mmol/L (ref 101–111)
Creatinine, Ser: 2.61 mg/dL — ABNORMAL HIGH (ref 0.61–1.24)
GFR, EST AFRICAN AMERICAN: 24 mL/min — AB (ref >60)
GFR, EST NON AFRICAN AMERICAN: 21 mL/min — AB (ref >60)
GLUCOSE: 150 mg/dL — AB (ref 65–99)
POTASSIUM: 5.4 mmol/L — AB (ref 3.5–5.1)
Sodium: 132 mmol/L — ABNORMAL LOW (ref 135–145)

## 2016-06-10 LAB — CBC
HEMATOCRIT: 31.7 % — AB (ref 39.0–52.0)
Hemoglobin: 9.8 g/dL — ABNORMAL LOW (ref 13.0–17.0)
MCH: 29.7 pg (ref 26.0–34.0)
MCHC: 30.9 g/dL (ref 30.0–36.0)
MCV: 96.1 fL (ref 78.0–100.0)
PLATELETS: 333 10*3/uL (ref 150–400)
RBC: 3.3 MIL/uL — AB (ref 4.22–5.81)
RDW: 14 % (ref 11.5–15.5)
WBC: 15 10*3/uL — AB (ref 4.0–10.5)

## 2016-06-10 LAB — GLUCOSE, CAPILLARY
GLUCOSE-CAPILLARY: 164 mg/dL — AB (ref 65–99)
GLUCOSE-CAPILLARY: 169 mg/dL — AB (ref 65–99)
Glucose-Capillary: 155 mg/dL — ABNORMAL HIGH (ref 65–99)
Glucose-Capillary: 138 mg/dL — ABNORMAL HIGH (ref 65–99)

## 2016-06-10 MED ORDER — FUROSEMIDE 10 MG/ML IJ SOLN
60.0000 mg | Freq: Once | INTRAMUSCULAR | Status: AC
  Administered 2016-06-10: 60 mg via INTRAVENOUS
  Filled 2016-06-10: qty 6

## 2016-06-10 NOTE — Progress Notes (Signed)
Pt refusing CPAP at this time.

## 2016-06-10 NOTE — Progress Notes (Signed)
Called by Primary RN to evaluate patient for C/O SOB.  Upon my arrival to patients room, RN at bedside.  Patient in bed, receiving neb treatment.  RN states she found him sitting on the side of the bed with labored breathing.  Patient states he is breathing a little easier now, but has been having a hard time all night.  He looks tired, states he did not sleep at all last night even though he received ambien at 2145.  Breath Sounds Clear on right, diminished on left.  Sats 95% on neb treatment, HR 102, RR 20-deep breaths.  Skin is warm and dry.  Recommend calling MD and update on findings.  RN to call if assistance needed

## 2016-06-10 NOTE — Progress Notes (Signed)
Pharmacy Antibiotic Note  Alan Ford is a 82 y.o. male admitted on 06/12/2016 with pneumonia.  Pharmacy has been consulted for vancomycin and cefepime dosing. SCr has been variable but around 2.5, CrCl estimated to be ~ 25 ml/min  Plan: - Continue Vancomycin 1g IV q24h - Continue cefepime 1g IV q24h - F/u renal fxn, C&S, and clinical progression - Will f/u tomorrow and if planning to continue vancomycin, check trough   Height: 5' 10.5" (179.1 cm) Weight: 170 lb 14.4 oz (77.52 kg) (a scale) IBW/kg (Calculated) : 74.15  Temp (24hrs), Avg:98.2 F (36.8 C), Min:98 F (36.7 C), Max:98.4 F (36.9 C)   Recent Labs Lab 06/26/2016 2007 06/06/16 0534 06/07/16 0315 06/09/16 0224 06/10/16 0409  WBC 10.4 11.0*  --  13.2* 15.0*  CREATININE 3.07* 2.90* 2.42* 2.42* 2.61*    Estimated Creatinine Clearance: 22.1 mL/min (by C-G formula based on Cr of 2.61).    Allergies  Allergen Reactions  . Hydrocodone Other (See Comments)    NIghtmares, hallucinations    Antimicrobials this admission: Vancomycin 6/6>> Cefepime 6/6>>  Dose adjustments this admission: 6/8: vancomycin increased to 1g IV q24h  Microbiology results: 6/6 BCx: ngtd 6/7 pleural fluid: no organisms seen  Thank you for allowing pharmacy to be a part of this patient's care.  Dimitri Ped, PharmD. PGY-1 Pharmacy Resident Pager: [phone] 06/10/2016 11:31 AM

## 2016-06-10 NOTE — Progress Notes (Signed)
This am patient having shortness of breath/ labour breathing while sitting up in the chair, alert and oriented, pt. Look tired, and stated he did not sleep all night. V/S stable,  wheezes , Put patient back in the bed, breathing treatment given. Patient calm a little bit after treatment. MD notified. See manage orders for new orders. Patient is resting well now, will continue to monitor patient.

## 2016-06-10 NOTE — Progress Notes (Signed)
PROGRESS NOTE    Alan Ford  TKZ:[id number] DOB: 1930/02/03 DOA: 06/11/2016 PCP: Mathews Argyle, MD   Brief Narrative:  82 yo male NSTEMI 2 weeks ago with cath/stent placed in RCA, copd on 3 liters [place] o2 at home, new lung mass uncertain diagnosis, CHF, afib comes in with progressive worsening sob and cough since he was discharged. Has been coughing which is worse while lying down and is sometimes productive. He reports worsening orthopnea and PND. Denies fevers or chills. Denies any worsening of his peripheral edema. He has intermittent chest pain for the past 3 weeks. No hemoptysis. He had a left pleural effusion and lung mass. He has seen his cardiologist and pulmonologist in the last couple weeks and it sounds like a thoracentesis was entertained but did not want to be done due To him being on plavix. Cxr shows worsening left pleural effusion and worsening hypoxic hypoxia.  Assessment & Plan:   Principal Problem:   Acute on chronic respiratory failure with hypoxia ([place]) - Consulted Pulmonologist for assistance with management - S/p thoracentesis with improvement in respiratory status afterwards. - Pt having increased wob and chest x ray obtained which reported pleural effusion worsening. Will add lasix 60 mg IV once.  Active Problems:   Chronic diastolic heart failure (HCC) - Pt on isordil and B blocker - on prior admission patient diagnosed with new onset systolic CHF with EF of 57-32 percent. Diagnostic cath showed severe 3VD. Reportedly the culprit lesion was RCA Patient also had more diffuse LAD disease and long bifurcation disease in OM. 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 Per cardiology notes on 06/01/2016 that finding and COPD made him non operative. Pt had artherectomy of RCA by Dr. Tamala Julian 05/17/16    Dyspnea   COPD (chronic obstructive pulmonary disease) (HCC)/ ILD (interstitial lung disease) ([place]) - pulm on board    Essential hypertension - continue  current antihypertensive regimen    Coronary artery disease involving native coronary artery of native heart without angina pectoris - aspirin    Lung mass   Pleural effusion on left - s/p thoracentesis    Acute on chronic renal failure ([place]) - monitor S creatinine   DVT prophylaxis: SCD's Code Status: full Family Communication: d/c patient directly Disposition Plan: Pending improvement in respiratory status and final recommendations by specialist involved.   Consultants:   Pulmonology   Procedures:   None   Antimicrobials: None   Subjective: Pt has had increased in WOB reportedly  Objective: Filed Vitals:   06/09/16 2106 06/10/16 0417 06/10/16 0845 06/10/16 0857  BP: 132/76 132/67 147/75 134/69  Pulse: 102 108 106 98  Temp: 98.3 F (36.8 C) 98 F (36.7 C)    TempSrc: Oral Oral    Resp: '20 20 26 24  '$ Height:      Weight:  77.52 kg (170 lb 14.4 oz)    SpO2: 92% 91% 95% 94%    Intake/Output Summary (Last 24 hours) at 06/10/16 1340 Last data filed at 06/10/16 0615  Gross per 24 hour  Intake    340 ml  Output    500 ml  Net   -160 ml   Filed Weights   06/08/16 1617 06/09/16 0406 06/10/16 0417  Weight: 77.384 kg (170 lb 9.6 oz) 76.885 kg (169 lb 8 oz) 77.52 kg (170 lb 14.4 oz)    Examination:  General exam: Appears calm and comfortable, resting comfortably in nad. Respiratory system: decreased breath sound over left lung field at bases,  equal chest rise, no wheezes Cardiovascular system: S1 & S2 heard, RRR. Gastrointestinal system: Abdomen is nondistended, soft and nontender. No organomegaly or masses felt. Normal bowel sounds heard. Central nervous system: Alert and oriented. No focal neurological deficits. Extremities: Symmetric, equal tone Skin: No rashes, lesions or ulcers Psychiatry: Judgement and insight appear normal. Mood & affect appropriate.   Data Reviewed: I have personally reviewed following labs and imaging studies  CBC:  Recent  Labs Lab 06/18/2016 2007 06/06/16 0534 06/06/16 1445 06/09/16 0224 06/10/16 0409  WBC 10.4 11.0*  --  13.2* 15.0*  NEUTROABS 7.9*  --   --   --   --   HGB 9.1* 8.9*  --  9.8* 9.8*  HCT 28.5* 28.1* 30.6* 32.0* 31.7*  MCV 96.0 97.9  --  97.9 96.1  PLT 369 405*  --  354 220   Basic Metabolic Panel:  Recent Labs Lab 06/22/2016 2007 06/06/16 0534 06/07/16 0315 06/09/16 0224 06/10/16 0409  NA 133* 136 133* 133* 132*  K 5.1 5.0 4.8 5.2* 5.4*  CL 103 103 103 103 103  CO2 20* 18* 19* 19* 16*  GLUCOSE 169* 133* 115* 154* 150*  BUN 55* 49* 39* 36* 39*  CREATININE 3.07* 2.90* 2.42* 2.42* 2.61*  CALCIUM 8.6* 8.4* 8.4* 8.6* 8.6*  MG  --   --  2.6*  --   --    GFR: Estimated Creatinine Clearance: 22.1 mL/min (by C-G formula based on Cr of 2.61). Liver Function Tests:  Recent Labs Lab 06/06/16 1445  PROT 6.1*   No results for input(s): LIPASE, AMYLASE in the last 168 hours. No results for input(s): AMMONIA in the last 168 hours. Coagulation Profile: No results for input(s): INR, PROTIME in the last 168 hours. Cardiac Enzymes:  Recent Labs Lab 06/09/2016 2007 06/06/2016 2343 06/06/16 0534 06/06/16 1127  TROPONINI 0.03 <0.03 0.03 0.03   BNP (last 3 results)  Recent Labs  05/14/16 1012  PROBNP 1454.0*   HbA1C: No results for input(s): HGBA1C in the last 72 hours. CBG:  Recent Labs Lab 06/09/16 1642 06/09/16 1959 06/09/16 2223 06/10/16 0613 06/10/16 1122  GLUCAP 190* 153* 152* 164* 155*   Lipid Profile: No results for input(s): CHOL, HDL, LDLCALC, TRIG, CHOLHDL, LDLDIRECT in the last 72 hours. Thyroid Function Tests: No results for input(s): TSH, T4TOTAL, FREET4, T3FREE, THYROIDAB in the last 72 hours. Anemia Panel: No results for input(s): VITAMINB12, FOLATE, FERRITIN, TIBC, IRON, RETICCTPCT in the last 72 hours. Sepsis Labs: No results for input(s): PROCALCITON, LATICACIDVEN in the last 168 hours.  Recent Results (from the past 240 hour(s))  Blood culture  (routine x 2)     Status: None   Collection Time: 06/28/2016 10:18 PM  Result Value Ref Range Status   Specimen Description BLOOD LEFT ARM  Final   Special Requests IN PEDIATRIC BOTTLE 3CC  Final   Culture NO GROWTH 5 DAYS  Final   Report Status 06/10/2016 FINAL  Final  Blood culture (routine x 2)     Status: None   Collection Time: 06/04/2016 10:23 PM  Result Value Ref Range Status   Specimen Description BLOOD LEFT HAND  Final   Special Requests IN PEDIATRIC BOTTLE 3CC  Final   Culture NO GROWTH 5 DAYS  Final   Report Status 06/10/2016 FINAL  Final  Culture, body fluid-bottle     Status: None (Preliminary result)   Collection Time: 06/06/16  2:43 PM  Result Value Ref Range Status   Specimen Description PLEURAL LEFT  Final   Special Requests AEROBIC BOTTLE ONLY 6CC  Final   Culture NO GROWTH 4 DAYS  Final   Report Status PENDING  Incomplete  Gram stain     Status: None   Collection Time: 06/06/16  2:43 PM  Result Value Ref Range Status   Specimen Description PLEURAL LEFT  Final   Special Requests NONE  Final   Gram Stain   Final    ABUNDANT WBC PRESENT,BOTH PMN AND MONONUCLEAR NO ORGANISMS SEEN    Report Status 06/06/2016 FINAL  Final         Radiology Studies: Dg Chest Port 1 View  06/10/2016  CLINICAL DATA:  Shortness of Breath EXAM: PORTABLE CHEST 1 VIEW COMPARISON:  06/06/2016 FINDINGS: Cardiomediastinal silhouette is stable. There is moderate left pleural effusion increased from prior exam with left basilar atelectasis or infiltrate. Central mild vascular congestion without convincing pulmonary edema. Atherosclerotic calcifications of thoracic aorta again noted. IMPRESSION: Moderate left pleural effusion increased from prior exam with left basilar atelectasis or infiltrate. Central mild vascular congestion without convincing pulmonary edema. Electronically Signed   By: Lahoma Crocker M.D.   On: 06/10/2016 09:49        Scheduled Meds: . antiseptic oral rinse  7 mL Mouth  Rinse q12n4p  . aspirin EC  81 mg Oral QHS  . budesonide (PULMICORT) nebulizer solution  0.25 mg Nebulization Q6H  . carvedilol  3.125 mg Oral BID WC  . ceFEPime (MAXIPIME) IV  1 g Intravenous Q24H  . chlorhexidine  15 mL Mouth Rinse BID  . clopidogrel  75 mg Oral Q breakfast  . insulin aspart  0-9 Units Subcutaneous TID WC & HS  . ipratropium  0.5 mg Nebulization Q6H  . isosorbide dinitrate  10 mg Oral BID  . levalbuterol  0.63 mg Nebulization Q6H  . ranolazine  500 mg Oral BID  . sodium chloride flush  3 mL Intravenous Q12H  . sodium chloride flush  3 mL Intravenous Q12H  . vancomycin  1,000 mg Intravenous Q24H   Continuous Infusions:    LOS: 5 days    Time spent: > 35 minutes   Velvet Bathe, MD [facility] Pager [phone]  If 7PM-7AM, please contact night-coverage [email or web address] Password [email or web address] 06/10/2016, 1:40 PM

## 2016-06-10 NOTE — Consult Note (Addendum)
WOC wound consult note Reason for Consult: Consult requested for buttocks.  Pt states he has been frequently incontinent in the past but is wearing a condom cath at this time. Wound type: Bilat buttocks/sacrum areas are red, macerated, and peeling with partial thickness skin loss in patchy areas. Pressure Ulcer POA: This is NOT a pressure injury Measurement: 3X7cm across the affected areas Dressing procedure/placement/frequency: Barrier cream to protect and repel moisture. Air mattress to increase airflow and promote healing. Please re-consult if further assistance is needed.  Thank-you,  Julien Girt MSN, [place], [place], [place], [place]

## 2016-06-11 ENCOUNTER — Ambulatory Visit: Payer: Medicare Other | Admitting: Adult Health

## 2016-06-11 LAB — BASIC METABOLIC PANEL
ANION GAP: 11 (ref 5–15)
BUN: 37 mg/dL — ABNORMAL HIGH (ref 6–20)
CO2: 21 mmol/L — ABNORMAL LOW (ref 22–32)
Calcium: 8.5 mg/dL — ABNORMAL LOW (ref 8.9–10.3)
Chloride: 99 mmol/L — ABNORMAL LOW (ref 101–111)
Creatinine, Ser: 2.46 mg/dL — ABNORMAL HIGH (ref 0.61–1.24)
GFR, EST AFRICAN AMERICAN: 26 mL/min — AB (ref >60)
GFR, EST NON AFRICAN AMERICAN: 23 mL/min — AB (ref >60)
Glucose, Bld: 265 mg/dL — ABNORMAL HIGH (ref 65–99)
POTASSIUM: 5 mmol/L (ref 3.5–5.1)
SODIUM: 131 mmol/L — AB (ref 135–145)

## 2016-06-11 LAB — CBC
HCT: 30.2 % — ABNORMAL LOW (ref 39.0–52.0)
Hemoglobin: 9.4 g/dL — ABNORMAL LOW (ref 13.0–17.0)
MCH: 29.7 pg (ref 26.0–34.0)
MCHC: 31.1 g/dL (ref 30.0–36.0)
MCV: 95.3 fL (ref 78.0–100.0)
Platelets: 326 10*3/uL (ref 150–400)
RBC: 3.17 MIL/uL — AB (ref 4.22–5.81)
RDW: 14 % (ref 11.5–15.5)
WBC: 13.5 10*3/uL — ABNORMAL HIGH (ref 4.0–10.5)

## 2016-06-11 LAB — GLUCOSE, CAPILLARY
GLUCOSE-CAPILLARY: 155 mg/dL — AB (ref 65–99)
GLUCOSE-CAPILLARY: 115 mg/dL — AB (ref 65–99)
GLUCOSE-CAPILLARY: 196 mg/dL — AB (ref 65–99)
Glucose-Capillary: 148 mg/dL — ABNORMAL HIGH (ref 65–99)
Glucose-Capillary: 305 mg/dL — ABNORMAL HIGH (ref 65–99)

## 2016-06-11 LAB — CULTURE, BODY FLUID-BOTTLE: CULTURE: NO GROWTH

## 2016-06-11 MED ORDER — LEVALBUTEROL HCL 0.63 MG/3ML IN NEBU
0.6300 mg | INHALATION_SOLUTION | Freq: Three times a day (TID) | RESPIRATORY_TRACT | Status: DC
  Administered 2016-06-11 – 2016-06-19 (×23): 0.63 mg via RESPIRATORY_TRACT
  Filled 2016-06-11 (×27): qty 3

## 2016-06-11 MED ORDER — BUDESONIDE 0.5 MG/2ML IN SUSP
0.5000 mg | Freq: Two times a day (BID) | RESPIRATORY_TRACT | Status: DC
  Administered 2016-06-11 – 2016-06-20 (×17): 0.5 mg via RESPIRATORY_TRACT
  Filled 2016-06-11 (×19): qty 2

## 2016-06-11 MED ORDER — SODIUM POLYSTYRENE SULFONATE 15 GM/60ML PO SUSP
15.0000 g | Freq: Once | ORAL | Status: AC
  Administered 2016-06-11: 15 g via ORAL
  Filled 2016-06-11: qty 60

## 2016-06-11 MED ORDER — IPRATROPIUM BROMIDE 0.02 % IN SOLN
0.5000 mg | Freq: Three times a day (TID) | RESPIRATORY_TRACT | Status: DC
  Administered 2016-06-11 – 2016-06-19 (×23): 0.5 mg via RESPIRATORY_TRACT
  Filled 2016-06-11 (×26): qty 2.5

## 2016-06-11 MED ORDER — HYDROCODONE-ACETAMINOPHEN 5-325 MG PO TABS
1.0000 | ORAL_TABLET | Freq: Every day | ORAL | Status: DC | PRN
  Administered 2016-06-11 – 2016-06-14 (×5): 1 via ORAL
  Filled 2016-06-11 (×6): qty 1

## 2016-06-11 NOTE — Clinical Social Work Note (Signed)
Clinical Social Work Assessment  Patient Details  Name: Alan Ford MRN: [id number] Date of Birth: 1930/07/18  Date of referral:  06/11/16               Reason for consult:  Facility Placement, Discharge Planning                Permission sought to share information with:  Facility Sport and exercise psychologist, Family Supports Permission granted to share information::  Yes, Verbal Permission Granted  Name::     Health and safety inspector::  SNF's  Relationship::  Wife  Contact Information:  [phone]  Housing/Transportation Living arrangements for the past 2 months:  Single Family Home Source of Information:  Spouse Patient Interpreter Needed:  None Criminal Activity/Legal Involvement Pertinent to Current Situation/Hospitalization:  No - Comment as needed Significant Relationships:  Spouse Lives with:  Spouse Do you feel safe going back to the place where you live?  Yes Need for family participation in patient care:  Yes (Comment)  Care giving concerns:  PT recommending SNF at discharge.   Social Worker assessment / plan:  CSW met with patient. No supports at bedside. CSW introduced role and explained that discharge planning would be discussed. CSW provided SNF list. Patient agreeable. No further concerns. CSW encouraged patient to contact CSW as needed. CSW will continue to follow patient and facilitate discharge to SNF once medically stable.  Employment status:  Retired Nurse, adult PT Recommendations:  [place] / Referral to community resources:  [place]  Patient/Family's Response to care:  Patient agreeable to SNF placement. Patient's wife supportive and involved in patient's care. Patient polite and appreciated social work intervention.  Patient/Family's Understanding of and Emotional Response to Diagnosis, Current Treatment, and Prognosis:  Patient knowledgeable of medical interventions and aware of plan for  discharge to SNF once medically stable.  Emotional Assessment Appearance:  Appears stated age Attitude/Demeanor/Rapport:   (Pleasant) Affect (typically observed):  Accepting, Appropriate, Calm, Pleasant Orientation:  Oriented to Self, Oriented to Place, Oriented to  Time, Oriented to Situation Alcohol / Substance use:  Never Used Psych involvement (Current and /or in the community):  No (Comment)  Discharge Needs  Concerns to be addressed:  Care Coordination Readmission within the last 30 days:  Yes Current discharge risk:  Dependent with Mobility Barriers to Discharge:  No Barriers Identified   Candie Chroman, LCSW 06/11/2016, 11:08 AM

## 2016-06-11 NOTE — Progress Notes (Signed)
PROGRESS NOTE    Alan Ford  RSW:[id number] DOB: 03/02/1930 DOA: 05/31/2016 PCP: Mathews Argyle, MD   Brief Narrative:  82 yo male NSTEMI 2 weeks ago with cath/stent placed in RCA, copd on 3 liters [place] o2 at home, new lung mass uncertain diagnosis, CHF, afib comes in with progressive worsening sob and cough since he was discharged. Has been coughing which is worse while lying down and is sometimes productive. He reports worsening orthopnea and PND. Denies fevers or chills. Denies any worsening of his peripheral edema. He has intermittent chest pain for the past 3 weeks. No hemoptysis. He had a left pleural effusion and lung mass. He has seen his cardiologist and pulmonologist in the last couple weeks and it sounds like a thoracentesis was entertained but did not want to be done due To him being on plavix. Cxr shows worsening left pleural effusion and worsening hypoxic hypoxia.  Assessment & Plan:   Principal Problem:   Acute on chronic respiratory failure with hypoxia ([place]) - Consulted Pulmonologist for assistance with management - S/p thoracentesis with improvement in respiratory status afterwards. - improved with lasix 60 mg IV yesterday.   Active Problems:   Chronic diastolic heart failure (HCC) - Pt on isordil and B blocker - on prior admission patient diagnosed with new onset systolic CHF with EF of 09-38 percent. Diagnostic cath showed severe 3VD. Reportedly the culprit lesion was RCA Patient also had more diffuse LAD disease and long bifurcation disease in OM. 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 Per cardiology notes on 06/01/2016 that finding and COPD made him non operative. Pt had artherectomy of RCA by Dr. Tamala Julian 05/17/16    Dyspnea   COPD (chronic obstructive pulmonary disease) (HCC)/ ILD (interstitial lung disease) ([place]) - pulm on board    Essential hypertension - continue current antihypertensive regimen    Coronary artery disease involving native  coronary artery of native heart without angina pectoris - aspirin    Lung mass   Pleural effusion on left - s/p thoracentesis    Acute on chronic renal failure ([place]) - monitor S creatinine   DVT prophylaxis: SCD's Code Status: full Family Communication: d/c patient directly Disposition Plan: Pending improvement in respiratory status and final recommendations by specialist involved.   Consultants:   Pulmonology   Procedures:   None   Antimicrobials: None   Subjective: Pt feels less short of breath today.   Objective: Filed Vitals:   06/10/16 1636 06/10/16 2216 06/11/16 0601 06/11/16 1152  BP: 130/68 133/72 143/75 127/61  Pulse: 102 102 98 102  Temp: 98.3 F (36.8 C) 98.2 F (36.8 C) 99 F (37.2 C) 97.3 F (36.3 C)  TempSrc: Oral Oral Oral Oral  Resp:  '20 22 20  '$ Height:      Weight:   76.386 kg (168 lb 6.4 oz)   SpO2: 93% 94% 92% 93%    Intake/Output Summary (Last 24 hours) at 06/11/16 1454 Last data filed at 06/11/16 1357  Gross per 24 hour  Intake    650 ml  Output   1275 ml  Net   -625 ml   Filed Weights   06/09/16 0406 06/10/16 0417 06/11/16 0601  Weight: 76.885 kg (169 lb 8 oz) 77.52 kg (170 lb 14.4 oz) 76.386 kg (168 lb 6.4 oz)    Examination:  General exam: Alert and awake, resting comfortably in nad. Respiratory system: decreased breath sound over left lung field at bases, equal chest rise, no wheezes Cardiovascular system: S1 &  S2 heard, RRR. Gastrointestinal system: Abdomen is nondistended, soft and nontender. No organomegaly or masses felt. Normal bowel sounds heard. Central nervous system: Alert and oriented. No focal neurological deficits. Extremities: Symmetric, equal tone Skin: No rashes, lesions or ulcers Psychiatry: Judgement and insight appear normal. Mood & affect appropriate.   Data Reviewed: I have personally reviewed following labs and imaging studies  CBC:  Recent Labs Lab 06/06/2016 2007 06/06/16 0534 06/06/16 1445  06/09/16 0224 06/10/16 0409 06/11/16 1050  WBC 10.4 11.0*  --  13.2* 15.0* 13.5*  NEUTROABS 7.9*  --   --   --   --   --   HGB 9.1* 8.9*  --  9.8* 9.8* 9.4*  HCT 28.5* 28.1* 30.6* 32.0* 31.7* 30.2*  MCV 96.0 97.9  --  97.9 96.1 95.3  PLT 369 405*  --  354 333 330   Basic Metabolic Panel:  Recent Labs Lab 06/06/16 0534 06/07/16 0315 06/09/16 0224 06/10/16 0409 06/11/16 1050  NA 136 133* 133* 132* 131*  K 5.0 4.8 5.2* 5.4* 5.0  CL 103 103 103 103 99*  CO2 18* 19* 19* 16* 21*  GLUCOSE 133* 115* 154* 150* 265*  BUN 49* 39* 36* 39* 37*  CREATININE 2.90* 2.42* 2.42* 2.61* 2.46*  CALCIUM 8.4* 8.4* 8.6* 8.6* 8.5*  MG  --  2.6*  --   --   --    GFR: Estimated Creatinine Clearance: 23.5 mL/min (by C-G formula based on Cr of 2.46). Liver Function Tests:  Recent Labs Lab 06/06/16 1445  PROT 6.1*   No results for input(s): LIPASE, AMYLASE in the last 168 hours. No results for input(s): AMMONIA in the last 168 hours. Coagulation Profile: No results for input(s): INR, PROTIME in the last 168 hours. Cardiac Enzymes:  Recent Labs Lab 06/14/2016 2007 06/28/2016 2343 06/06/16 0534 06/06/16 1127  TROPONINI 0.03 <0.03 0.03 0.03   BNP (last 3 results)  Recent Labs  05/14/16 1012  PROBNP 1454.0*   HbA1C: No results for input(s): HGBA1C in the last 72 hours. CBG:  Recent Labs Lab 06/10/16 1641 06/10/16 2201 06/11/16 0552 06/11/16 0835 06/11/16 1127  GLUCAP 138* 169* 148* 155* 305*   Lipid Profile: No results for input(s): CHOL, HDL, LDLCALC, TRIG, CHOLHDL, LDLDIRECT in the last 72 hours. Thyroid Function Tests: No results for input(s): TSH, T4TOTAL, FREET4, T3FREE, THYROIDAB in the last 72 hours. Anemia Panel: No results for input(s): VITAMINB12, FOLATE, FERRITIN, TIBC, IRON, RETICCTPCT in the last 72 hours. Sepsis Labs: No results for input(s): PROCALCITON, LATICACIDVEN in the last 168 hours.  Recent Results (from the past 240 hour(s))  Blood culture (routine x  2)     Status: None   Collection Time: 06/15/2016 10:18 PM  Result Value Ref Range Status   Specimen Description BLOOD LEFT ARM  Final   Special Requests IN PEDIATRIC BOTTLE 3CC  Final   Culture NO GROWTH 5 DAYS  Final   Report Status 06/10/2016 FINAL  Final  Blood culture (routine x 2)     Status: None   Collection Time: 06/17/2016 10:23 PM  Result Value Ref Range Status   Specimen Description BLOOD LEFT HAND  Final   Special Requests IN PEDIATRIC BOTTLE 3CC  Final   Culture NO GROWTH 5 DAYS  Final   Report Status 06/10/2016 FINAL  Final  Culture, body fluid-bottle     Status: None   Collection Time: 06/06/16  2:43 PM  Result Value Ref Range Status   Specimen Description PLEURAL LEFT  Final   Special Requests AEROBIC BOTTLE ONLY 6CC  Final   Culture NO GROWTH 5 DAYS  Final   Report Status 06/11/2016 FINAL  Final  Gram stain     Status: None   Collection Time: 06/06/16  2:43 PM  Result Value Ref Range Status   Specimen Description PLEURAL LEFT  Final   Special Requests NONE  Final   Gram Stain   Final    ABUNDANT WBC PRESENT,BOTH PMN AND MONONUCLEAR NO ORGANISMS SEEN    Report Status 06/06/2016 FINAL  Final         Radiology Studies: Dg Chest Port 1 View  06/10/2016  CLINICAL DATA:  Shortness of Breath EXAM: PORTABLE CHEST 1 VIEW COMPARISON:  06/06/2016 FINDINGS: Cardiomediastinal silhouette is stable. There is moderate left pleural effusion increased from prior exam with left basilar atelectasis or infiltrate. Central mild vascular congestion without convincing pulmonary edema. Atherosclerotic calcifications of thoracic aorta again noted. IMPRESSION: Moderate left pleural effusion increased from prior exam with left basilar atelectasis or infiltrate. Central mild vascular congestion without convincing pulmonary edema. Electronically Signed   By: Lahoma Crocker M.D.   On: 06/10/2016 09:49        Scheduled Meds: . antiseptic oral rinse  7 mL Mouth Rinse q12n4p  . aspirin EC  81  mg Oral QHS  . budesonide (PULMICORT) nebulizer solution  0.5 mg Nebulization BID  . carvedilol  3.125 mg Oral BID WC  . chlorhexidine  15 mL Mouth Rinse BID  . clopidogrel  75 mg Oral Q breakfast  . insulin aspart  0-9 Units Subcutaneous TID WC & HS  . ipratropium  0.5 mg Nebulization TID  . isosorbide dinitrate  10 mg Oral BID  . levalbuterol  0.63 mg Nebulization TID  . ranolazine  500 mg Oral BID  . sodium chloride flush  3 mL Intravenous Q12H  . sodium chloride flush  3 mL Intravenous Q12H   Continuous Infusions:    LOS: 6 days    Time spent: > 35 minutes   Velvet Bathe, MD [facility] Pager [phone]  If 7PM-7AM, please contact night-coverage [email or web address] Password [facility] 06/11/2016, 2:54 PM

## 2016-06-11 NOTE — Care Management Important Message (Signed)
Important Message  Patient Details  Name: Alan Ford MRN: [id number] Date of Birth: [birth year]   Medicare Important Message Given:  Yes    [name] 06/11/2016, 1:45 PM

## 2016-06-11 NOTE — NC FL2 (Signed)
[place] LEVEL OF CARE SCREENING TOOL     IDENTIFICATION  Patient Name: Alan Ford Birthdate: 11-03-1930 Sex: male Admission Date (Current Location): 06/06/2016  [facility] and [place] Number:  Herbalist and Address:  [facility], [place] [address] [place], [place] [address]      Provider Number: [id number]  Attending Physician Name and Address:  Velvet Bathe, MD  Relative Name and Phone Number:       Current Level of Care: Hospital Recommended Level of Care: [place] Prior Approval Number:    Date Approved/Denied:   PASRR Number: [id number]  Discharge Plan: SNF    Current Diagnoses: Patient Active Problem List   Diagnosis Date Noted  . Lymphadenopathy   . Pleural effusion on left 06/17/2016  . Acute on chronic renal failure ([place]) 06/11/2016  . Acute on chronic respiratory failure with hypoxia ([place]) 06/15/2016  . Lung mass 05/29/2016  . Dyslipidemia associated with type 2 diabetes mellitus ([place])   . Troponin level elevated   . Pleural effusion   . Coronary artery disease involving native coronary artery of native heart without angina pectoris   . Aortic calcification ([place]) 05/16/2016  . CHF (congestive heart failure) ([place]) 05/15/2016  . CRF (chronic renal failure) 05/15/2016  . NSTEMI (non-ST elevated myocardial infarction) ([place]) 05/14/2016  . Acute on chronic congestive heart failure ([place])   . Essential hypertension   . Simple chronic bronchitis ([place])   . MGUS (monoclonal gammopathy of unknown significance) 12/12/2015  . Obstructive sleep apnea 11/17/2014  . Chest pain 06/14/2014  . COPD (chronic obstructive pulmonary disease) ([place]) 12/14/2013  . ILD (interstitial lung disease) ([place]) 12/14/2013  . URI (upper respiratory infection) 12/14/2013  . Dyspnea 07/10/2013  . Abdominal aneurysm without mention of rupture 12/09/2012  . High blood pressure 07/24/2011  . High cholesterol 07/24/2011  . Diabetes  mellitus ([place]) 07/24/2011  . Chronic diastolic heart failure ([place]) 07/24/2011    Orientation RESPIRATION BLADDER Height & Weight     Self, Time, Situation, Place  O2 (Nasal Canula 5 L) Continent, External catheter Weight: 168 lb 6.4 oz (76.386 kg) (a scale) Height:  5' 10.5" (179.1 cm)  BEHAVIORAL SYMPTOMS/MOOD NEUROLOGICAL BOWEL NUTRITION STATUS   (None)  (None) Continent Diet (Heart-healty/carb-modified)  AMBULATORY STATUS COMMUNICATION OF NEEDS Skin   Limited Assist Verbally Surgical wounds, Other (Comment) (Closed incision on back. MASD on buttocks. Ecchymosis on abdomen and both arms. Skin tear on right elbow.)                       Personal Care Assistance Level of Assistance  Bathing, Feeding, Dressing Bathing Assistance: Limited assistance Feeding assistance: Independent Dressing Assistance: Limited assistance     Functional Limitations Info  Sight, Hearing, Speech Sight Info: Adequate Hearing Info: Adequate Speech Info: Adequate    SPECIAL CARE FACTORS FREQUENCY  PT (By licensed PT), Blood pressure, Diabetic urine testing     PT Frequency: 5 x week              Contractures Contractures Info: Not present    Additional Factors Info  Code Status, Allergies Code Status Info: Full Allergies Info: Hydrocodone           Current Medications (06/11/2016):  This is the current hospital active medication list Current Facility-Administered Medications  Medication Dose Route Frequency Provider Last Rate Last Dose  . 0.9 %  sodium chloride infusion  250 mL Intravenous PRN Rachal  Rayvon Char, MD      . acetaminophen (TYLENOL) tablet 650 mg  650 mg Oral Q6H PRN Gardiner Barefoot, NP   650 mg at 06/10/16 2221  . antiseptic oral rinse (CPC / CETYLPYRIDINIUM CHLORIDE 0.05%) solution 7 mL  7 mL Mouth Rinse q12n4p Phillips Grout, MD   7 mL at 06/11/16 1200  . aspirin EC tablet 81 mg  81 mg Oral QHS Phillips Grout, MD   81 mg at 06/10/16 2159  . budesonide (PULMICORT)  nebulizer solution 0.5 mg  0.5 mg Nebulization BID Velvet Bathe, MD   0.5 mg at 06/11/16 0728  . carvedilol (COREG) tablet 3.125 mg  3.125 mg Oral BID WC Phillips Grout, MD   3.125 mg at 06/11/16 0831  . chlorhexidine (PERIDEX) 0.12 % solution 15 mL  15 mL Mouth Rinse BID Phillips Grout, MD   15 mL at 06/11/16 0934  . clopidogrel (PLAVIX) tablet 75 mg  75 mg Oral Q breakfast Phillips Grout, MD   75 mg at 06/11/16 0831  . insulin aspart (novoLOG) injection 0-9 Units  0-9 Units Subcutaneous TID WC & HS Velvet Bathe, MD   1 Units at 06/11/16 [phone]  . ipratropium (ATROVENT) nebulizer solution 0.5 mg  0.5 mg Nebulization TID Velvet Bathe, MD   0.5 mg at 06/11/16 0720  . isosorbide dinitrate (ISORDIL) tablet 10 mg  10 mg Oral BID Phillips Grout, MD   10 mg at 06/11/16 0934  . levalbuterol (XOPENEX) nebulizer solution 0.63 mg  0.63 mg Nebulization Q3H PRN Corey Harold, NP   0.63 mg at 06/08/16 1938  . levalbuterol (XOPENEX) nebulizer solution 0.63 mg  0.63 mg Nebulization TID Velvet Bathe, MD   0.63 mg at 06/11/16 0720  . ranolazine (RANEXA) 12 hr tablet 500 mg  500 mg Oral BID Phillips Grout, MD   500 mg at 06/11/16 0934  . sodium chloride flush (NS) 0.9 % injection 3 mL  3 mL Intravenous Q12H Phillips Grout, MD   3 mL at 06/11/16 1000  . sodium chloride flush (NS) 0.9 % injection 3 mL  3 mL Intravenous Q12H Phillips Grout, MD   3 mL at 06/11/16 1000  . sodium chloride flush (NS) 0.9 % injection 3 mL  3 mL Intravenous PRN Phillips Grout, MD      . sodium polystyrene (KAYEXALATE) 15 GM/60ML suspension 15 g  15 g Oral Once Velvet Bathe, MD      . zolpidem (AMBIEN) tablet 5 mg  5 mg Oral QHS PRN Ritta Slot, NP   5 mg at 06/09/16 2149     Discharge Medications: Please see discharge summary for a list of discharge medications.  Relevant Imaging Results:  Relevant Lab Results:   Additional Information SS#: [id number]  Candie Chroman, LCSW

## 2016-06-11 NOTE — Progress Notes (Signed)
Physical Therapy Treatment Patient Details Name: Alan Ford MRN: [id number] DOB: [birth year] Today's Date: 06/11/2016    History of Present Illness 82 y.o. male with known diabetes, HTN, HLD, CHF, emphysema/COPD, CKD, A. fib, OSA on CPAP, diastolic CHF, NSTEMI, underwent cath 5/16.  Returned 6/6 with c/o progressive dyspnea, orthopnea, cough, CXR revealed increase in known pleural effusion and worsening pulmonary edema. Started on bipap and admitted to SDU where PCCM was consulted to assist with pulmonary needs. L thoracentesis with 1.5 L removed 6/7.  Has a lung mass; MDs considering Palliative Care consult    PT Comments    Mr. Memoli is motivated to get home and a hard worker, even while experiencing a greater need for supplemental O2 with activity;   Lengthy discussion with wife re: dc planning; she is hesitant to send him to SNF for rehab; would rather he stay in acute hospital -- I explained that as long as he has a medical need for acute hospital he will be here, and that when his rehab needs (with the goal to get home) outweigh his other needs then SNF for rehab is appropriate;   Noted Critical Care had mentioned possible Palliative Care team consult, and I heartily agree.  Follow Up Recommendations  SNF     Equipment Recommendations  Rolling walker with 5" wheels    Recommendations for Other Services       Precautions / Restrictions Precautions Precautions: Fall Precaution Comments: monitor O2    Mobility  Bed Mobility                  Transfers Overall transfer level: Needs assistance Equipment used: Rolling walker (2 wheeled) Transfers: Sit to/from Stand Sit to Stand: Min guard         General transfer comment: Cues for hand placement and safety  Ambulation/Gait Ambulation/Gait assistance: Min guard Ambulation Distance (Feet): 70 Feet Assistive device: Rolling walker (2 wheeled) Gait Pattern/deviations: Step-through pattern;Trunk flexed Gait  velocity: decreased   General Gait Details: Cues to self-monitor for activity tolerance; Walked on 6 L O2, and O2 sats dropped to as low as 85% by the end of walk; recovered to 94% with 5-7 minutes of seated rest; RN aware   Stairs            Wheelchair Mobility    Modified Rankin (Stroke Patients Only)       Balance                                    Cognition Arousal/Alertness: Awake/alert Behavior During Therapy: WFL for tasks assessed/performed Overall Cognitive Status: Within Functional Limits for tasks assessed                      Exercises Other Exercises Other Exercises: Pursed lip breathing.  Demonstrated to pt and cues throughout session.    General Comments        Pertinent Vitals/Pain Pain Assessment: No/denies pain Pain Location: While pt denies pain, he is visibly uncomfortable, and having difficulty breathing; RN aware    Home Living                      Prior Function            PT Goals (current goals can now be found in the care plan section) Acute Rehab PT Goals Patient Stated Goal: return home PT Goal  Formulation: With patient Time For Goal Achievement: 06/23/16 Potential to Achieve Goals: Good Progress towards PT goals: Progressing toward goals (though needing increased supplemental O2)    Frequency  Min 3X/week    PT Plan Current plan remains appropriate    Co-evaluation             End of Session Equipment Utilized During Treatment: Gait belt;Oxygen Activity Tolerance: Other (comment) (Hypoxic with actvity on supplemental O2) Patient left: in chair;with call bell/phone within reach;with family/visitor present     Time: 1430-1500 (times are approximate) PT Time Calculation (min) (ACUTE ONLY): 30 min  Charges:  $Gait Training: 8-22 mins $Therapeutic Activity: 8-22 mins                    G Codes:      Quin Hoop 06/11/2016, 3:18 PM  Roney Marion, [place] Pager [phone] Office [phone]

## 2016-06-11 NOTE — Progress Notes (Signed)
Name: Alan Ford MRN: [id number] DOB: 15-Jul-1930    ADMISSION DATE:  06/16/2016 CONSULTATION DATE:  6/7  REFERRING MD :  Wendee Beavers ([facility])   CHIEF COMPLAINT:  Respiratory failure   BRIEF PATIENT DESCRIPTION: 82yo male former smoker with hx DM, chronic dCHF, CKD 3-4, OSA on CPAP, ILD (NSIP) and COPD on 3L home O2 followed by Dr. Lamonte Sakai as well as known L hilar lung mass.  Had recent admit (5/15-5/23) for NSTEMI with cath and RCA stent placed.  He returned 6/6 with c/o progressive dyspnea, orthopnea, cough.  He was admitted by [facility], CXR revealed slight increase in known pleural effusion and worsening pulmonary edema.  He was started on bipap and admitted to SDU where PCCM was consulted to assist with pulmonary needs. He underwent L thoracentesis with 1.5 L removed 6/7. Fluid was exudate by LDH only (marked pleural elevation to 1217, serum 188).    STUDIES:  L thora 6/7: LDH ratio 6.5, Protein ratio 0.5. Exudate by LDH, Gram stain neg. Culture *, Glucose 90, Cholesterol *, pH 7.7,  ADA *  SUBJECTIVE:  Breathing comfortably on 5L [place]. Still has intermittent SOB but overall feeling much better with diuresis.    VITAL SIGNS: Temp:  [98.2 F (36.8 C)-99 F (37.2 C)] 99 F (37.2 C) (06/12 0601) Pulse Rate:  [98-106] 98 (06/12 0601) Resp:  [20-22] 22 (06/12 0601) BP: (102-143)/(68-75) 143/75 mmHg (06/12 0601) SpO2:  [92 %-94 %] 92 % (06/12 0601) Weight:  [76.386 kg (168 lb 6.4 oz)] 76.386 kg (168 lb 6.4 oz) (06/12 0601)  PHYSICAL EXAMINATION: General:  Pleasant male, chronically ill appearing, NAD sitting OOB in chair  Neuro:  Awake, alert, appropriate, MAE, gen weakness  HEENT:  Mm moist, no JVD  Cardiovascular:  Mildly tachy, no murmur or gallop Lungs:  Diminished throughout resps even, non labored on 5L [place]  Abdomen:  +BS x4 Round, soft, non tender  Musculoskeletal:  Warm and dry, scant BLE edema    Recent Labs Lab 06/07/16 0315 06/09/16 0224 06/10/16 0409  NA 133* 133* 132*  K 4.8  5.2* 5.4*  CL 103 103 103  CO2 19* 19* 16*  BUN 39* 36* 39*  CREATININE 2.42* 2.42* 2.61*  GLUCOSE 115* 154* 150*    Recent Labs Lab 06/09/16 0224 06/10/16 0409 06/11/16 1050  HGB 9.8* 9.8* 9.4*  HCT 32.0* 31.7* 30.2*  WBC 13.2* 15.0* 13.5*  PLT 354 333 326   Dg Chest Port 1 View  06/10/2016  CLINICAL DATA:  Shortness of Breath EXAM: PORTABLE CHEST 1 VIEW COMPARISON:  06/06/2016 FINDINGS: Cardiomediastinal silhouette is stable. There is moderate left pleural effusion increased from prior exam with left basilar atelectasis or infiltrate. Central mild vascular congestion without convincing pulmonary edema. Atherosclerotic calcifications of thoracic aorta again noted. IMPRESSION: Moderate left pleural effusion increased from prior exam with left basilar atelectasis or infiltrate. Central mild vascular congestion without convincing pulmonary edema. Electronically Signed   By: Lahoma Crocker M.D.   On: 06/10/2016 09:49    ASSESSMENT / PLAN:   Acute on chronic respiratory failure - multifactorial r/t enlarging L pleural effusion and pulmonary edema in setting decompensated diastolic dysfunction c/b acute on CKD 3-4 with underlying mod/severe COPD and ILD.  Interstitial Lung disease COPD without acute exacerbation OSA on CPAP L malignant pleural effusion s/p thora 6/7 (Exudate by LDH [1217] only). 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   PLAN -  Would recommend pleur-x catheter for symptom management of malignant  effusion - d/w patient at length and he is agreeable  Consider palliative care involvement for goals of care  Pt prefers Dr. [name] (treated pt's wife for pancreatic cancer) and would like his opinion before deciding about treatment plans  PRN bipap  Supplemental O2 as needed to keep sats >90% - will need home O2  Scheduled BD  Gentle diuresis as BP and Scr tol  Pulmonary hygiene    Acute on chronic dCHF  AKI on CKD CAD - s/p recent  RCA stent  PLAN -  - Per primary team    Nickolas Madrid, NP 06/11/2016  11:30 AM Pager: [phone] or [phone]  Attending Note:  I have examined patient, reviewed labs, studies and notes. I have discussed the case with Shon Millet, and I agree with the data and plans as amended above.   Reviewed and examined the patient. He is well known to me from office. New dx poorly diff NSCLCA with malignant effusion. I discussed options w him. He wants to talk to Dr Katheran Awe about therapeutic options although he may decide not to pursue these. In meantime I believe that Pleurx would be helpful. I also believe that he should see Palliative Care to discuss possible hospice care. One of his wishes would be to be able to go home instead of SNF. I will consult Palliative Care now.    [name], MD, PhD 06/11/2016, 5:06 PM [facility] [phone] or if no answer [phone]

## 2016-06-11 NOTE — Clinical Social Work Placement (Signed)
   CLINICAL SOCIAL WORK PLACEMENT  NOTE  Date:  06/11/2016  Patient Details  Name: Alan Ford MRN: [id number] Date of Birth: [birth year]  Clinical Social Work is seeking post-discharge placement for this patient at the [place] level of care (*CSW will initial, date and re-position this form in  chart as items are completed):  Yes   Patient/family provided with [facility] Work Department's list of facilities offering this level of care within the geographic area requested by the patient (or if unable, by the patient's family).  Yes   Patient/family informed of their freedom to choose among providers that offer the needed level of care, that participate in Medicare, Medicaid or managed care program needed by the patient, have an available bed and are willing to accept the patient.  Yes   Patient/family informed of [facility]'s ownership interest in [facility] and [facility], as well as of the fact that they are under no obligation to receive care at these facilities.  PASRR submitted to EDS on 06/11/16     PASRR number received on 06/11/16     Existing PASRR number confirmed on       FL2 transmitted to all facilities in geographic area requested by pt/family on 06/11/16     FL2 transmitted to all facilities within larger geographic area on       Patient informed that his/her managed care company has contracts with or will negotiate with certain facilities, including the following:            Patient/family informed of bed offers received.  Patient chooses bed at       Physician recommends and patient chooses bed at      Patient to be transferred to   on  .  Patient to be transferred to facility by       Patient family notified on   of transfer.  Name of family member notified:        PHYSICIAN       Additional Comment:    _______________________________________________ Candie Chroman, LCSW 06/11/2016, 11:17 AM

## 2016-06-12 DIAGNOSIS — R0600 Dyspnea, unspecified: Secondary | ICD-10-CM

## 2016-06-12 DIAGNOSIS — Z66 Do not resuscitate: Secondary | ICD-10-CM

## 2016-06-12 DIAGNOSIS — Z515 Encounter for palliative care: Secondary | ICD-10-CM

## 2016-06-12 DIAGNOSIS — J948 Other specified pleural conditions: Secondary | ICD-10-CM

## 2016-06-12 LAB — GLUCOSE, CAPILLARY
GLUCOSE-CAPILLARY: 145 mg/dL — AB (ref 65–99)
GLUCOSE-CAPILLARY: 161 mg/dL — AB (ref 65–99)
GLUCOSE-CAPILLARY: 201 mg/dL — AB (ref 65–99)
GLUCOSE-CAPILLARY: 224 mg/dL — AB (ref 65–99)
GLUCOSE-CAPILLARY: 189 mg/dL — AB (ref 65–99)
GLUCOSE-CAPILLARY: 149 mg/dL — AB (ref 65–99)
Glucose-Capillary: 171 mg/dL — ABNORMAL HIGH (ref 65–99)

## 2016-06-12 LAB — CBC
HCT: 27.6 % — ABNORMAL LOW (ref 39.0–52.0)
Hemoglobin: 8.8 g/dL — ABNORMAL LOW (ref 13.0–17.0)
MCH: 29.8 pg (ref 26.0–34.0)
MCHC: 31.9 g/dL (ref 30.0–36.0)
MCV: 93.6 fL (ref 78.0–100.0)
PLATELETS: 307 10*3/uL (ref 150–400)
RBC: 2.95 MIL/uL — ABNORMAL LOW (ref 4.22–5.81)
RDW: 14.1 % (ref 11.5–15.5)
WBC: 13.9 10*3/uL — AB (ref 4.0–10.5)

## 2016-06-12 MED ORDER — FUROSEMIDE 10 MG/ML IJ SOLN
40.0000 mg | Freq: Once | INTRAMUSCULAR | Status: AC
  Administered 2016-06-12: 40 mg via INTRAVENOUS
  Filled 2016-06-12: qty 4

## 2016-06-12 NOTE — Consult Note (Signed)
Consultation Note Date: 06/12/2016   Patient Name: Alan Ford  DOB: 12-12-1930  MRN: [id number]  Age / Sex: 82 y.o., male  PCP: Lajean Manes, MD Referring Physician: Velvet Bathe, MD  Reason for Consultation: Establishing goals of care and Psychosocial/spiritual support  HPI/Patient Profile: 82 y.o. male   admitted on 06/12/2016    NSTEMI 2 weeks ago with cath/stent placed in RCA, copd on 3 liters [place] o2 at home, new lung massdiagnosis, CHF, afib comes in with progressive worsening sob and cough since he was discharged. Has been coughing which is worse while lying down and is sometimes productive. He reports worsening orthopnea and PND. Denies fevers or chills. Denies any worsening of his peripheral edema. He has intermittent chest pain for the past 3 weeks. . He had a left pleural effusion and lung mass. Today cxr shows worsening left pleural effusion and worsening hypoxic hypoxia. Thoracentesis, 06-06-16 for 1.5 liters, with continued  re accumulation.   Patient faces advanced directive decisions and anticipatory care needs  Clinical Assessment and Goals of Care:   This NP Wadie Lessen reviewed medical records, received report from team, assessed the patient and then meet at the patient's bedside along with his wife   to discuss diagnosis, prognosis, GOC, EOL wishes disposition and options.  A detailed discussion was had today regarding advanced directives.  Concepts specific to code status, artifical feeding and hydration, continued IV antibiotics and rehospitalization was had.  The difference between a aggressive medical intervention path  and a palliative comfort care path for this patient at this time was had.  Values and goals of care important to patient and family were attempted to be elicited.  Concept of Hospice and Palliative Care were discussed  Natural trajectory and expectations at EOL were  discussed.  Questions and concerns addressed/MOST form introduced.    Hard Choices booklet left for review. Family encouraged to call with questions or concerns.  PMT will continue to support holistically.     SUMMARY OF RECOMMENDATIONS   -pulmonary f/u for possible pluer-ex cath placement for management of pleural effusions -when medically stable disposition to SNF for rehab (patient remains hopeful for improvement) - if patient is physically able he would like to see Dr [name] in OP setting for input on lung lesion   Code Status/Advance Care Planning:  DNR/DNI-documentned today    Psycho-social/Spiritual:   Desire for further Chaplaincy support: strong community church support  Additional Recommendations: Education on Hospice  Prognosis:   < 3 months  Discharge Planning: [facility] for rehab with Palliative care service follow-up      Primary Diagnoses: Present on Admission:  . Pleural effusion on left . Lung mass . Coronary artery disease involving native coronary artery of native heart without angina pectoris . Essential hypertension . ILD (interstitial lung disease) ([place]) . COPD (chronic obstructive pulmonary disease) ([place]) . Dyspnea . Chronic diastolic heart failure ([place]) . Acute on chronic renal failure ([place]) . Acute on chronic respiratory failure with hypoxia (HCC)  I have  reviewed the medical record, interviewed the patient and family, and examined the patient. The following aspects are pertinent.  Past Medical History  Diagnosis Date  . Diabetes mellitus   . High blood pressure   . High cholesterol   . CHF (congestive heart failure) ([place])   . Emphysema   . Joint pain   . AAA (abdominal aortic aneurysm) ([place])   . COPD (chronic obstructive pulmonary disease) ([place])   . Chronic kidney disease   . Irregular heart beat   . PVC (premature ventricular contraction)   . Shingles   . Chronic renal disease, stage III   . Sleep apnea     cpap    . Irregular heart beat   . CAD (coronary artery disease)   . Chronic diastolic heart failure ([place]) 07/24/2011  . Atrial fibrillation ([place])   . Aortic calcification (HCC) 05/16/2016    Involving ascending and descending thoracic aorta   Social History   Social History  . Marital Status: Married    Spouse Name: N/A  . Number of Children: 2  . Years of Education: N/A   Occupational History  . mental health    Social History Main Topics  . Smoking status: Former Smoker -- 3.00 packs/day for 30 years    Types: Cigarettes    Quit date: 12/31/1985  . Smokeless tobacco: Former Systems developer    Types: Chew     Comment: "little chewing tobacco"  . Alcohol Use: No     Comment: quit in 1985  . Drug Use: No  . Sexual Activity: Not Asked   Other Topics Concern  . None   Social History Narrative   Family History  Problem Relation Age of Onset  . Other Father     AAA  . Heart disease Father     After age 75,   AAA  . Hypertension Father   . Hyperlipidemia Father   . Heart attack Father   . Diabetes Mother     amputation  . Heart disease Mother   . Hypertension Mother   . Hyperlipidemia Mother   . Deep vein thrombosis Mother   . Heart attack Mother   . Diabetes Sister   . Heart disease Sister     Heart Disease before age 15  . Hypertension Sister   . Hyperlipidemia Sister   . Diabetes Brother   . Heart disease Brother   . Hypertension Brother   . Hyperlipidemia Brother   . Cancer Daughter    Scheduled Meds: . antiseptic oral rinse  7 mL Mouth Rinse q12n4p  . aspirin EC  81 mg Oral QHS  . budesonide (PULMICORT) nebulizer solution  0.5 mg Nebulization BID  . carvedilol  3.125 mg Oral BID WC  . chlorhexidine  15 mL Mouth Rinse BID  . clopidogrel  75 mg Oral Q breakfast  . furosemide  40 mg Intravenous Once  . insulin aspart  0-9 Units Subcutaneous TID WC & HS  . ipratropium  0.5 mg Nebulization TID  . isosorbide dinitrate  10 mg Oral BID  . levalbuterol  0.63 mg  Nebulization TID  . ranolazine  500 mg Oral BID  . sodium chloride flush  3 mL Intravenous Q12H  . sodium chloride flush  3 mL Intravenous Q12H   Continuous Infusions:  PRN Meds:.sodium chloride, acetaminophen, HYDROcodone-acetaminophen, levalbuterol, sodium chloride flush, zolpidem Medications Prior to Admission:  Prior to Admission medications   Medication Sig Start Date End Date Taking? Authorizing Provider  acetaminophen (TYLENOL)  325 MG tablet Take 2 tablets (650 mg total) by mouth every 4 (four) hours as needed for headache or mild pain. 05/22/16  Yes Robbie Lis, MD  albuterol (PROVENTIL HFA;VENTOLIN HFA) 108 (90 Base) MCG/ACT inhaler Inhale 2 puffs into the lungs every 6 (six) hours as needed for wheezing or shortness of breath. 01/09/16  Yes Collene Gobble, MD  AMLODIPINE BESYLATE PO Take 1 tablet by mouth at bedtime.    Yes Historical Provider, MD  Ascorbic Acid (VITAMIN C) 1000 MG tablet Take 1,000 mg by mouth daily.   Yes Historical Provider, MD  aspirin EC 81 MG tablet Take 81 mg by mouth at bedtime.    Yes Historical Provider, MD  carvedilol (COREG) 3.125 MG tablet Take 3.125 mg by mouth 2 (two) times daily with a meal.   Yes Historical Provider, MD  Chromium Picolinate 800 MCG TABS Take 1,600 mcg by mouth daily.    Yes Historical Provider, MD  clopidogrel (PLAVIX) 75 MG tablet Take 1 tablet (75 mg total) by mouth daily with breakfast. 05/22/16  Yes Robbie Lis, MD  Coenzyme Q10 (CO Q-10) 100 MG CAPS Take 100 mg by mouth at bedtime.    Yes Historical Provider, MD  furosemide (LASIX) 20 MG tablet Take 20 mg by mouth 2 (two) times daily.   Yes Historical Provider, MD  glipiZIDE (GLUCOTROL XL) 10 MG 24 hr tablet Take 10 mg by mouth at bedtime.   Yes Historical Provider, MD  glipiZIDE (GLUCOTROL XL) 5 MG 24 hr tablet Take 1 tablet (5 mg total) by mouth daily. Patient taking differently: Take 2.5 mg by mouth daily with breakfast.  05/22/16  Yes Robbie Lis, MD  guaiFENesin (MUCINEX)  600 MG 12 hr tablet Take 1 tablet (600 mg total) by mouth 2 (two) times daily as needed for to loosen phlegm. 05/22/16  Yes Robbie Lis, MD  HYDROcodone-acetaminophen (NORCO/VICODIN) 5-325 MG per tablet Take 1 tablet by mouth daily as needed for moderate pain or severe pain.  10/26/13  Yes Historical Provider, MD  isosorbide dinitrate (ISORDIL) 10 MG tablet Take 1 tablet (10 mg total) by mouth 2 (two) times daily. 05/22/16  Yes Robbie Lis, MD  Multiple Vitamins-Minerals (OCUVITE PO) Take 1 tablet by mouth every morning.   Yes Historical Provider, MD  nitroGLYCERIN (NITROSTAT) 0.4 MG SL tablet Place 1 tablet (0.4 mg total) under the tongue every 5 (five) minutes as needed for chest pain. 05/30/16  Yes Josue Hector, MD  PRESCRIPTION MEDICATION Inhale 2 puffs into the lungs every morning. Inhaler that starts with "e" or "r"   Yes Historical Provider, MD  ranolazine (RANEXA) 500 MG 12 hr tablet Take 1 tablet (500 mg total) by mouth 2 (two) times daily. 05/30/16  Yes Josue Hector, MD  simvastatin (ZOCOR) 20 MG tablet Take 20 mg by mouth at bedtime.  10/21/12  Yes Historical Provider, MD  Tamsulosin HCl (FLOMAX) 0.4 MG CAPS Take 0.4 mg by mouth at bedtime.  10/21/12  Yes Historical Provider, MD  zinc gluconate 50 MG tablet Take 50 mg by mouth at bedtime.    Yes Historical Provider, MD   No Active Allergies Review of Systems  Constitutional: Positive for activity change, appetite change and fatigue.  Respiratory: Positive for shortness of breath and wheezing.   Neurological: Positive for weakness.    Physical Exam  Constitutional: He appears cachectic. He appears ill.  Cardiovascular: Tachycardia present.   Pulmonary/Chest: He has decreased breath sounds in  the right middle field, the right lower field and the left lower field.  Skin: Skin is warm and dry.    Vital Signs: BP 128/74 mmHg  Pulse 101  Temp(Src) 98.1 F (36.7 C) (Oral)  Resp 20  Ht 5' 10.5" (1.791 m)  Wt 76.658 kg (169 lb)   BMI 23.90 kg/m2  SpO2 91% Pain Assessment: No/denies pain POSS *See Group Information*: 1-Acceptable,Awake and alert Pain Score: 0-No pain   SpO2: SpO2: 91 % O2 Device:SpO2: 91 % O2 Flow Rate: .O2 Flow Rate (L/min): 5 L/min  IO: Intake/output summary:  Intake/Output Summary (Last 24 hours) at 06/12/16 1040 Last data filed at 06/12/16 0934  Gross per 24 hour  Intake    460 ml  Output    525 ml  Net    -65 ml    LBM: Last BM Date: 06/10/16 Baseline Weight: Weight: 75.751 kg (167 lb) Most recent weight: Weight: 76.658 kg (169 lb) (scale a)      Palliative Assessment/Data: 30 % at best   Flowsheet Rows        Most Recent Value   Intake Tab    Referral Department  Hospitalist   Unit at Time of Referral  Cardiac/Telemetry Unit   Palliative Care Primary Diagnosis  Cancer   Date Notified  06/11/16   Palliative Care Type  New Palliative care   Reason for referral  Clarify Goals of Care   Date of Admission  06/02/2016   Date first seen by Palliative Care  06/12/16   # of days Palliative referral response time  1 Day(s)   # of days IP prior to Palliative referral  6   Clinical Assessment    Psychosocial & Spiritual Assessment    Palliative Care Outcomes      Discussed with Dr Wendee Beavers and Dr Lamonte Sakai  Time In: 1400 Time Out: 1515 Time Total: 75 min Greater than 50%  of this time was spent counseling and coordinating care related to the above assessment and plan.  Signed by: Wadie Lessen, NP   Please contact Palliative Medicine Team phone at [phone] for questions and concerns.  For individual provider: See Shea Evans

## 2016-06-12 NOTE — Clinical Social Work Note (Addendum)
CSW provided bed offers to patient and asked him to let CSW know when he has made a decision. He stated that there was supposed to be a meeting today (possibly palliative?).  Will follow up later today.  Dayton Scrape, [place] [phone]  3:16 pm Patient accepted bed offer from [facility]. CSW left voicemail for [place], admissions coordinator letting her know.  Dayton Scrape, [place]

## 2016-06-12 NOTE — Progress Notes (Signed)
Name: Alan Ford MRN: [id number] DOB: 1930/10/29    ADMISSION DATE:  06/22/2016 CONSULTATION DATE:  6/7  REFERRING MD :  Wendee Beavers ([facility])   CHIEF COMPLAINT:  Respiratory failure   BRIEF PATIENT DESCRIPTION: 82yo male former smoker with hx DM, chronic dCHF, CKD 3-4, OSA on CPAP, ILD (NSIP) and COPD on 3L home O2 followed by Dr. Lamonte Sakai as well as known L hilar lung mass.  Had recent admit (5/15-5/23) for NSTEMI with cath and RCA stent placed.  He returned 6/6 with c/o progressive dyspnea, orthopnea, cough.  He was admitted by [facility], CXR revealed slight increase in known pleural effusion and worsening pulmonary edema.  He was started on bipap and admitted to SDU where PCCM was consulted to assist with pulmonary needs. He underwent L thoracentesis with 1.5 L removed 6/7. Fluid was exudate by LDH only (marked pleural elevation to 1217, serum 188).    STUDIES:  L thora 6/7: LDH ratio 6.5, Protein ratio 0.5. Exudate by LDH, Gram stain neg. Culture *, Glucose 90, Cholesterol *, pH 7.7,  ADA *  SUBJECTIVE:  Per pt and RN pt had worsening shortness of breath overnight with brief period of desaturation this am requiring venti mask currently he is on 6L O2 with improvement of dyspnea.  Per RN pt received 1X dose of norco overnight pt rested comfortably post administration.  Pt denies fever, chills, chest pain, or wheeze.  VITAL SIGNS: Temp:  [97.3 F (36.3 C)-98.7 F (37.1 C)] 98.7 F (37.1 C) (06/13 1123) Pulse Rate:  [99-111] 99 (06/13 1123) Resp:  [20] 20 (06/13 1123) BP: (120-135)/(61-74) 135/69 mmHg (06/13 1123) SpO2:  [84 %-93 %] 90 % (06/13 1123) Weight:  [169 lb (76.658 kg)] 169 lb (76.658 kg) (06/13 0612)  PHYSICAL EXAMINATION: General:  Pleasant male, chronically ill appearing, NAD  Neuro:  Awake, alert, appropriate, MAE, gen weakness  HEENT:  Mm moist, no JVD  Cardiovascular:  S1s2, rrr, no murmur or gallop Lungs:  Diminished throughout resps even, non labored on 6L [place]  Abdomen:  +BS  x4 Round, soft, non tender, non distended  Musculoskeletal:  Warm and dry, no edema   Recent Labs Lab 06/09/16 0224 06/10/16 0409 06/11/16 1050  NA 133* 132* 131*  K 5.2* 5.4* 5.0  CL 103 103 99*  CO2 19* 16* 21*  BUN 36* 39* 37*  CREATININE 2.42* 2.61* 2.46*  GLUCOSE 154* 150* 265*    Recent Labs Lab 06/10/16 0409 06/11/16 1050 06/12/16 0045  HGB 9.8* 9.4* 8.8*  HCT 31.7* 30.2* 27.6*  WBC 15.0* 13.5* 13.9*  PLT 333 326 307   No results found.  ASSESSMENT / PLAN:   Acute on chronic respiratory failure - multifactorial r/t enlarging L pleural effusion and pulmonary edema in setting decompensated diastolic dysfunction c/b acute on CKD 3-4 with underlying mod/severe COPD and ILD.  Interstitial Lung disease COPD without acute exacerbation OSA on CPAP L malignant pleural effusion s/p thora 6/7 (Exudate by LDH [1217] only). 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   PLAN - Palliative care meeting today 6/13 at 14:00 to discuss goals of treatment Agree with pleur-x catheter for symptom management of malignant effusion - patient is agreeable  Pt prefers Dr. [name] (treated pt's wife for pancreatic cancer) and would like his opinion before deciding about treatment plans  PRN bipap  Supplemental O2 as needed to keep sats >90% - will need home O2  Scheduled BD  Agree with 1 x dose of lasix  Pulmonary hygiene    Acute on chronic dCHF  AKI on CKD CAD - s/p recent RCA stent  PLAN -  - Per primary team  Marda Stalker, [place]   Attending Note:  I have examined patient, reviewed labs, studies and notes. I have discussed the case with D Blakenet, and I agree with the data and plans as amended above. Also discussed case with Dr Wendee Beavers and Burtis Junes NP. I agree with Pleurx catheter, getting H/O opinion regarding options for care. I believe he is a good candidate for palliative care, probably transitioning to hospice  care either at home or at [facility] at some point in the future.   [name], MD, PhD 06/12/2016, 3:33 PM [facility] [phone] or if no answer [phone]

## 2016-06-12 NOTE — Progress Notes (Signed)
PROGRESS NOTE    Alan Ford  RXV:[id number] DOB: 11-09-1930 DOA: 06/15/2016 PCP: Mathews Argyle, MD   Brief Narrative:  82 yo male NSTEMI 2 weeks ago with cath/stent placed in RCA, copd on 3 liters [place] o2 at home, new lung mass uncertain diagnosis, CHF, afib comes in with progressive worsening sob and cough since he was discharged. Has been coughing which is worse while lying down and is sometimes productive. He reports worsening orthopnea and PND. Denies fevers or chills. Denies any worsening of his peripheral edema. He has intermittent chest pain for the past 3 weeks. No hemoptysis. He had a left pleural effusion and lung mass. He has seen his cardiologist and pulmonologist in the last couple weeks and it sounds like a thoracentesis was entertained but did not want to be done due To him being on plavix. Cxr shows worsening left pleural effusion and worsening hypoxic hypoxia.  Assessment & Plan:   Principal Problem:   Acute on chronic respiratory failure with hypoxia ([place]) - Consulted Pulmonologist for assistance with management - S/p thoracentesis with improvement in respiratory status afterwards. - Will add lasix - Pulmonology on board and recommended pleurx catheter. Will consult IR. Also agree with palliative care for symptom management and goals of care. Pt would like aggressive treatment at this time. - Contacted the cancer center and left message for Dr. [name]  Active Problems:   Chronic diastolic heart failure ([place]) - Pt on isordil and B blocker - on prior admission patient diagnosed with new onset systolic CHF with EF of 50-93 percent. Diagnostic cath showed severe 3VD. Reportedly the culprit lesion was RCA Patient also had more diffuse LAD disease and long bifurcation disease in OM. 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 Per cardiology notes on 06/01/2016 that finding and COPD made him non operative. Pt had artherectomy of RCA by Dr. Tamala Julian 05/17/16     Dyspnea   COPD (chronic obstructive pulmonary disease) (HCC)/ ILD (interstitial lung disease) ([place]) - pulm on board    Essential hypertension - continue current antihypertensive regimen    Coronary artery disease involving native coronary artery of native heart without angina pectoris - aspirin    Lung mass   Pleural effusion on left - s/p thoracentesis    Acute on chronic renal failure ([place]) - monitor S creatinine   DVT prophylaxis: SCD's Code Status: full Family Communication: d/c patient directly Disposition Plan: Pending improvement in respiratory status and final recommendations by specialist involved.   Consultants:   Pulmonology   Procedures:   None   Antimicrobials: None   Subjective: Pt has no new complaints. No acute issues overnight.  Objective: Filed Vitals:   06/12/16 0800 06/12/16 0934 06/12/16 1123 06/12/16 1451  BP:   135/69   Pulse:   99   Temp:   98.7 F (37.1 C)   TempSrc:   Oral   Resp:   20   Height:      Weight:      SpO2: 93% 91% 90% 85%    Intake/Output Summary (Last 24 hours) at 06/12/16 1550 Last data filed at 06/12/16 1513  Gross per 24 hour  Intake    940 ml  Output    975 ml  Net    -35 ml   Filed Weights   06/10/16 0417 06/11/16 0601 06/12/16 0612  Weight: 77.52 kg (170 lb 14.4 oz) 76.386 kg (168 lb 6.4 oz) 76.658 kg (169 lb)    Examination:  General exam: Alert and  awake, resting comfortably in nad. Respiratory system: decreased breath sound over left lung field at bases, equal chest rise, no wheezes Cardiovascular system: S1 & S2 heard, RRR. Gastrointestinal system: Abdomen is nondistended, soft and nontender. No organomegaly or masses felt. Normal bowel sounds heard. Central nervous system: Alert and oriented. No focal neurological deficits. Extremities: Symmetric, equal tone Skin: No rashes, lesions or ulcers Psychiatry: Judgement and insight appear normal. Mood & affect appropriate.   Data Reviewed: I have  personally reviewed following labs and imaging studies  CBC:  Recent Labs Lab 06/14/2016 2007 06/06/16 0534 06/06/16 1445 06/09/16 0224 06/10/16 0409 06/11/16 1050 06/12/16 0045  WBC 10.4 11.0*  --  13.2* 15.0* 13.5* 13.9*  NEUTROABS 7.9*  --   --   --   --   --   --   HGB 9.1* 8.9*  --  9.8* 9.8* 9.4* 8.8*  HCT 28.5* 28.1* 30.6* 32.0* 31.7* 30.2* 27.6*  MCV 96.0 97.9  --  97.9 96.1 95.3 93.6  PLT 369 405*  --  354 333 326 025   Basic Metabolic Panel:  Recent Labs Lab 06/06/16 0534 06/07/16 0315 06/09/16 0224 06/10/16 0409 06/11/16 1050  NA 136 133* 133* 132* 131*  K 5.0 4.8 5.2* 5.4* 5.0  CL 103 103 103 103 99*  CO2 18* 19* 19* 16* 21*  GLUCOSE 133* 115* 154* 150* 265*  BUN 49* 39* 36* 39* 37*  CREATININE 2.90* 2.42* 2.42* 2.61* 2.46*  CALCIUM 8.4* 8.4* 8.6* 8.6* 8.5*  MG  --  2.6*  --   --   --    GFR: Estimated Creatinine Clearance: 23.5 mL/min (by C-G formula based on Cr of 2.46). Liver Function Tests:  Recent Labs Lab 06/06/16 1445  PROT 6.1*   No results for input(s): LIPASE, AMYLASE in the last 168 hours. No results for input(s): AMMONIA in the last 168 hours. Coagulation Profile: No results for input(s): INR, PROTIME in the last 168 hours. Cardiac Enzymes:  Recent Labs Lab 06/13/2016 2007 06/27/2016 2343 06/06/16 0534 06/06/16 1127  TROPONINI 0.03 <0.03 0.03 0.03   BNP (last 3 results)  Recent Labs  05/14/16 1012  PROBNP 1454.0*   HbA1C: No results for input(s): HGBA1C in the last 72 hours. CBG:  Recent Labs Lab 06/11/16 1957 06/12/16 0025 06/12/16 0532 06/12/16 0738 06/12/16 1111  GLUCAP 196* 171* 145* 161* 201*   Lipid Profile: No results for input(s): CHOL, HDL, LDLCALC, TRIG, CHOLHDL, LDLDIRECT in the last 72 hours. Thyroid Function Tests: No results for input(s): TSH, T4TOTAL, FREET4, T3FREE, THYROIDAB in the last 72 hours. Anemia Panel: No results for input(s): VITAMINB12, FOLATE, FERRITIN, TIBC, IRON, RETICCTPCT in the  last 72 hours. Sepsis Labs: No results for input(s): PROCALCITON, LATICACIDVEN in the last 168 hours.  Recent Results (from the past 240 hour(s))  Blood culture (routine x 2)     Status: None   Collection Time: 06/13/2016 10:18 PM  Result Value Ref Range Status   Specimen Description BLOOD LEFT ARM  Final   Special Requests IN PEDIATRIC BOTTLE 3CC  Final   Culture NO GROWTH 5 DAYS  Final   Report Status 06/10/2016 FINAL  Final  Blood culture (routine x 2)     Status: None   Collection Time: 06/21/2016 10:23 PM  Result Value Ref Range Status   Specimen Description BLOOD LEFT HAND  Final   Special Requests IN PEDIATRIC BOTTLE 3CC  Final   Culture NO GROWTH 5 DAYS  Final   Report Status  06/10/2016 FINAL  Final  Culture, body fluid-bottle     Status: None   Collection Time: 06/06/16  2:43 PM  Result Value Ref Range Status   Specimen Description PLEURAL LEFT  Final   Special Requests AEROBIC BOTTLE ONLY 6CC  Final   Culture NO GROWTH 5 DAYS  Final   Report Status 06/11/2016 FINAL  Final  Gram stain     Status: None   Collection Time: 06/06/16  2:43 PM  Result Value Ref Range Status   Specimen Description PLEURAL LEFT  Final   Special Requests NONE  Final   Gram Stain   Final    ABUNDANT WBC PRESENT,BOTH PMN AND MONONUCLEAR NO ORGANISMS SEEN    Report Status 06/06/2016 FINAL  Final         Radiology Studies: No results found.      Scheduled Meds: . antiseptic oral rinse  7 mL Mouth Rinse q12n4p  . aspirin EC  81 mg Oral QHS  . budesonide (PULMICORT) nebulizer solution  0.5 mg Nebulization BID  . carvedilol  3.125 mg Oral BID WC  . chlorhexidine  15 mL Mouth Rinse BID  . clopidogrel  75 mg Oral Q breakfast  . insulin aspart  0-9 Units Subcutaneous TID WC & HS  . ipratropium  0.5 mg Nebulization TID  . isosorbide dinitrate  10 mg Oral BID  . levalbuterol  0.63 mg Nebulization TID  . ranolazine  500 mg Oral BID  . sodium chloride flush  3 mL Intravenous Q12H  . sodium  chloride flush  3 mL Intravenous Q12H   Continuous Infusions:    LOS: 7 days    Time spent: > 35 minutes   Velvet Bathe, MD [facility] Pager [phone]  If 7PM-7AM, please contact night-coverage [email or web address] Password [email or web address] 06/12/2016, 3:50 PM

## 2016-06-13 DIAGNOSIS — Z66 Do not resuscitate: Secondary | ICD-10-CM | POA: Insufficient documentation

## 2016-06-13 DIAGNOSIS — Z515 Encounter for palliative care: Secondary | ICD-10-CM | POA: Insufficient documentation

## 2016-06-13 LAB — GLUCOSE, CAPILLARY
GLUCOSE-CAPILLARY: 188 mg/dL — AB (ref 65–99)
GLUCOSE-CAPILLARY: 169 mg/dL — AB (ref 65–99)
Glucose-Capillary: 167 mg/dL — ABNORMAL HIGH (ref 65–99)
Glucose-Capillary: 200 mg/dL — ABNORMAL HIGH (ref 65–99)
Glucose-Capillary: 225 mg/dL — ABNORMAL HIGH (ref 65–99)

## 2016-06-13 LAB — BASIC METABOLIC PANEL
Anion gap: 11 (ref 5–15)
BUN: 48 mg/dL — AB (ref 6–20)
CALCIUM: 8.7 mg/dL — AB (ref 8.9–10.3)
CO2: 21 mmol/L — AB (ref 22–32)
CREATININE: 2.38 mg/dL — AB (ref 0.61–1.24)
Chloride: 99 mmol/L — ABNORMAL LOW (ref 101–111)
GFR calc Af Amer: 27 mL/min — ABNORMAL LOW (ref >60)
GFR calc non Af Amer: 23 mL/min — ABNORMAL LOW (ref >60)
GLUCOSE: 142 mg/dL — AB (ref 65–99)
Potassium: 5 mmol/L (ref 3.5–5.1)
Sodium: 131 mmol/L — ABNORMAL LOW (ref 135–145)

## 2016-06-13 MED ORDER — MORPHINE SULFATE (CONCENTRATE) 10 MG/0.5ML PO SOLN
5.0000 mg | ORAL | Status: DC | PRN
  Administered 2016-06-13 – 2016-06-15 (×2): 5 mg via ORAL
  Filled 2016-06-13 (×2): qty 0.5

## 2016-06-13 MED ORDER — FUROSEMIDE 10 MG/ML IJ SOLN
60.0000 mg | Freq: Once | INTRAMUSCULAR | Status: AC
  Administered 2016-06-13: 60 mg via INTRAVENOUS
  Filled 2016-06-13: qty 6

## 2016-06-13 NOTE — Progress Notes (Signed)
Daily Progress Note   Patient Name: Alan Ford       Date: 06/13/2016 DOB: 1930/07/04  Age: 82 y.o. MRN#: [id number] Attending Physician: Alan Bathe, MD Primary Care Physician: Alan Argyle, MD Admit Date: 06/14/2016  Reason for Consultation/Follow-up: Establishing goals of care, Hospice Evaluation and Psychosocial/spiritual support  Subjective: -continued conversation with patient and his wife regarding diagnosis, prognosis, GOC and disposition options  - discussed that is is likely that with his multiple co-morbidities and poor functional status it is unlikely that he will be eligible for any aggressive oncologics for his cancer diagnosis, he hopes to be able to discuss with Dr Alan Ford  -Mr Alan Ford and his wife verbalize an understanding of his poor prognosis   Length of Stay: 8  Current Medications: Scheduled Meds:  . antiseptic oral rinse  7 mL Mouth Rinse q12n4p  . aspirin EC  81 mg Oral QHS  . budesonide (PULMICORT) nebulizer solution  0.5 mg Nebulization BID  . carvedilol  3.125 mg Oral BID WC  . chlorhexidine  15 mL Mouth Rinse BID  . clopidogrel  75 mg Oral Q breakfast  . insulin aspart  0-9 Units Subcutaneous TID WC & HS  . ipratropium  0.5 mg Nebulization TID  . isosorbide dinitrate  10 mg Oral BID  . levalbuterol  0.63 mg Nebulization TID  . ranolazine  500 mg Oral BID  . sodium chloride flush  3 mL Intravenous Q12H  . sodium chloride flush  3 mL Intravenous Q12H    Continuous Infusions:    PRN Meds: sodium chloride, acetaminophen, HYDROcodone-acetaminophen, levalbuterol, sodium chloride flush, zolpidem  Physical Exam  Constitutional: He appears cachectic. He appears ill.  Cardiovascular: Tachycardia present.   Pulmonary/Chest: He has decreased breath  sounds in the left middle field and the left lower field.  Skin: Skin is warm and dry.            Vital Signs: BP 142/69 mmHg  Pulse 105  Temp(Src) 98.1 F (36.7 C) (Oral)  Resp 20  Ht 5' 10.5" (1.791 m)  Wt 76.522 kg (168 lb 11.2 oz)  BMI 23.86 kg/m2  SpO2 93% SpO2: SpO2: 93 % O2 Device: O2 Device: Venturi Mask O2 Flow Rate: O2 Flow Rate (L/min): 6 L/min  Intake/output summary:  Intake/Output Summary (Last 24 hours) at 06/13/16 [phone]  Last data filed at 06/13/16 8502  Gross per 24 hour  Intake    720 ml  Output    975 ml  Net   -255 ml   LBM: Last BM Date: 06/10/16 Baseline Weight: Weight: 75.751 kg (167 lb) Most recent weight: Weight: 76.522 kg (168 lb 11.2 oz) (scale a)       Palliative Assessment/Data:  30 %    Flowsheet Rows        Most Recent Value   Intake Tab    Referral Department  Hospitalist   Unit at Time of Referral  Cardiac/Telemetry Unit   Palliative Care Primary Diagnosis  Cancer   Date Notified  06/11/16   Palliative Care Type  New Palliative care   Reason for referral  Clarify Goals of Care   Date of Admission  06/09/2016   Date first seen by Palliative Care  06/12/16   # of days Palliative referral response time  1 Day(s)   # of days IP prior to Palliative referral  6   Clinical Assessment    Psychosocial & Spiritual Assessment    Palliative Care Outcomes       Patient Active Problem List   Diagnosis Date Noted  . DNR (do not resuscitate)   . Palliative care encounter   . Lymphadenopathy   . Pleural effusion on left 06/06/2016  . Acute on chronic renal failure ([place]) 06/01/2016  . Acute on chronic respiratory failure with hypoxia ([place]) 06/26/2016  . Lung mass 05/29/2016  . Dyslipidemia associated with type 2 diabetes mellitus ([place])   . Troponin level elevated   . Pleural effusion   . Coronary artery disease involving native coronary artery of native heart without angina pectoris   . Aortic calcification ([place]) 05/16/2016  . CHF  (congestive heart failure) ([place]) 05/15/2016  . CRF (chronic renal failure) 05/15/2016  . NSTEMI (non-ST elevated myocardial infarction) ([place]) 05/14/2016  . Acute on chronic congestive heart failure ([place])   . Essential hypertension   . Simple chronic bronchitis ([place])   . MGUS (monoclonal gammopathy of unknown significance) 12/12/2015  . Obstructive sleep apnea 11/17/2014  . Chest pain 06/14/2014  . COPD (chronic obstructive pulmonary disease) ([place]) 12/14/2013  . ILD (interstitial lung disease) ([place]) 12/14/2013  . URI (upper respiratory infection) 12/14/2013  . Dyspnea 07/10/2013  . Abdominal aneurysm without mention of rupture 12/09/2012  . High blood pressure 07/24/2011  . High cholesterol 07/24/2011  . Diabetes mellitus ([place]) 07/24/2011  . Chronic diastolic heart failure ([place]) 07/24/2011    Palliative Care Assessment & Plan   Patient Profile: 82 y.o. male admitted on 06/28/2016 NSTEMI 2 weeks ago with cath/stent placed in RCA, copd on 3 liters [place] o2 at home, new lung mass diagnosis, CHF, afib comes in with progressive worsening sob and cough since he was discharged. Has been coughing which is worse while lying down and is sometimes productive. He reports worsening orthopnea and PND. Denies fevers or chills. Denies any worsening of his peripheral edema. He has intermittent chest pain for the past 3 weeks. . He had a left pleural effusion and lung mass.   Thoracentesis, 06-06-16 for 1.5 liters, with continued re accumulation. Continued physical and functional decline  Patient faces advanced directive decisions and anticipatory care needs   Recommendations/Plan:   Await input from oncology, patient is hopeful to speak with Dr Alan Ford ( he treated his wife in the past)  Hopeful for plurex-cath placement for symptom management asap ( per Dr Alan Ford no  need to wait for pleur-ex cath placement, fluid is reaccumulating)  Roxanol 5 mg po/sl every 3 hrs prn for  dyspnea  Disposition dependent on outcomes over the next few days    Code Status:    Code Status Orders        Start     Ordered   06/12/16 1435  Do not attempt resuscitation (DNR)   Continuous    Question Answer Comment  In the event of cardiac or respiratory ARREST Do not call a "code blue"   In the event of cardiac or respiratory ARREST Do not perform Intubation, CPR, defibrillation or ACLS   In the event of cardiac or respiratory ARREST Use medication by any route, position, wound care, and other measures to relive pain and suffering. May use oxygen, suction and manual treatment of airway obstruction as needed for comfort.      06/12/16 1434    Code Status History    Date Active Date Inactive Code Status Order ID Comments User Context   06/01/2016 11:23 PM 06/12/2016  2:34 PM Full Code [id number]  Phillips Grout, MD Inpatient   05/14/2016  5:20 PM 05/22/2016  4:40 PM Full Code [id number]  Waldemar Dickens, MD ED   05/14/2016  5:20 PM 05/14/2016  5:20 PM Full Code [id number]  Waldemar Dickens, MD ED    Advance Directive Documentation        Most Recent Value   Type of Advance Directive  Healthcare Power of [place], Living will   Pre-existing out of facility DNR order (yellow form or pink MOST form)     "MOST" Form in Place?         Prognosis:   < 3 months  Discharge Planning:  To Be Determined-- Patient and his wife trying to make informed decisions regarding discharge options.  On one hand they are looking to a SNF for rehabilitation services in hope for improvement, on the other hand they are coming to terms with the overall poor prognosis and are leaning toward home with hospice.    At Mr izayiah tibbitts, his wife will leave [facility] tomorrow to go to the [place] for her grand-daughter wedding.  She hopes no disposition will occur until Monday when she present.  Care plan was discussed with Dr Wendee Beavers, Dr Alan Ford  Thank you for allowing the Palliative Medicine Team to  assist in the care of this patient.   Time In: 1630 Time Out: 1715 Total Time 45 min Prolonged Time Billed  no       Greater than 50%  of this time was spent counseling and coordinating care related to the above assessment and plan.  Wadie Lessen, NP  Please contact Palliative Medicine Team phone at [phone] for questions and concerns.

## 2016-06-13 NOTE — Progress Notes (Addendum)
[name], male   DOB: Oct 07, 1930, 82 y.o.   MRN: [id number]   Request received for Left PleurX catheter placement  Pt has had 1 thoracentesis with CCM 6/7 1.5 liters Cyto: poorly differentiated carcinoma  Palliative has seen pt Rec: Palliative Care and probable Hospice   Pt actively on Plavix Would need off 5 days for procedure to be safely performed (cardiac stent placed 05/17/16)----ok to come off per Cardiology?  Discussed with Dr Pascal Lux Recommendation: Maybe another thora to determine amount/rate of re accumulation- when needed Be sure No role for Oncology---No treatment planned  If No plan for tx and Inocencio Homes has good amount of accumulation Would HOLD Plavix 5 days prior to procedure  Can be scheduled as OP if feel appropriate  Let us know  [phone]; Jannifer Franklin PAC-IR

## 2016-06-13 NOTE — Progress Notes (Signed)
PT Cancellation Note  Patient Details Name: Alan Ford MRN: [id number] DOB: 10-25-1930   Cancelled Treatment:    Reason Eval/Treat Not Completed: Patient declined, no reason specified (pt reports "pain all over" and asks PT to return tomorrow).     Collie Siad PT, DPT  Pager: [phone] Phone: [phone] 06/13/2016, 2:49 PM

## 2016-06-13 NOTE — Progress Notes (Signed)
Name: Alan Ford MRN: [id number] DOB: 12/30/1930    ADMISSION DATE:  06/11/2016 CONSULTATION DATE:  6/7  REFERRING MD :  Wendee Beavers ([facility])   CHIEF COMPLAINT:  Respiratory failure   BRIEF PATIENT DESCRIPTION: 82yo male former smoker with hx DM, chronic dCHF, CKD 3-4, OSA on CPAP, ILD (NSIP) and COPD on 3L home O2 followed by Dr. Lamonte Sakai as well as known L hilar lung mass.  Had recent admit (5/15-5/23) for NSTEMI with cath and RCA stent placed.  He returned 6/6 with c/o progressive dyspnea, orthopnea, cough.  He was admitted by [facility], CXR revealed slight increase in known pleural effusion and worsening pulmonary edema.  He was started on bipap and admitted to SDU where PCCM was consulted to assist with pulmonary needs. He underwent L thoracentesis with 1.5 L removed 6/7. Fluid was exudate by LDH only (marked pleural elevation to 1217, serum 188).    STUDIES:  L thora 6/7: LDH ratio 6.5, Protein ratio 0.5. Exudate by LDH, Gram stain neg. Culture *, Glucose 90, Cholesterol *, pH 7.7,  ADA *  SUBJECTIVE:  Pt drowsy but oriented on 6L [place]. Still has intermittent SOB denies chest pain, wheezing, nausea, vomiting or chills  VITAL SIGNS: Temp:  [98.1 F (36.7 C)-98.8 F (37.1 C)] 98.6 F (37 C) (06/14 1200) Pulse Rate:  [100-105] 100 (06/14 1200) Resp:  [20] 20 (06/14 1200) BP: (113-143)/(69-78) 113/78 mmHg (06/14 1200) SpO2:  [85 %-93 %] 92 % (06/14 1200) FiO2 (%):  [40 %] 40 % (06/14 0734) Weight:  [168 lb 11.2 oz (76.522 kg)] 168 lb 11.2 oz (76.522 kg) (06/14 0502)  PHYSICAL EXAMINATION: General:  Pleasant male, chronically ill appearing, NAD sitting OOB in chair  Neuro:  drowsy, alert, appropriate, MAE, gen weakness  HEENT:  Mm moist, no JVD  Cardiovascular:  S1s2, rrr, no murmur or gallop Lungs:  Diminished throughout resps even, non labored on 6L [place]  Abdomen:  +BS x4 Round, soft, non tender  Musculoskeletal:  Warm and dry, scant BLE edema    Recent Labs Lab 06/10/16 0409  06/11/16 1050 06/12/16 2339  NA 132* 131* 131*  K 5.4* 5.0 5.0  CL 103 99* 99*  CO2 16* 21* 21*  BUN 39* 37* 48*  CREATININE 2.61* 2.46* 2.38*  GLUCOSE 150* 265* 142*    Recent Labs Lab 06/10/16 0409 06/11/16 1050 06/12/16 0045  HGB 9.8* 9.4* 8.8*  HCT 31.7* 30.2* 27.6*  WBC 15.0* 13.5* 13.9*  PLT 333 326 307   No results found.  ASSESSMENT / PLAN:   Acute on chronic respiratory failure - multifactorial r/t enlarging L pleural effusion and pulmonary edema in setting decompensated diastolic dysfunction c/b acute on CKD 3-4 with underlying mod/severe COPD and ILD.  Interstitial Lung disease COPD without acute exacerbation OSA on CPAP L malignant pleural effusion s/p thora 6/7 (Exudate by LDH [1217] only). 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   PLAN -  Pt would like aggressive treatment at this time Dr. Wendee Beavers contacted Dr. [name] on 6/13 Per IR recommending repeat thoracentesis however pt had a left thoracentesis on 6/7 with re accumulation by 6/11 due to malignancy recommend placement of pleur-x catheter for symptom management of malignant effusion - d/w patient at length and he is agreeable  Will d/c Plavix x 5 days per IR recommendation for pleur-x catheter placement will resume once catheter placed  Palliative care consulted appreciate input PRN bipap  Supplemental O2 as needed to keep sats >90% -  will need home O2  Scheduled BD  Pulmonary hygiene   Acute on chronic dCHF  AKI on CKD CAD - s/p recent RCA stent  PLAN -  - Per primary team  Marda Stalker, [place]   Attending Note:  I have examined patient, reviewed labs, studies and notes. I have discussed the case with D Blakeney, and I agree with the data and plans as amended above. Pt to see oncology to discuss care options. He will likely need Pleurx but OK to defer for now to assess whether fluid reaccumlates after serial thoracenteses depending on  IR plans.   [name], MD, PhD 06/13/2016, 3:59 PM [facility] [phone] or if no answer [phone]

## 2016-06-13 NOTE — Progress Notes (Signed)
PROGRESS NOTE    Alan Ford  WUJ:[id number] DOB: 04/26/1930 DOA: 06/23/2016 PCP: Mathews Argyle, MD   Brief Narrative:  82 yo male NSTEMI 2 weeks ago with cath/stent placed in RCA, copd on 3 liters  o2 at home, new lung mass uncertain diagnosis, CHF, afib comes in with progressive worsening sob and cough since he was discharged. Has been coughing which is worse while lying down and is sometimes productive. He reports worsening orthopnea and PND. Denies fevers or chills. Denies any worsening of his peripheral edema. He has intermittent chest pain for the past 3 weeks. No hemoptysis. He had a left pleural effusion and lung mass. He has seen his cardiologist and pulmonologist in the last couple weeks and it sounds like a thoracentesis was entertained but did not want to be done due To him being on plavix. Cxr shows worsening left pleural effusion and worsening hypoxic hypoxia.  Assessment & Plan:   Principal Problem:   Acute on chronic respiratory failure with hypoxia ([place]) - Consulted Pulmonologist for assistance with management - S/p thoracentesis with improvement in respiratory status afterwards. - Will add lasix - Pulmonology on board and recommended pleurx catheter. Consulted IR 06/12/16. Also agree with palliative care for symptom management and goals of care. Pt would like aggressive treatment at this time. - Contacted the cancer center and left message for Dr. [name] 06/12/16  Active Problems:   Chronic diastolic heart failure ([place]) - Pt on isordil and B blocker - on prior admission patient diagnosed with new onset systolic CHF with EF of 95-62 percent. Diagnostic cath showed severe 3VD. Reportedly the culprit lesion was RCA Patient also had more diffuse LAD disease and long bifurcation disease in OM. 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 Per cardiology notes on 06/01/2016 that finding and COPD made him non operative. Pt had artherectomy of RCA by Dr. Tamala Julian  05/17/16    Dyspnea   COPD (chronic obstructive pulmonary disease) (HCC)/ ILD (interstitial lung disease) ([place]) - pulm on board    Essential hypertension - continue current antihypertensive regimen    Coronary artery disease involving native coronary artery of native heart without angina pectoris - aspirin    Lung mass   Pleural effusion on left - s/p thoracentesis, effusion has been reaccumulating as such consulted IR for Pleurx catheter placement. - administer lasix    Acute on chronic renal failure (HCC) - monitor S creatinine  DVT prophylaxis: SCD's Code Status: full Family Communication: d/c patient directly Disposition Plan: Pending improvement in respiratory status and final recommendations by specialist involved.   Consultants:   Pulmonology   Procedures:   None   Antimicrobials: None   Subjective: Pt complaining of increased sob today.  Objective: Filed Vitals:   06/12/16 1935 06/12/16 2006 06/12/16 2140 06/13/16 0502  BP:   143/71 142/69  Pulse:   104 105  Temp:   98.8 F (37.1 C) 98.1 F (36.7 C)  TempSrc:   Oral Oral  Resp:   20 20  Height:      Weight:    76.522 kg (168 lb 11.2 oz)  SpO2: 91% 91% 92% 93%    Intake/Output Summary (Last 24 hours) at 06/13/16 1007 Last data filed at 06/13/16 0635  Gross per 24 hour  Intake    480 ml  Output    975 ml  Net   -495 ml   Filed Weights   06/11/16 0601 06/12/16 0612 06/13/16 0502  Weight: 76.386 kg (168 lb 6.4 oz)  76.658 kg (169 lb) 76.522 kg (168 lb 11.2 oz)    Examination:  General exam: Alert and awake, resting comfortably in nad. Respiratory system: decreased breath sound over left lung field at bases, + rhales, equal chest rise, no wheezes Cardiovascular system: S1 & S2 heard, RRR. Gastrointestinal system: Abdomen is nondistended, soft and nontender. No organomegaly or masses felt. Normal bowel sounds heard. Central nervous system: Alert and oriented. No focal neurological  deficits. Extremities: Symmetric, equal tone Skin: No rashes, lesions or ulcers Psychiatry: Judgement and insight appear normal. Mood & affect appropriate.   Data Reviewed: I have personally reviewed following labs and imaging studies  CBC:  Recent Labs Lab 06/06/16 1445 06/09/16 0224 06/10/16 0409 06/11/16 1050 06/12/16 0045  WBC  --  13.2* 15.0* 13.5* 13.9*  HGB  --  9.8* 9.8* 9.4* 8.8*  HCT 30.6* 32.0* 31.7* 30.2* 27.6*  MCV  --  97.9 96.1 95.3 93.6  PLT  --  354 333 326 026   Basic Metabolic Panel:  Recent Labs Lab 06/07/16 0315 06/09/16 0224 06/10/16 0409 06/11/16 1050 06/12/16 2339  NA 133* 133* 132* 131* 131*  K 4.8 5.2* 5.4* 5.0 5.0  CL 103 103 103 99* 99*  CO2 19* 19* 16* 21* 21*  GLUCOSE 115* 154* 150* 265* 142*  BUN 39* 36* 39* 37* 48*  CREATININE 2.42* 2.42* 2.61* 2.46* 2.38*  CALCIUM 8.4* 8.6* 8.6* 8.5* 8.7*  MG 2.6*  --   --   --   --    GFR: Estimated Creatinine Clearance: 24.2 mL/min (by C-G formula based on Cr of 2.38). Liver Function Tests:  Recent Labs Lab 06/06/16 1445  PROT 6.1*   No results for input(s): LIPASE, AMYLASE in the last 168 hours. No results for input(s): AMMONIA in the last 168 hours. Coagulation Profile: No results for input(s): INR, PROTIME in the last 168 hours. Cardiac Enzymes:  Recent Labs Lab 06/06/16 1127  TROPONINI 0.03   BNP (last 3 results)  Recent Labs  05/14/16 1012  PROBNP 1454.0*   HbA1C: No results for input(s): HGBA1C in the last 72 hours. CBG:  Recent Labs Lab 06/12/16 1636 06/12/16 1958 06/12/16 2326 06/13/16 0409 06/13/16 0744  GLUCAP 224* 189* 149* 167* 188*   Lipid Profile: No results for input(s): CHOL, HDL, LDLCALC, TRIG, CHOLHDL, LDLDIRECT in the last 72 hours. Thyroid Function Tests: No results for input(s): TSH, T4TOTAL, FREET4, T3FREE, THYROIDAB in the last 72 hours. Anemia Panel: No results for input(s): VITAMINB12, FOLATE, FERRITIN, TIBC, IRON, RETICCTPCT in the last  72 hours. Sepsis Labs: No results for input(s): PROCALCITON, LATICACIDVEN in the last 168 hours.  Recent Results (from the past 240 hour(s))  Blood culture (routine x 2)     Status: None   Collection Time: 06/14/2016 10:18 PM  Result Value Ref Range Status   Specimen Description BLOOD LEFT ARM  Final   Special Requests IN PEDIATRIC BOTTLE 3CC  Final   Culture NO GROWTH 5 DAYS  Final   Report Status 06/10/2016 FINAL  Final  Blood culture (routine x 2)     Status: None   Collection Time: 06/18/2016 10:23 PM  Result Value Ref Range Status   Specimen Description BLOOD LEFT HAND  Final   Special Requests IN PEDIATRIC BOTTLE 3CC  Final   Culture NO GROWTH 5 DAYS  Final   Report Status 06/10/2016 FINAL  Final  Culture, body fluid-bottle     Status: None   Collection Time: 06/06/16  2:43 PM  Result Value Ref Range Status   Specimen Description PLEURAL LEFT  Final   Special Requests AEROBIC BOTTLE ONLY 6CC  Final   Culture NO GROWTH 5 DAYS  Final   Report Status 06/11/2016 FINAL  Final  Gram stain     Status: None   Collection Time: 06/06/16  2:43 PM  Result Value Ref Range Status   Specimen Description PLEURAL LEFT  Final   Special Requests NONE  Final   Gram Stain   Final    ABUNDANT WBC PRESENT,BOTH PMN AND MONONUCLEAR NO ORGANISMS SEEN    Report Status 06/06/2016 FINAL  Final         Radiology Studies: No results found.      Scheduled Meds: . antiseptic oral rinse  7 mL Mouth Rinse q12n4p  . aspirin EC  81 mg Oral QHS  . budesonide (PULMICORT) nebulizer solution  0.5 mg Nebulization BID  . carvedilol  3.125 mg Oral BID WC  . chlorhexidine  15 mL Mouth Rinse BID  . clopidogrel  75 mg Oral Q breakfast  . furosemide  60 mg Intravenous Once  . insulin aspart  0-9 Units Subcutaneous TID WC & HS  . ipratropium  0.5 mg Nebulization TID  . isosorbide dinitrate  10 mg Oral BID  . levalbuterol  0.63 mg Nebulization TID  . ranolazine  500 mg Oral BID  . sodium chloride flush   3 mL Intravenous Q12H  . sodium chloride flush  3 mL Intravenous Q12H   Continuous Infusions:    LOS: 8 days    Time spent: > 35 minutes   Velvet Bathe, MD [facility] Pager [phone]  If 7PM-7AM, please contact night-coverage [email or web address] Password [email or web address] 06/13/2016, 10:07 AM

## 2016-06-14 ENCOUNTER — Inpatient Hospital Stay (HOSPITAL_COMMUNITY): Payer: Medicare Other

## 2016-06-14 DIAGNOSIS — I10 Essential (primary) hypertension: Secondary | ICD-10-CM

## 2016-06-14 DIAGNOSIS — D631 Anemia in chronic kidney disease: Secondary | ICD-10-CM

## 2016-06-14 DIAGNOSIS — I429 Cardiomyopathy, unspecified: Secondary | ICD-10-CM

## 2016-06-14 DIAGNOSIS — I4891 Unspecified atrial fibrillation: Secondary | ICD-10-CM

## 2016-06-14 DIAGNOSIS — E119 Type 2 diabetes mellitus without complications: Secondary | ICD-10-CM

## 2016-06-14 DIAGNOSIS — I509 Heart failure, unspecified: Secondary | ICD-10-CM

## 2016-06-14 DIAGNOSIS — N189 Chronic kidney disease, unspecified: Secondary | ICD-10-CM

## 2016-06-14 DIAGNOSIS — R0602 Shortness of breath: Secondary | ICD-10-CM

## 2016-06-14 DIAGNOSIS — C3492 Malignant neoplasm of unspecified part of left bronchus or lung: Principal | ICD-10-CM

## 2016-06-14 DIAGNOSIS — J910 Malignant pleural effusion: Secondary | ICD-10-CM

## 2016-06-14 DIAGNOSIS — I2510 Atherosclerotic heart disease of native coronary artery without angina pectoris: Secondary | ICD-10-CM

## 2016-06-14 DIAGNOSIS — Z720 Tobacco use: Secondary | ICD-10-CM

## 2016-06-14 DIAGNOSIS — J849 Interstitial pulmonary disease, unspecified: Secondary | ICD-10-CM

## 2016-06-14 LAB — COMPREHENSIVE METABOLIC PANEL
ALT: 33 U/L (ref 17–63)
AST: 40 U/L (ref 15–41)
Albumin: 1.7 g/dL — ABNORMAL LOW (ref 3.5–5.0)
Alkaline Phosphatase: 79 U/L (ref 38–126)
Anion gap: 15 (ref 5–15)
BILIRUBIN TOTAL: 0.4 mg/dL (ref 0.3–1.2)
BUN: 53 mg/dL — AB (ref 6–20)
CO2: 21 mmol/L — ABNORMAL LOW (ref 22–32)
CREATININE: 2.54 mg/dL — AB (ref 0.61–1.24)
Calcium: 9.1 mg/dL (ref 8.9–10.3)
Chloride: 96 mmol/L — ABNORMAL LOW (ref 101–111)
GFR, EST AFRICAN AMERICAN: 25 mL/min — AB (ref >60)
GFR, EST NON AFRICAN AMERICAN: 22 mL/min — AB (ref >60)
Glucose, Bld: 206 mg/dL — ABNORMAL HIGH (ref 65–99)
POTASSIUM: 5 mmol/L (ref 3.5–5.1)
Sodium: 132 mmol/L — ABNORMAL LOW (ref 135–145)
TOTAL PROTEIN: 6.2 g/dL — AB (ref 6.5–8.1)

## 2016-06-14 LAB — BASIC METABOLIC PANEL
Anion gap: 13 (ref 5–15)
BUN: 52 mg/dL — AB (ref 6–20)
CHLORIDE: 98 mmol/L — AB (ref 101–111)
CO2: 21 mmol/L — AB (ref 22–32)
CREATININE: 2.39 mg/dL — AB (ref 0.61–1.24)
Calcium: 9 mg/dL (ref 8.9–10.3)
GFR calc Af Amer: 27 mL/min — ABNORMAL LOW (ref >60)
GFR calc non Af Amer: 23 mL/min — ABNORMAL LOW (ref >60)
Glucose, Bld: 164 mg/dL — ABNORMAL HIGH (ref 65–99)
POTASSIUM: 4.9 mmol/L (ref 3.5–5.1)
SODIUM: 132 mmol/L — AB (ref 135–145)

## 2016-06-14 LAB — GLUCOSE, CAPILLARY
GLUCOSE-CAPILLARY: 131 mg/dL — AB (ref 65–99)
GLUCOSE-CAPILLARY: 144 mg/dL — AB (ref 65–99)
GLUCOSE-CAPILLARY: 180 mg/dL — AB (ref 65–99)
GLUCOSE-CAPILLARY: 262 mg/dL — AB (ref 65–99)
Glucose-Capillary: 159 mg/dL — ABNORMAL HIGH (ref 65–99)
Glucose-Capillary: 181 mg/dL — ABNORMAL HIGH (ref 65–99)

## 2016-06-14 LAB — PREALBUMIN: Prealbumin: 10 mg/dL — ABNORMAL LOW (ref 18–38)

## 2016-06-14 MED ORDER — HEPARIN SODIUM (PORCINE) 5000 UNIT/ML IJ SOLN
5000.0000 [IU] | Freq: Three times a day (TID) | INTRAMUSCULAR | Status: DC
  Administered 2016-06-15 – 2016-06-19 (×12): 5000 [IU] via SUBCUTANEOUS
  Filled 2016-06-14 (×13): qty 1

## 2016-06-14 MED ORDER — TECHNETIUM TC 99M MEDRONATE IV KIT
25.0000 | PACK | Freq: Once | INTRAVENOUS | Status: AC | PRN
  Administered 2016-06-14: 25 via INTRAVENOUS

## 2016-06-14 MED ORDER — CLOPIDOGREL BISULFATE 75 MG PO TABS
75.0000 mg | ORAL_TABLET | Freq: Every day | ORAL | Status: DC
  Administered 2016-06-15 – 2016-06-20 (×6): 75 mg via ORAL
  Filled 2016-06-14 (×6): qty 1

## 2016-06-14 MED ORDER — DIATRIZOATE MEGLUMINE & SODIUM 66-10 % PO SOLN
ORAL | Status: AC
  Filled 2016-06-14: qty 30

## 2016-06-14 NOTE — Progress Notes (Addendum)
PROGRESS NOTE  Alan Ford  YIF:[id number] DOB: 04-26-1930 DOA: 06/19/2016 PCP: Mathews Argyle, MD  Brief Narrative:   82yo male former smoker with hx DM, chronic dCHF, atrial fibrillation, CKD 3-4, OSA on CPAP, ILD (NSIP) and COPD on 3L home O2 followed by Dr. Lamonte Sakai as well as known L hilar lung mass. Had recent admit (5/15-5/23) for NSTEMI with cath and RCA stent placed. He returned 6/6 with c/o progressive dyspnea, orthopnea, cough. He was admitted by [facility], CXR revealed slight increase in known pleural effusion and worsening pulmonary edema. He was started on bipap and admitted to SDU where PCCM was consulted to assist with pulmonary needs. He underwent L thoracentesis with 1.5 L removed 6/7. Fluid was exudate by LDH only (marked pleural elevation to 1217, serum 188).   Assessment & Plan:   Principal Problem:   Acute on chronic respiratory failure with hypoxia (HCC) Active Problems:   Chronic diastolic heart failure (HCC)   Dyspnea   COPD (chronic obstructive pulmonary disease) (HCC)   ILD (interstitial lung disease) (HCC)   Essential hypertension   CHF (congestive heart failure) (HCC)   Coronary artery disease involving native coronary artery of native heart without angina pectoris   Lung mass   Pleural effusion on left   Acute on chronic renal failure (HCC)   Lymphadenopathy   DNR (do not resuscitate)   Palliative care encounter   Acute on chronic respiratory failure with hypoxia ([place]) likely secondary to malignant pleural effusion, undifferentiated, likely lung primary cancer - appreciate pulmonology and oncology assistance - S/p thoracentesis with improvement in respiratory status after removal of 1.5L of fluid - continue lasix and will try to manage with diuretics for now -  Although pleurx catheter has been discussed, patient had a drug-eluting stent placed less than 30 days ago and is high risk for in-stent thrombosis -  Bone scan, MRI brain ordered by  oncology -  May be candidate for single line chemotherapy per Dr. [name]  Coronary artery disease, severe 3V disease, not a candidate for CABG due to COPD  Chest pain free -  Had DES placed to the RCA on 05/17/2016 -  Case discussed with cardiology who recommend NOT suspending aspirin or plavix at this time and would defer all elective procedures as long as possible.   -  Continue BB -  Not on statin due to age and frailty -  Continue ranolazine and imdur  Chronic diastolic and systolic heart failure (HCC), EF of 30-35 percent - Pt on isordil and B blocker - no ACEI/ARB or spironolactone due to CKD - diuretics as above  Paroxismal atrial fibrillation.  -  CHADs2vasc = 5    -  No a/c due to possible upcoming procedures and requirement for dual antiplatelet therapy after recent stent placement  Aortic atherosclerosis, continue aspirin.  No statin due to age and comorbidities  COPD (chronic obstructive pulmonary disease) (HCC)/ ILD (interstitial lung disease) (HCC) -  High perioperative risk, not a good surgical candidate  Essential hypertension - continue current antihypertensive regimen  CKD stage IV, minimize nephrotoxins and renally dose medications  Healing rib fractures -  PT eval pending -  Continue morphine per palliative care   DVT prophylaxis:  heparin Code Status:  DNR Family Communication:  Patient alone Disposition Plan:   PT evaluation.  Several additional tests ordered today by Oncology.  Possible SNF vs. Home with hospice.     Consultants:   Pulmonology  Procedures:  L thora 6/7:  1.5L removed  Antimicrobials:   none    Subjective: Abdomen feels bloated but softer than before.  Breathing has improved since yesterday after starting lasix.  Denies chest pains today.  Feels better also because he got to see Dr. [name] this morning and discuss the treatment options for his cancer.    Objective: Filed Vitals:   06/13/16 0502 06/13/16 1200 06/13/16  2146 06/14/16 0445  BP: 142/69 113/78 137/70 145/72  Pulse: 105 100 100 107  Temp: 98.1 F (36.7 C) 98.6 F (37 C) 98.1 F (36.7 C) 98 F (36.7 C)  TempSrc: Oral Oral Oral Oral  Resp: '20 20 20 20  '$ Height:      Weight: 76.522 kg (168 lb 11.2 oz)   75.388 kg (166 lb 3.2 oz)  SpO2: 93% 92% 92% 92%    Intake/Output Summary (Last 24 hours) at 06/14/16 1517 Last data filed at 06/14/16 0924  Gross per 24 hour  Intake    603 ml  Output    450 ml  Net    153 ml   Filed Weights   06/12/16 0612 06/13/16 0502 06/14/16 0445  Weight: 76.658 kg (169 lb) 76.522 kg (168 lb 11.2 oz) 75.388 kg (166 lb 3.2 oz)    Examination:  General exam:  Adult male.  No acute distress.  HEENT:  NCAT, MMM Respiratory system:  Diminished at the left base with rales heard at the bilateral bases.   Cardiovascular system:  RRR, positive S2 click.  No gallops.  Warm extremities Gastrointestinal system: Normal active bowel sounds, soft, mildly distended, nontender. MSK:  Normal tone and bulk, trace bilateral lower extremity edema Neuro:  Grossly intact    Data Reviewed: I have personally reviewed following labs and imaging studies  CBC:  Recent Labs Lab 06/09/16 0224 06/10/16 0409 06/11/16 1050 06/12/16 0045  WBC 13.2* 15.0* 13.5* 13.9*  HGB 9.8* 9.8* 9.4* 8.8*  HCT 32.0* 31.7* 30.2* 27.6*  MCV 97.9 96.1 95.3 93.6  PLT 354 333 326 476   Basic Metabolic Panel:  Recent Labs Lab 06/10/16 0409 06/11/16 1050 06/12/16 2339 06/14/16 0534 06/14/16 0917  NA 132* 131* 131* 132* 132*  K 5.4* 5.0 5.0 4.9 5.0  CL 103 99* 99* 98* 96*  CO2 16* 21* 21* 21* 21*  GLUCOSE 150* 265* 142* 164* 206*  BUN 39* 37* 48* 52* 53*  CREATININE 2.61* 2.46* 2.38* 2.39* 2.54*  CALCIUM 8.6* 8.5* 8.7* 9.0 9.1   GFR: Estimated Creatinine Clearance: 22.7 mL/min (by C-G formula based on Cr of 2.54). Liver Function Tests:  Recent Labs Lab 06/14/16 0917  AST 40  ALT 33  ALKPHOS 79  BILITOT 0.4  PROT 6.2*   ALBUMIN 1.7*   No results for input(s): LIPASE, AMYLASE in the last 168 hours. No results for input(s): AMMONIA in the last 168 hours. Coagulation Profile: No results for input(s): INR, PROTIME in the last 168 hours. Cardiac Enzymes: No results for input(s): CKTOTAL, CKMB, CKMBINDEX, TROPONINI in the last 168 hours. BNP (last 3 results)  Recent Labs  05/14/16 1012  PROBNP 1454.0*   HbA1C: No results for input(s): HGBA1C in the last 72 hours. CBG:  Recent Labs Lab 06/13/16 2013 06/14/16 0009 06/14/16 0443 06/14/16 0657 06/14/16 1258  GLUCAP 225* 144* 159* 180* 181*   Lipid Profile: No results for input(s): CHOL, HDL, LDLCALC, TRIG, CHOLHDL, LDLDIRECT in the last 72 hours. Thyroid Function Tests: No results for input(s): TSH, T4TOTAL, FREET4, T3FREE, THYROIDAB in the last 72 hours. Anemia  Panel: No results for input(s): VITAMINB12, FOLATE, FERRITIN, TIBC, IRON, RETICCTPCT in the last 72 hours. Urine analysis:    Component Value Date/Time   COLORURINE STRAW* 05/15/2016 1927   APPEARANCEUR CLEAR 05/15/2016 1927   LABSPEC 1.016 05/15/2016 1927   PHURINE 6.5 05/15/2016 1927   GLUCOSEU 250* 05/15/2016 1927   HGBUR SMALL* 05/15/2016 1927   BILIRUBINUR NEGATIVE 05/15/2016 1927   KETONESUR NEGATIVE 05/15/2016 1927   PROTEINUR 100* 05/15/2016 1927   UROBILINOGEN 0.2 10/06/2009 0923   NITRITE NEGATIVE 05/15/2016 1927   LEUKOCYTESUR NEGATIVE 05/15/2016 1927   Sepsis Labs: '@LABRCNTIP'$ (procalcitonin:4,lacticidven:4)  ) Recent Results (from the past 240 hour(s))  Blood culture (routine x 2)     Status: None   Collection Time: 06/26/2016 10:18 PM  Result Value Ref Range Status   Specimen Description BLOOD LEFT ARM  Final   Special Requests IN PEDIATRIC BOTTLE 3CC  Final   Culture NO GROWTH 5 DAYS  Final   Report Status 06/10/2016 FINAL  Final  Blood culture (routine x 2)     Status: None   Collection Time: 06/16/2016 10:23 PM  Result Value Ref Range Status   Specimen  Description BLOOD LEFT HAND  Final   Special Requests IN PEDIATRIC BOTTLE 3CC  Final   Culture NO GROWTH 5 DAYS  Final   Report Status 06/10/2016 FINAL  Final  Culture, body fluid-bottle     Status: None   Collection Time: 06/06/16  2:43 PM  Result Value Ref Range Status   Specimen Description PLEURAL LEFT  Final   Special Requests AEROBIC BOTTLE ONLY 6CC  Final   Culture NO GROWTH 5 DAYS  Final   Report Status 06/11/2016 FINAL  Final  Gram stain     Status: None   Collection Time: 06/06/16  2:43 PM  Result Value Ref Range Status   Specimen Description PLEURAL LEFT  Final   Special Requests NONE  Final   Gram Stain   Final    ABUNDANT WBC PRESENT,BOTH PMN AND MONONUCLEAR NO ORGANISMS SEEN    Report Status 06/06/2016 FINAL  Final      Radiology Studies: Ct Abdomen Pelvis Wo Contrast  06/14/2016  CLINICAL DATA:  Restaging metastatic right lung cancer. Malignant cells on left-sided thoracentesis performed 06/06/2016. History of diabetes, hypertension and congestive heart failure. EXAM: CT CHEST, ABDOMEN AND PELVIS WITHOUT CONTRAST TECHNIQUE: Multidetector CT imaging of the chest, abdomen and pelvis was performed following the standard protocol without IV contrast. COMPARISON:  Chest CT 05/16/2016, PET-CT 09/27/2015 and abdominal CT 09/23/2006. FINDINGS: CT CHEST Mediastinum/Nodes: Compared with the prior examinations, there are multiple enlarging mediastinal and left hilar lymph nodes. There is a stable small calcified right hilar node. 19 mm AP window node on image 25 is not significantly changed. 2.9 cm Lynze Reddy axis subcarinal node on image 37 previously measured 1.9 cm. There is an ill-defined left perihilar mass, constricting the central left bronchi. This mass is not actively measured due to the lack of contrast and collapse of the left lower lobe. There is some retained contrast within the esophagus. The heart size is normal. There is no pericardial effusion. There is extensive  atherosclerosis of the aorta, great vessels and coronary arteries. Lungs/Pleura: Left pleural effusion has mildly enlarged compared with the prior chest CT. There is a component loculated within the superior aspect of the fissure and at the apex. No significant pleural fluid on the right. Again demonstrated is extensive centrilobular emphysema. No focally suspicious findings are seen within the right  lung. As above, there is evidence of a central left perihilar mass with complete collapse of the left lower lobe. There is new dependent high-density material within the left lung, suspicious for aspiration. There are coarsened interstitial and airspace opacities extending from the left hilum, likely in part secondary to postobstructive pneumonitis. Musculoskeletal/Chest wall: No chest wall mass or suspicious osseous findings. CT ABDOMEN AND PELVIS FINDINGS Hepatobiliary: 8 mm probable cyst inferiorly in the right hepatic lobe on image 70 and 12 mm lesion in the left lobe on image 57 are unchanged from prior PET-CT. As evaluated in the noncontrast state, the liver demonstrates no suspicious findings. There is some layering sludge or small stones in the gallbladder. No gallbladder wall thickening or biliary dilatation. Pancreas: Unremarkable. No pancreatic ductal dilatation or surrounding inflammatory changes. Spleen: Normal in size without focal abnormality. Adrenals/Urinary Tract: Both adrenal glands appear normal. Both kidneys demonstrate cortical thinning and lobularity. No evidence renal mass on noncontrast imaging. There is no evidence of urinary tract calculus or hydronephrosis. The bladder is mildly distended without apparent focal abnormality. Stomach/Bowel: No evidence of bowel wall thickening, distention or surrounding inflammatory change. There is prominent stool in the rectum. The appendix appears normal. Vascular/Lymphatic: 11 mm celiac node on image 61 has mildly enlarged. No other enlarged abdominal  pelvic lymph nodes are identified. There is extensive aortic and branch vessel atherosclerosis with an eccentric aneurysm of the mid abdominal aorta, measuring up to 5.4 x 3.9 cm transverse on image 76. This is similar to previous PET-CT. Reproductive: The prostate gland and seminal vesicles appear unremarkable. Penile prosthesis noted. Other: No evidence of abdominal wall hernia or mass. There is no ascites or peritoneal nodularity. Musculoskeletal: No acute or significant osseous findings. Mild degenerative changes in the spine and sacroiliac joints. IMPRESSION: 1. Interval development of a large ill-defined left perihilar mass with resulting subtotal collapse of the left lung, progressive mediastinal lymphadenopathy and a complex left pleural effusion, consistent with locally advanced left lung cancer. Consider PET-CT for further staging. 2. No suspicious right lung findings. There is diffuse emphysema and suspicion of aspiration into the left lung. 3. Interval enlargement of a single celiac node. No other evidence of metastatic disease within the abdomen or pelvis. 4. Extensive atherosclerosis with grossly stable with eccentric abdominal aortic aneurysm. Electronically Signed   By: Richardean Sale M.D.   On: 06/14/2016 11:13   Ct Chest Wo Contrast  06/14/2016  CLINICAL DATA:  Restaging metastatic right lung cancer. Malignant cells on left-sided thoracentesis performed 06/06/2016. History of diabetes, hypertension and congestive heart failure. EXAM: CT CHEST, ABDOMEN AND PELVIS WITHOUT CONTRAST TECHNIQUE: Multidetector CT imaging of the chest, abdomen and pelvis was performed following the standard protocol without IV contrast. COMPARISON:  Chest CT 05/16/2016, PET-CT 09/27/2015 and abdominal CT 09/23/2006. FINDINGS: CT CHEST Mediastinum/Nodes: Compared with the prior examinations, there are multiple enlarging mediastinal and left hilar lymph nodes. There is a stable small calcified right hilar node. 19 mm AP  window node on image 25 is not significantly changed. 2.9 cm Harrold Fitchett axis subcarinal node on image 37 previously measured 1.9 cm. There is an ill-defined left perihilar mass, constricting the central left bronchi. This mass is not actively measured due to the lack of contrast and collapse of the left lower lobe. There is some retained contrast within the esophagus. The heart size is normal. There is no pericardial effusion. There is extensive atherosclerosis of the aorta, great vessels and coronary arteries. Lungs/Pleura: Left pleural effusion has mildly enlarged  compared with the prior chest CT. There is a component loculated within the superior aspect of the fissure and at the apex. No significant pleural fluid on the right. Again demonstrated is extensive centrilobular emphysema. No focally suspicious findings are seen within the right lung. As above, there is evidence of a central left perihilar mass with complete collapse of the left lower lobe. There is new dependent high-density material within the left lung, suspicious for aspiration. There are coarsened interstitial and airspace opacities extending from the left hilum, likely in part secondary to postobstructive pneumonitis. Musculoskeletal/Chest wall: No chest wall mass or suspicious osseous findings. CT ABDOMEN AND PELVIS FINDINGS Hepatobiliary: 8 mm probable cyst inferiorly in the right hepatic lobe on image 70 and 12 mm lesion in the left lobe on image 57 are unchanged from prior PET-CT. As evaluated in the noncontrast state, the liver demonstrates no suspicious findings. There is some layering sludge or small stones in the gallbladder. No gallbladder wall thickening or biliary dilatation. Pancreas: Unremarkable. No pancreatic ductal dilatation or surrounding inflammatory changes. Spleen: Normal in size without focal abnormality. Adrenals/Urinary Tract: Both adrenal glands appear normal. Both kidneys demonstrate cortical thinning and lobularity. No  evidence renal mass on noncontrast imaging. There is no evidence of urinary tract calculus or hydronephrosis. The bladder is mildly distended without apparent focal abnormality. Stomach/Bowel: No evidence of bowel wall thickening, distention or surrounding inflammatory change. There is prominent stool in the rectum. The appendix appears normal. Vascular/Lymphatic: 11 mm celiac node on image 61 has mildly enlarged. No other enlarged abdominal pelvic lymph nodes are identified. There is extensive aortic and branch vessel atherosclerosis with an eccentric aneurysm of the mid abdominal aorta, measuring up to 5.4 x 3.9 cm transverse on image 76. This is similar to previous PET-CT. Reproductive: The prostate gland and seminal vesicles appear unremarkable. Penile prosthesis noted. Other: No evidence of abdominal wall hernia or mass. There is no ascites or peritoneal nodularity. Musculoskeletal: No acute or significant osseous findings. Mild degenerative changes in the spine and sacroiliac joints. IMPRESSION: 1. Interval development of a large ill-defined left perihilar mass with resulting subtotal collapse of the left lung, progressive mediastinal lymphadenopathy and a complex left pleural effusion, consistent with locally advanced left lung cancer. Consider PET-CT for further staging. 2. No suspicious right lung findings. There is diffuse emphysema and suspicion of aspiration into the left lung. 3. Interval enlargement of a single celiac node. No other evidence of metastatic disease within the abdomen or pelvis. 4. Extensive atherosclerosis with grossly stable with eccentric abdominal aortic aneurysm. Electronically Signed   By: Richardean Sale M.D.   On: 06/14/2016 11:13   Nm Bone Scan Whole Body  06/14/2016  CLINICAL DATA:  Metastatic lung cancer, shortness of breath, pneumonia EXAM: NUCLEAR MEDICINE WHOLE BODY BONE SCAN TECHNIQUE: Whole body anterior and posterior images were obtained approximately 3 hours after  intravenous injection of radiopharmaceutical. RADIOPHARMACEUTICALS:  55.5 mCi Technetium-5mMDP IV COMPARISON:  None; correlation CT chest abdomen pelvis 06/15/2016, PET-CT 09/27/2015 the FINDINGS: Tracer within a distended urinary bladder obscures portions the pelvis. Uptake at anterior RIGHT sixth seventh eighth and questionably ninth ribs, in a linear orientation, likely representing sequela of trauma ; patient has multiple healing fractures of lower anterior RIGHT ribs on CT. Minimal uptake at the shoulders, LEFT elbow, wrists, and toes typically degenerative. No additional sites of abnormal osseous tracer accumulation identified which are suspicious for osseous metastatic disease. Expected urinary tract and soft tissue distribution of tracer. IMPRESSION: No definite  scintigraphic evidence of osseous metastatic disease. Uptake in multiple adjacent anterior lower RIGHT ribs compatible with prior trauma/fractures. Electronically Signed   By: Lavonia Dana M.D.   On: 06/14/2016 14:58     Scheduled Meds: . antiseptic oral rinse  7 mL Mouth Rinse q12n4p  . aspirin EC  81 mg Oral QHS  . budesonide (PULMICORT) nebulizer solution  0.5 mg Nebulization BID  . carvedilol  3.125 mg Oral BID WC  . chlorhexidine  15 mL Mouth Rinse BID  . clopidogrel  75 mg Oral Daily  . diatrizoate meglumine-sodium      . insulin aspart  0-9 Units Subcutaneous TID WC & HS  . ipratropium  0.5 mg Nebulization TID  . isosorbide dinitrate  10 mg Oral BID  . levalbuterol  0.63 mg Nebulization TID  . ranolazine  500 mg Oral BID  . sodium chloride flush  3 mL Intravenous Q12H  . sodium chloride flush  3 mL Intravenous Q12H   Continuous Infusions:    LOS: 9 days    Time spent: 30 min    Alan Canterbury, MD [facility] Pager [phone]  If 7PM-7AM, please contact night-coverage [email or web address] Password [facility] 06/14/2016, 3:17 PM

## 2016-06-14 NOTE — Progress Notes (Signed)
PT Cancellation Note  Patient Details Name: Alan Ford MRN: [id number] DOB: [birth year]   Cancelled Treatment:    Reason Eval/Treat Not Completed: Patient declined, no reason specified.  "I've had a busy day, I'd like to wait until tomorrow." 06/14/2016  Donnella Sham, PT [phone]  (pager)   Demi Trieu, Tessie Fass 06/14/2016, 5:02 PM

## 2016-06-14 NOTE — Consult Note (Addendum)
Radiation Oncology         [phone] ________________________________  Name: Alan Ford MRN: [id number]  Date: 06/14/16  DOB: [birth year]  NT:[id number],HQP Marcello Moores, MD  No ref. provider found     REFERRING PHYSICIAN: No ref. provider found   DIAGNOSIS: The primary encounter diagnosis was Pleural effusion on left. Diagnoses of Acute on chronic renal failure (HCC), Acute on chronic respiratory failure with hypoxia (HCC), Pleural effusion, S/P thoracentesis, Shortness of breath, SOB (shortness of breath), Metastatic lung carcinoma, right ([place]), Metastatic lung carcinoma, right (HCC), Metastatic lung carcinoma, right (HCC), Metastatic lung carcinoma, right ([place]), and Metastatic lung carcinoma, right ([place]) were also pertinent to this visit.   HISTORY OF PRESENT ILLNESS: Alan Ford is a 82 y.o. male seen at the request of Dr. [name] with a newly diagnosed advanced lung cancer. The patient apparently has a history of coronary artery disease, renal insufficiency, diabetes, hypertension, congestive heart failure, and COPD. 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 During a workup for shortness of breath, a CT scan revealed a 4 cm lobulated/spiculated opacity in the lingula with adjacent chest wall pleural thickening, and no evidence of effusion. There is questionable change along the prevascular lymph nodes, and stability of previously noted nodes in the right paratracheal and right precarinal-right hilar region. The patient developed symptoms of angina and was found to have an acute MI in May 2017. He was taken to the Cath Lab on 05/17/16 and underwent PCI with rotational arthrectomy, and stent placement of the RCA.  He presented again to the hospital on 06/05/2014 with increasing shortness of breath. At that time chest x-ray were performed and revealed a left pleural effusion were noted. He ultimately underwent a left thoracentesis on 06/06/16 and cytology from 1.5 L of fluid revealed  poorly differentiated malignant cells. He has undergone CT of the chest, abdomen and pelvis for re-staging and these reveal persistent mass int he left perihilar region with subtotal collapse of the left lung, progressive mediastinal adenopathy and complex left effusion. He did not have other evidence of metastatic disease. He has plans to undergo pleurex catheter placement, and PAC in anticipation of systemic therapy with Dr. [name]. We are asked to see the patient for consideration of palliative radiotherapy in hopes of opening up the left lower lobe.    PREVIOUS RADIATION THERAPY: No   PAST MEDICAL HISTORY:  Past Medical History  Diagnosis Date  . Diabetes mellitus   . High blood pressure   . High cholesterol   . CHF (congestive heart failure) ([place])   . Emphysema   . Joint pain   . AAA (abdominal aortic aneurysm) ([place])   . COPD (chronic obstructive pulmonary disease) ([place])   . Chronic kidney disease   . Irregular heart beat   . PVC (premature ventricular contraction)   . Shingles   . Chronic renal disease, stage III   . Sleep apnea     cpap  . Irregular heart beat   . CAD (coronary artery disease)   . Chronic diastolic heart failure ([place]) 07/24/2011  . Atrial fibrillation ([place])   . Aortic calcification (HCC) 05/16/2016    Involving ascending and descending thoracic aorta       PAST SURGICAL HISTORY: Past Surgical History  Procedure Laterality Date  . Penile prosthesis implant  09-2005  . Tonsillectomy    . Urethrotomy    . Cardiac catheterization N/A 05/15/2016    Procedure: Right/Left Heart Cath and Coronary Angiography;  Surgeon: Belva Crome, MD;  Location: [facility];  Service: Cardiovascular;  Laterality: N/A;  . Cardiac catheterization N/A 05/17/2016    Procedure: Coronary/Graft Atherectomy;  Surgeon: Belva Crome, MD;  Location: [facility];  Service: Cardiovascular;  Laterality: N/A;     FAMILY HISTORY:  Family History  Problem Relation Age of  Onset  . Other Father     AAA  . Heart disease Father     After age 38,   AAA  . Hypertension Father   . Hyperlipidemia Father   . Heart attack Father   . Diabetes Mother     amputation  . Heart disease Mother   . Hypertension Mother   . Hyperlipidemia Mother   . Deep vein thrombosis Mother   . Heart attack Mother   . Diabetes Sister   . Heart disease Sister     Heart Disease before age 34  . Hypertension Sister   . Hyperlipidemia Sister   . Diabetes Brother   . Heart disease Brother   . Hypertension Brother   . Hyperlipidemia Brother   . Cancer Daughter      SOCIAL HISTORY:  reports that he quit smoking about 30 years ago. His smoking use included Cigarettes. He has a 90 pack-year smoking history. He has quit using smokeless tobacco. His smokeless tobacco use included Chew. He reports that he does not drink alcohol or use illicit drugs. The patient resides in [place]. He is a retired Transport planner and is married to his second wife. His first wife passed away from pancreatic cancer and was cared for by Dr. [name].   ALLERGIES: Review of patient's allergies indicates no active allergies.   MEDICATIONS:  Current Facility-Administered Medications  Medication Dose Route Frequency Provider Last Rate Last Dose  . 0.9 %  sodium chloride infusion  250 mL Intravenous PRN Phillips Grout, MD      . acetaminophen (TYLENOL) tablet 650 mg  650 mg Oral Q6H PRN Gardiner Barefoot, NP   650 mg at 06/10/16 2221  . antiseptic oral rinse (CPC / CETYLPYRIDINIUM CHLORIDE 0.05%) solution 7 mL  7 mL Mouth Rinse q12n4p Phillips Grout, MD   7 mL at 06/13/16 1810  . aspirin EC tablet 81 mg  81 mg Oral QHS Phillips Grout, MD   81 mg at 06/13/16 2204  . budesonide (PULMICORT) nebulizer solution 0.5 mg  0.5 mg Nebulization BID Velvet Bathe, MD   0.5 mg at 06/13/16 2049  . carvedilol (COREG) tablet 3.125 mg  3.125 mg Oral BID WC Phillips Grout, MD   3.125 mg at 06/14/16 0903  . chlorhexidine (PERIDEX)  0.12 % solution 15 mL  15 mL Mouth Rinse BID Phillips Grout, MD   15 mL at 06/14/16 0903  . clopidogrel (PLAVIX) tablet 75 mg  75 mg Oral Daily Janece Canterbury, MD   75 mg at 06/14/16 1030  . diatrizoate meglumine-sodium (GASTROGRAFIN) 66-10 % solution           . HYDROcodone-acetaminophen (NORCO/VICODIN) 5-325 MG per tablet 1 tablet  1 tablet Oral Daily PRN Collene Gobble, MD   1 tablet at 06/14/16 0304  . insulin aspart (novoLOG) injection 0-9 Units  0-9 Units Subcutaneous TID WC & HS Velvet Bathe, MD   2 Units at 06/14/16 0701  . ipratropium (ATROVENT) nebulizer solution 0.5 mg  0.5 mg Nebulization TID Velvet Bathe, MD   0.5 mg at 06/13/16 2049  . isosorbide dinitrate (ISORDIL)  tablet 10 mg  10 mg Oral BID Phillips Grout, MD   10 mg at 06/14/16 3086  . levalbuterol (XOPENEX) nebulizer solution 0.63 mg  0.63 mg Nebulization Q3H PRN Corey Harold, NP   0.63 mg at 06/08/16 1938  . levalbuterol (XOPENEX) nebulizer solution 0.63 mg  0.63 mg Nebulization TID Velvet Bathe, MD   0.63 mg at 06/13/16 2049  . morphine CONCENTRATE 10 MG/0.5ML oral solution 5 mg  5 mg Oral Q3H PRN Knox Royalty, NP   5 mg at 06/13/16 2204  . ranolazine (RANEXA) 12 hr tablet 500 mg  500 mg Oral BID Phillips Grout, MD   500 mg at 06/14/16 0903  . sodium chloride flush (NS) 0.9 % injection 3 mL  3 mL Intravenous Q12H Phillips Grout, MD   3 mL at 06/13/16 1218  . sodium chloride flush (NS) 0.9 % injection 3 mL  3 mL Intravenous Q12H Phillips Grout, MD   3 mL at 06/14/16 0903  . sodium chloride flush (NS) 0.9 % injection 3 mL  3 mL Intravenous PRN Phillips Grout, MD      . zolpidem (AMBIEN) tablet 5 mg  5 mg Oral QHS PRN Ritta Slot, NP   5 mg at 06/13/16 2205     REVIEW OF SYSTEMS: On review of systems, the patient reports that since his hospitalization, his breathing has improved. He is currently on 5 L O2 via [place]. He denies any chest pain, shortness of breath, hemoptysis, fevers, chills, night sweats. He has lost between  15-20 pounds unintentionally over the past few months. He denies any bowel or bladder disturbances, and denies abdominal pain, nausea or vomiting. He denies any new musculoskeletal or joint aches or pains, headaches, blurred or double vision, or auditory disturbances. A complete review of systems is obtained and is otherwise negative.   PHYSICAL EXAM:  height is 5' 10.5" (1.791 m) and weight is 166 lb 3.2 oz (75.388 kg). His oral temperature is 98 F (36.7 C). His blood pressure is 145/72 and his pulse is 107. His respiration is 20 and oxygen saturation is 92%.   Pain scale 0/10 In general this is a somewhat tired appearing Caucasian male who is in no acute distress and appears younger than his stated age. He is alert and oriented x4 and appropriate throughout the examination. HEENT reveals that the patient is normocephalic, atraumatic. EOMs are intact.  Skin is intact without any evidence of gross lesions. Cardiovascular exam reveals a regular rate and rhythm, no clicks rubs or murmurs are auscultated. Chest is clear to auscultation on the right, and in the upper field on the left, there are decreased breath sounds on the left base. Lymphatic assessment is performed and does not reveal any adenopathy in the cervical, supraclavicular, axillary, or inguinal chains. Abdomen has active bowel sounds in all quadrants and is intact. The abdomen is soft, non tender, non distended. Lower extremities are negative for pretibial pitting edema, deep calf tenderness, cyanosis or clubbing.   ECOG = 2  0 - Asymptomatic (Fully active, able to carry on all predisease activities without restriction)  1 - Symptomatic but completely ambulatory (Restricted in physically strenuous activity but ambulatory and able to carry out work of a light or sedentary nature. For example, light housework, office work)  2 - Symptomatic, <50% in bed during the day (Ambulatory and capable of all self care but unable to carry out any work  activities. Up and about  more than 50% of waking hours)  3 - Symptomatic, >50% in bed, but not bedbound (Capable of only limited self-care, confined to bed or chair 50% or more of waking hours)  4 - Bedbound (Completely disabled. Cannot carry on any self-care. Totally confined to bed or chair)  5 - Death   Eustace Pen MM, Creech RH, Tormey DC, et al. [phone]). "Toxicity and response criteria of the [facility] Group". [place] Oncol. 5 (6): 649-55    LABORATORY DATA:  Lab Results  Component Value Date   WBC 13.9* 06/12/2016   HGB 8.8* 06/12/2016   HCT 27.6* 06/12/2016   MCV 93.6 06/12/2016   PLT 307 06/12/2016   Lab Results  Component Value Date   NA 132* 06/14/2016   K 5.0 06/14/2016   CL 96* 06/14/2016   CO2 21* 06/14/2016   Lab Results  Component Value Date   ALT 33 06/14/2016   AST 40 06/14/2016   ALKPHOS 79 06/14/2016   BILITOT 0.4 06/14/2016      RADIOGRAPHY: Ct Abdomen Pelvis Wo Contrast  06/14/2016  CLINICAL DATA:  Restaging metastatic right lung cancer. Malignant cells on left-sided thoracentesis performed 06/06/2016. History of diabetes, hypertension and congestive heart failure. EXAM: CT CHEST, ABDOMEN AND PELVIS WITHOUT CONTRAST TECHNIQUE: Multidetector CT imaging of the chest, abdomen and pelvis was performed following the standard protocol without IV contrast. COMPARISON:  Chest CT 05/16/2016, PET-CT 09/27/2015 and abdominal CT 09/23/2006. FINDINGS: CT CHEST Mediastinum/Nodes: Compared with the prior examinations, there are multiple enlarging mediastinal and left hilar lymph nodes. There is a stable small calcified right hilar node. 19 mm AP window node on image 25 is not significantly changed. 2.9 cm short axis subcarinal node on image 37 previously measured 1.9 cm. There is an ill-defined left perihilar mass, constricting the central left bronchi. This mass is not actively measured due to the lack of contrast and collapse of the left lower lobe. There  is some retained contrast within the esophagus. The heart size is normal. There is no pericardial effusion. There is extensive atherosclerosis of the aorta, great vessels and coronary arteries. Lungs/Pleura: Left pleural effusion has mildly enlarged compared with the prior chest CT. There is a component loculated within the superior aspect of the fissure and at the apex. No significant pleural fluid on the right. Again demonstrated is extensive centrilobular emphysema. No focally suspicious findings are seen within the right lung. As above, there is evidence of a central left perihilar mass with complete collapse of the left lower lobe. There is new dependent high-density material within the left lung, suspicious for aspiration. There are coarsened interstitial and airspace opacities extending from the left hilum, likely in part secondary to postobstructive pneumonitis. Musculoskeletal/Chest wall: No chest wall mass or suspicious osseous findings. CT ABDOMEN AND PELVIS FINDINGS Hepatobiliary: 8 mm probable cyst inferiorly in the right hepatic lobe on image 70 and 12 mm lesion in the left lobe on image 57 are unchanged from prior PET-CT. As evaluated in the noncontrast state, the liver demonstrates no suspicious findings. There is some layering sludge or small stones in the gallbladder. No gallbladder wall thickening or biliary dilatation. Pancreas: Unremarkable. No pancreatic ductal dilatation or surrounding inflammatory changes. Spleen: Normal in size without focal abnormality. Adrenals/Urinary Tract: Both adrenal glands appear normal. Both kidneys demonstrate cortical thinning and lobularity. No evidence renal mass on noncontrast imaging. There is no evidence of urinary tract calculus or hydronephrosis. The bladder is mildly distended without apparent focal abnormality. Stomach/Bowel:  No evidence of bowel wall thickening, distention or surrounding inflammatory change. There is prominent stool in the rectum. The  appendix appears normal. Vascular/Lymphatic: 11 mm celiac node on image 61 has mildly enlarged. No other enlarged abdominal pelvic lymph nodes are identified. There is extensive aortic and branch vessel atherosclerosis with an eccentric aneurysm of the mid abdominal aorta, measuring up to 5.4 x 3.9 cm transverse on image 76. This is similar to previous PET-CT. Reproductive: The prostate gland and seminal vesicles appear unremarkable. Penile prosthesis noted. Other: No evidence of abdominal wall hernia or mass. There is no ascites or peritoneal nodularity. Musculoskeletal: No acute or significant osseous findings. Mild degenerative changes in the spine and sacroiliac joints. IMPRESSION: 1. Interval development of a large ill-defined left perihilar mass with resulting subtotal collapse of the left lung, progressive mediastinal lymphadenopathy and a complex left pleural effusion, consistent with locally advanced left lung cancer. Consider PET-CT for further staging. 2. No suspicious right lung findings. There is diffuse emphysema and suspicion of aspiration into the left lung. 3. Interval enlargement of a single celiac node. No other evidence of metastatic disease within the abdomen or pelvis. 4. Extensive atherosclerosis with grossly stable with eccentric abdominal aortic aneurysm. Electronically Signed   By: Richardean Sale M.D.   On: 06/14/2016 11:13   Dg Chest 2 View  06/21/2016  CLINICAL DATA:  Shortness of breath for several weeks. EXAM: CHEST  2 VIEW COMPARISON:  05/21/2016 FINDINGS: Cardiomediastinal silhouette is normal. Mediastinal contours appear intact. Aortic atherosclerosis is seen. There is no evidence of pneumothorax. There is an enlarging left pleural effusion. Left lung base atelectasis or airspace consolidation cannot be excluded. Osseous structures are without acute abnormality. Soft tissues are grossly normal. IMPRESSION: Enlarging left pleural effusion, with probable associated atelectasis or  airspace consolidation the left lung base. Electronically Signed   By: Fidela Salisbury M.D.   On: 06/06/2016 20:54   Dg Chest 2 View  05/21/2016  CLINICAL DATA:  Shortness of breath with pleural effusion EXAM: CHEST  2 VIEW COMPARISON:  05/19/2016 FINDINGS: Mild cardiac enlargement with aortic calcifications stable. Vascular pattern normal. Mild background diffuse interstitial prominence stable. Right lung otherwise clear. Consolidation in the left lower lobe with small effusion again identified. IMPRESSION: Stable left lower lobe consolidation and associated pleural effusion. Known left lung mass seen on 05/16/2016 CT scan not readily appreciated on current radiograph. Electronically Signed   By: Skipper Cliche M.D.   On: 05/21/2016 07:42   Dg Chest 2 View  05/19/2016  CLINICAL DATA:  Dyspnea EXAM: CHEST  2 VIEW COMPARISON:  05/14/2016 chest radiograph. FINDINGS: Stable cardiomediastinal silhouette with top-normal heart size. No pneumothorax. Small to moderate left pleural effusion, increased. No right pleural effusion. Patchy left lung base consolidation, increased. No pulmonary edema. Mildly hyperinflated lungs. Hazy peripheral reticular opacities throughout both lungs, unchanged. IMPRESSION: 1. Worsening patchy left lung base consolidation, nonspecific, suspicious for worsening pneumonia superimposed on lingular mass as suggested on 05/16/2016 chest CT. 2. Small to moderate left pleural effusion, increased. 3. Findings are superimposed on hazy peripheral reticular lung opacities, which may indicate underlying interstitial lung disease. 4. Hyperinflated lungs, suggesting COPD. Electronically Signed   By: Ilona Sorrel M.D.   On: 05/19/2016 12:38   Ct Chest Wo Contrast  06/14/2016  CLINICAL DATA:  Restaging metastatic right lung cancer. Malignant cells on left-sided thoracentesis performed 06/06/2016. History of diabetes, hypertension and congestive heart failure. EXAM: CT CHEST, ABDOMEN AND PELVIS  WITHOUT CONTRAST TECHNIQUE: Multidetector CT imaging  of the chest, abdomen and pelvis was performed following the standard protocol without IV contrast. COMPARISON:  Chest CT 05/16/2016, PET-CT 09/27/2015 and abdominal CT 09/23/2006. FINDINGS: CT CHEST Mediastinum/Nodes: Compared with the prior examinations, there are multiple enlarging mediastinal and left hilar lymph nodes. There is a stable small calcified right hilar node. 19 mm AP window node on image 25 is not significantly changed. 2.9 cm short axis subcarinal node on image 37 previously measured 1.9 cm. There is an ill-defined left perihilar mass, constricting the central left bronchi. This mass is not actively measured due to the lack of contrast and collapse of the left lower lobe. There is some retained contrast within the esophagus. The heart size is normal. There is no pericardial effusion. There is extensive atherosclerosis of the aorta, great vessels and coronary arteries. Lungs/Pleura: Left pleural effusion has mildly enlarged compared with the prior chest CT. There is a component loculated within the superior aspect of the fissure and at the apex. No significant pleural fluid on the right. Again demonstrated is extensive centrilobular emphysema. No focally suspicious findings are seen within the right lung. As above, there is evidence of a central left perihilar mass with complete collapse of the left lower lobe. There is new dependent high-density material within the left lung, suspicious for aspiration. There are coarsened interstitial and airspace opacities extending from the left hilum, likely in part secondary to postobstructive pneumonitis. Musculoskeletal/Chest wall: No chest wall mass or suspicious osseous findings. CT ABDOMEN AND PELVIS FINDINGS Hepatobiliary: 8 mm probable cyst inferiorly in the right hepatic lobe on image 70 and 12 mm lesion in the left lobe on image 57 are unchanged from prior PET-CT. As evaluated in the noncontrast  state, the liver demonstrates no suspicious findings. There is some layering sludge or small stones in the gallbladder. No gallbladder wall thickening or biliary dilatation. Pancreas: Unremarkable. No pancreatic ductal dilatation or surrounding inflammatory changes. Spleen: Normal in size without focal abnormality. Adrenals/Urinary Tract: Both adrenal glands appear normal. Both kidneys demonstrate cortical thinning and lobularity. No evidence renal mass on noncontrast imaging. There is no evidence of urinary tract calculus or hydronephrosis. The bladder is mildly distended without apparent focal abnormality. Stomach/Bowel: No evidence of bowel wall thickening, distention or surrounding inflammatory change. There is prominent stool in the rectum. The appendix appears normal. Vascular/Lymphatic: 11 mm celiac node on image 61 has mildly enlarged. No other enlarged abdominal pelvic lymph nodes are identified. There is extensive aortic and branch vessel atherosclerosis with an eccentric aneurysm of the mid abdominal aorta, measuring up to 5.4 x 3.9 cm transverse on image 76. This is similar to previous PET-CT. Reproductive: The prostate gland and seminal vesicles appear unremarkable. Penile prosthesis noted. Other: No evidence of abdominal wall hernia or mass. There is no ascites or peritoneal nodularity. Musculoskeletal: No acute or significant osseous findings. Mild degenerative changes in the spine and sacroiliac joints. IMPRESSION: 1. Interval development of a large ill-defined left perihilar mass with resulting subtotal collapse of the left lung, progressive mediastinal lymphadenopathy and a complex left pleural effusion, consistent with locally advanced left lung cancer. Consider PET-CT for further staging. 2. No suspicious right lung findings. There is diffuse emphysema and suspicion of aspiration into the left lung. 3. Interval enlargement of a single celiac node. No other evidence of metastatic disease within  the abdomen or pelvis. 4. Extensive atherosclerosis with grossly stable with eccentric abdominal aortic aneurysm. Electronically Signed   By: Richardean Sale M.D.   On: 06/14/2016 11:13  Ct Chest Wo Contrast  05/16/2016  ADDENDUM REPORT: 05/16/2016 09:33 ADDENDUM: Study discussed by telephone with Dr. Doyle Askew on 05/16/2016 at 0926 hours. We discussed follow-up CT chest with IV contrast, and failing that - bronchoscopy. Electronically Signed   By: Genevie Ann M.D.   On: 05/16/2016 09:33  05/16/2016  CLINICAL DATA:  82 year old male with severe coronary artery disease, left ventricular systolic dysfunction with several week progressive symptoms of chest pain, shortness of Breath and combined systolic/diastolic congestive heart failure. Initial encounter. EXAM: CT CHEST WITHOUT CONTRAST TECHNIQUE: Multidetector CT imaging of the chest was performed following the standard protocol without IV contrast. COMPARISON:  Chest radiographs 05/14/2016 and earlier. High-resolution Chest CT without contrast 06/08/2015. PET-CT 09/27/2015 FINDINGS: Chronic centrilobular emphysema with chronic bibasilar fibrotic changes. Superimposed moderate size layering left pleural effusion (layering to 2-2.5 cm from the lung base to the apex). Superimposed compressive atelectasis along the effusion. However, there is also increased left hilar soft tissue density which is exerting mass effect on the left lingula and lower lobe bronchi (series 3, image 93). This appears to be new since June 2016 and new or increased since September 2016. On soft tissue windows through this area some of the density appears to be hilar vasculature, however, there is definite significant increased in left prevascular and AP window lymph nodes now measuring 15-20 mm short axis individually (9 mm or less in June 2016). There is a superimposed 4 cm lobulated/spiculated opacity in the lingula (series 3, image 118) which is new from prior studies. There is associated  adjacent left chest wall pleural thickening (series 2, image 124). Elsewhere there is increased curvilinear opacity along the right major fissure which most resembles atelectasis. There is chronic right apical scarring which is stable. Chronic right middle lobe calcified granuloma. No pericardial effusion. No right pleural effusion. Right peritracheal, right precarinal, and right hilar lymph nodes appear stable since 2016 (including occasional small right hilar calcified nodes. There is severe calcified atherosclerosis of the thoracic aorta. Severe coronary artery calcified plaque also noted. Negative thoracic inlet. No axillary lymphadenopathy. Stable visualized upper abdominal viscera including both adrenal glands. Chronic posterior left third through seventh rib fractures. No acute or suspicious osseous lesion identified. IMPRESSION: 1. Findings on this noncontrast exam which are highly suspicious of left lingula malignancy with malignant left hilar and prevascular/AP window lymphadenopathy. A follow-up IV contrast enhanced CT chest would be most valuable at this point for further evaluation. 2. Moderate layering left pleural effusion. 3. Underlying chronic lung disease including emphysema and basilar pulmonary fibrosis. 4. Widespread/severe calcified atherosclerosis of the thoracic aorta and coronary arteries. Electronically Signed: By: Genevie Ann M.D. On: 05/16/2016 08:31   Dg Chest Port 1 View  06/10/2016  CLINICAL DATA:  Shortness of Breath EXAM: PORTABLE CHEST 1 VIEW COMPARISON:  06/06/2016 FINDINGS: Cardiomediastinal silhouette is stable. There is moderate left pleural effusion increased from prior exam with left basilar atelectasis or infiltrate. Central mild vascular congestion without convincing pulmonary edema. Atherosclerotic calcifications of thoracic aorta again noted. IMPRESSION: Moderate left pleural effusion increased from prior exam with left basilar atelectasis or infiltrate. Central mild  vascular congestion without convincing pulmonary edema. Electronically Signed   By: Lahoma Crocker M.D.   On: 06/10/2016 09:49   Dg Chest Port 1 View  06/06/2016  CLINICAL DATA:  82 year old male status post left-sided thoracentesis. EXAM: PORTABLE CHEST 1 VIEW COMPARISON:  Chest x-ray 06/06/2016. FINDINGS: Incomplete visualization of the lower left hemithorax. Despite this limitation, the moderate left-sided pleural  effusion appears slightly decreased in size compared to the prior examination. No appreciable pneumothorax. There continues to be atelectasis and/or consolidation left lung base. Right lung appears relatively clear, although there is widespread peribronchial cuffing throughout the lungs bilaterally. No cephalization of the pulmonary vasculature. The cardiac silhouette is partially obscured, but appears borderline to mildly enlarged. The patient is rotated to the right on today's exam, resulting in distortion of the mediastinal contours and reduced diagnostic sensitivity and specificity for mediastinal pathology. Atherosclerosis in the thoracic aorta. IMPRESSION: 1. Decreased size of moderate left pleural effusion following thoracentesis. No pneumothorax. 2. Persistent atelectasis and/or airspace consolidation in left lower lobe. 3. Diffuse peribronchial cuffing, concerning for an acute bronchitis. 4. Atherosclerosis. Electronically Signed   By: Vinnie Langton M.D.   On: 06/06/2016 15:49   Dg Chest Port 1 View  06/06/2016  CLINICAL DATA:  Initial evaluation for acute shortness of breath, left pleural effusion. EXAM: PORTABLE CHEST 1 VIEW COMPARISON:  Prior radiograph from 06/14/2016 as well as prior CT from 05/16/2016. FINDINGS: Cardiomegaly is grossly stable from prior. Known left hilar mass not well visualized on this exam. Similarly, previously seen a mediastinal adenopathy also not well seen. Extensive atheromatous plaque within the intrathoracic aorta. A moderate left pleural effusion is slightly  increased from prior. Associated left basilar atelectasis/ consolidation. Slightly increased pulmonary vascular congestion as compared to previous. No new focal infiltrates. No pneumothorax. Osseous structures unchanged. Remote left clavicular fracture noted. IMPRESSION: 1. Slightly interval increase in moderate layering left pleural effusion. Similar associated left basilar atelectasis and/or consolidation. Known left hilar mass not well seen on this exam. 2. Slightly worsened pulmonary vascular congestion as compared to prior radiograph from 05/31/2016. Electronically Signed   By: Jeannine Boga M.D.   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    PLAN:  The patient will discuss his case with Dr. Lisbeth Renshaw. Dr. Lisbeth Renshaw has reviewed his films, and I have outlined the rationale for radiotherapy with the patient. He is a candidate for palliative radiotherapy, and we would offer him external radiotherapy in 10 fractions over 2 weeks. We will attempt to coordinate CT simulation tomorrow at 1pm with the intention of beginning radiation bilaterally next week. The risks, benefits, short and long-term effects of radiotherapy were detailed with the patient in general when treating the chest. He is interested in proceeding.     Carola Rhine, PAC

## 2016-06-14 NOTE — Progress Notes (Signed)
Pt sleeping at time of scheduled afternoon neb tx.Tx not given. No obvious respiratory distress noted at this time. RT will administer PRN neb if requested. RT will continue to monitor.

## 2016-06-14 NOTE — Consult Note (Signed)
Referral MD  Reason for Referral: Stage IV poorly differentiated carcinoma of the left lung; chronic renal insufficiency; cardiomyopathy   Chief Complaint  Patient presents with  . Shortness of Breath  : I have lung cancer and like to be treated.  HPI: Alan Ford is a very nice 82 year old white male. I actually took care of his wife about 12 years ago. She subsequently passed on from malignancy.  He's been with multiple health issues. He has chronic renal insufficiency. He has congestive heart failure. He has atrial fibrillation. He is on Plavix. He has underlying COPD.  In having worsening shortness of breath. He's been having some chest wall discomfort.  Back in May, he had a CT scan done on the chest. This is done without contrast because of his renal insufficiency. The CT scan showed a left pleural effusion. He had left hilar density with mass effect on the left lingula and left lower lobe. This was new. He has increased left re-vascular and AP nodes. He is noted to have a 4 cm opacity in the lingula.  He was recently admitted because of worsening shortness of breath. He had a thoracentesis done of the left pleural effusion. The pathology report ([id number]) showed malignant cells. The pathologist felt this was poorly differentiated. No further evaluation was made.  He is scheduled to have a Pleurx catheter placed.  He's lost 25 pounds. His appetite is not that great.  He has been seen by palliative care.  He has remarried.  He probably has a 60-pack-year history of tobacco use. He stops smoking back in 1987. He has no obvious occupational exposures.  He would like to have a "chance" of living a little bit longer.  There's been no bleeding. He's had little bit of constipation.  He understands that what he has is not curable.  He has a living will and he is a DO NOT RESUSCITATE which is very appropriate.  Overall, his performance status is ECOG 2-3.    Past Medical History   Diagnosis Date  . Diabetes mellitus   . High blood pressure   . High cholesterol   . CHF (congestive heart failure) ([place])   . Emphysema   . Joint pain   . AAA (abdominal aortic aneurysm) ([place])   . COPD (chronic obstructive pulmonary disease) ([place])   . Chronic kidney disease   . Irregular heart beat   . PVC (premature ventricular contraction)   . Shingles   . Chronic renal disease, stage III   . Sleep apnea     cpap  . Irregular heart beat   . CAD (coronary artery disease)   . Chronic diastolic heart failure ([place]) 07/24/2011  . Atrial fibrillation ([place])   . Aortic calcification ([facility]) 05/16/2016    Involving ascending and descending thoracic aorta  :  Past Surgical History  Procedure Laterality Date  . Penile prosthesis implant  09-2005  . Tonsillectomy    . Urethrotomy    . Cardiac catheterization N/A 05/15/2016    Procedure: Right/Left Heart Cath and Coronary Angiography;  Surgeon: Belva Crome, MD;  Location: [facility];  Service: Cardiovascular;  Laterality: N/A;  . Cardiac catheterization N/A 05/17/2016    Procedure: Coronary/Graft Atherectomy;  Surgeon: Belva Crome, MD;  Location: [facility];  Service: Cardiovascular;  Laterality: N/A;  :   Current facility-administered medications:  .  0.9 %  sodium chloride infusion, 250 mL, Intravenous, PRN, Phillips Grout, MD .  acetaminophen (TYLENOL)  tablet 650 mg, 650 mg, Oral, Q6H PRN, Gardiner Barefoot, NP, 650 mg at 06/10/16 2221 .  antiseptic oral rinse (CPC / CETYLPYRIDINIUM CHLORIDE 0.05%) solution 7 mL, 7 mL, Mouth Rinse, q12n4p, Phillips Grout, MD, 7 mL at 06/13/16 1810 .  aspirin EC tablet 81 mg, 81 mg, Oral, QHS, Phillips Grout, MD, 81 mg at 06/13/16 2204 .  budesonide (PULMICORT) nebulizer solution 0.5 mg, 0.5 mg, Nebulization, BID, Velvet Bathe, MD, 0.5 mg at 06/13/16 2049 .  carvedilol (COREG) tablet 3.125 mg, 3.125 mg, Oral, BID WC, Phillips Grout, MD, 3.125 mg at 06/13/16 1700 .  chlorhexidine  (PERIDEX) 0.12 % solution 15 mL, 15 mL, Mouth Rinse, BID, Phillips Grout, MD, 15 mL at 06/13/16 2206 .  HYDROcodone-acetaminophen (NORCO/VICODIN) 5-325 MG per tablet 1 tablet, 1 tablet, Oral, Daily PRN, Collene Gobble, MD, 1 tablet at 06/14/16 0304 .  insulin aspart (novoLOG) injection 0-9 Units, 0-9 Units, Subcutaneous, TID WC & HS, Velvet Bathe, MD, 2 Units at 06/14/16 0701 .  ipratropium (ATROVENT) nebulizer solution 0.5 mg, 0.5 mg, Nebulization, TID, Velvet Bathe, MD, 0.5 mg at 06/13/16 2049 .  isosorbide dinitrate (ISORDIL) tablet 10 mg, 10 mg, Oral, BID, Phillips Grout, MD, 10 mg at 06/13/16 2204 .  levalbuterol (XOPENEX) nebulizer solution 0.63 mg, 0.63 mg, Nebulization, Q3H PRN, Corey Harold, NP, 0.63 mg at 06/08/16 1938 .  levalbuterol (XOPENEX) nebulizer solution 0.63 mg, 0.63 mg, Nebulization, TID, Velvet Bathe, MD, 0.63 mg at 06/13/16 2049 .  morphine CONCENTRATE 10 MG/0.5ML oral solution 5 mg, 5 mg, Oral, Q3H PRN, Knox Royalty, NP, 5 mg at 06/13/16 2204 .  ranolazine (RANEXA) 12 hr tablet 500 mg, 500 mg, Oral, BID, Phillips Grout, MD, 500 mg at 06/13/16 2204 .  sodium chloride flush (NS) 0.9 % injection 3 mL, 3 mL, Intravenous, Q12H, Phillips Grout, MD, 3 mL at 06/13/16 1218 .  sodium chloride flush (NS) 0.9 % injection 3 mL, 3 mL, Intravenous, Q12H, Phillips Grout, MD, 3 mL at 06/13/16 2206 .  sodium chloride flush (NS) 0.9 % injection 3 mL, 3 mL, Intravenous, PRN, Phillips Grout, MD .  zolpidem (AMBIEN) tablet 5 mg, 5 mg, Oral, QHS PRN, Ritta Slot, NP, 5 mg at 06/13/16 2205:  . antiseptic oral rinse  7 mL Mouth Rinse q12n4p  . aspirin EC  81 mg Oral QHS  . budesonide (PULMICORT) nebulizer solution  0.5 mg Nebulization BID  . carvedilol  3.125 mg Oral BID WC  . chlorhexidine  15 mL Mouth Rinse BID  . insulin aspart  0-9 Units Subcutaneous TID WC & HS  . ipratropium  0.5 mg Nebulization TID  . isosorbide dinitrate  10 mg Oral BID  . levalbuterol  0.63 mg Nebulization TID  .  ranolazine  500 mg Oral BID  . sodium chloride flush  3 mL Intravenous Q12H  . sodium chloride flush  3 mL Intravenous Q12H  :  No Active Allergies:  Family History  Problem Relation Age of Onset  . Other Father     AAA  . Heart disease Father     After age 53,   AAA  . Hypertension Father   . Hyperlipidemia Father   . Heart attack Father   . Diabetes Mother     amputation  . Heart disease Mother   . Hypertension Mother   . Hyperlipidemia Mother   . Deep vein thrombosis Mother   . Heart attack Mother   .  Diabetes Sister   . Heart disease Sister     Heart Disease before age 41  . Hypertension Sister   . Hyperlipidemia Sister   . Diabetes Brother   . Heart disease Brother   . Hypertension Brother   . Hyperlipidemia Brother   . Cancer Daughter   :  Social History   Social History  . Marital Status: Married    Spouse Name: N/A  . Number of Children: 2  . Years of Education: N/A   Occupational History  . mental health    Social History Main Topics  . Smoking status: Former Smoker -- 3.00 packs/day for 30 years    Types: Cigarettes    Quit date: 12/31/1985  . Smokeless tobacco: Former Systems developer    Types: Chew     Comment: "little chewing tobacco"  . Alcohol Use: No     Comment: quit in 1985  . Drug Use: No  . Sexual Activity: Not on file   Other Topics Concern  . Not on file   Social History Narrative  :  Pertinent items are noted in HPI.  Exam: Patient Vitals for the past 24 hrs:  BP Temp Temp src Pulse Resp SpO2 Weight  06/14/16 0445 (!) 145/72 mmHg 98 F (36.7 C) Oral (!) 107 20 92 % 166 lb 3.2 oz (75.388 kg)  06/13/16 2146 137/70 mmHg 98.1 F (36.7 C) Oral 100 20 92 % -  06/13/16 1200 113/78 mmHg 98.6 F (37 C) Oral 100 20 92 % -   As above    Recent Labs  06/11/16 1050 06/12/16 0045  WBC 13.5* 13.9*  HGB 9.4* 8.8*  HCT 30.2* 27.6*  PLT 326 307    Recent Labs  06/11/16 1050 06/12/16 2339  NA 131* 131*  K 5.0 5.0  CL 99* 99*  CO2  21* 21*  GLUCOSE 265* 142*  BUN 37* 48*  CREATININE 2.46* 2.38*  CALCIUM 8.5* 8.7*    Blood smear review:  None  Pathology: See above     Assessment and Plan:  Alan Ford is a 82 year old white male. He has a history of tobacco use. He has multiple health issues. His performance status is marginal at best.  Again, Alan Ford has metastatic disease. He would be considered stage IV by virtue of the pleural effusion.  I'll have to be with pathology. Maybe they can enlighten [place] as to whether this is squamous cell carcinoma or adenocarcinoma. Regardless, I probably would send off genetic driver analysis to see if he has a mutation that is targeted.  He's not had any scans for about a month. He really needs to have an updated set of scans. He's had an MRI of the brain. A bone scan needs to be done. We cannot do a PET scan while he is in the hospital.  Again, he realizes that this is an incurable disease. It can be treated but I told him that the best option would be targeted therapy at back to be the case, the success rate should be over 50%. If we have to use chemotherapy, I think success rate would be less than 20% given his underlying health issues.  If we use chemotherapy, I think single agent chemotherapy is all he would tolerate. We might be able to utilize Avastin.  His IV access is not that crate. I think that he try would benefit from a Port-A-Cath.  He is quite anemic. This is from his underlying renal insufficiency. I'm sure his  erythropoietin level is quite low. It probably benefit him to be transfused. Again, I don't think he has a great capacity to handle anemia. We may want to consider 2 units of blood.  I will send a prealbumin on him. I then this will really be informative as to what the prognosis will be.  I am glad that palliative care is already involved. I think they will help out with his pain issues.  I think once he gets the Pleurx catheter in, this will make him feel  better.  This is a very tough situation for Alan Ford. I appreciate his wish to live longer in order to be with his wife. However, I need to make sure that he does not suffer and that his quality of life is reasonable. I think his prognosis will include live based upon his nutritional level and his weight.  I spent about an hour with him. He is very nice. He remembered me when I take care of his wife.  We will follow along.  Lum Keas  Ephesians 6:9-10

## 2016-06-14 NOTE — Progress Notes (Signed)
[name], male   DOB: 06-02-1930, 82 y.o.   MRN: [id number]   Requests for Left PleurX catheter and PAC placement received in IR  Have reviewed all notes Discussed with Dr Janece Canterbury  Cardiac stent placed just 05/17/16 Cannot come off Plavix safely  Oncology now planning studies and tests for possible treatment of lung cancer  Will await decisions of treatment planning; candidacy for PleurX PAC if decides on treatment, etc...  On IR radar

## 2016-06-14 NOTE — Care Management Important Message (Signed)
Important Message  Patient Details  Name: Alan Ford MRN: [id number] Date of Birth: [birth year]   Medicare Important Message Given:  Yes    [name] 06/14/2016, 9:33 AM

## 2016-06-15 ENCOUNTER — Ambulatory Visit
Admit: 2016-06-15 | Discharge: 2016-06-15 | Disposition: A | Discharge status: Home / Self Care | Payer: Medicare Other | Attending: Radiation Oncology | Admitting: Radiation Oncology

## 2016-06-15 ENCOUNTER — Telehealth: Payer: Self-pay | Admitting: *Deleted

## 2016-06-15 DIAGNOSIS — Z9889 Other specified postprocedural states: Secondary | ICD-10-CM

## 2016-06-15 DIAGNOSIS — C3492 Malignant neoplasm of unspecified part of left bronchus or lung: Secondary | ICD-10-CM

## 2016-06-15 DIAGNOSIS — C7802 Secondary malignant neoplasm of left lung: Secondary | ICD-10-CM

## 2016-06-15 LAB — BASIC METABOLIC PANEL
Anion gap: 11 (ref 5–15)
BUN: 63 mg/dL — AB (ref 6–20)
CALCIUM: 8.8 mg/dL — AB (ref 8.9–10.3)
CHLORIDE: 97 mmol/L — AB (ref 101–111)
CO2: 22 mmol/L (ref 22–32)
CREATININE: 2.51 mg/dL — AB (ref 0.61–1.24)
GFR calc non Af Amer: 22 mL/min — ABNORMAL LOW (ref >60)
GFR, EST AFRICAN AMERICAN: 25 mL/min — AB (ref >60)
GLUCOSE: 116 mg/dL — AB (ref 65–99)
Potassium: 4.8 mmol/L (ref 3.5–5.1)
Sodium: 130 mmol/L — ABNORMAL LOW (ref 135–145)

## 2016-06-15 LAB — GLUCOSE, CAPILLARY
GLUCOSE-CAPILLARY: 162 mg/dL — AB (ref 65–99)
GLUCOSE-CAPILLARY: 215 mg/dL — AB (ref 65–99)
Glucose-Capillary: 144 mg/dL — ABNORMAL HIGH (ref 65–99)

## 2016-06-15 LAB — CBC
HCT: 28.9 % — ABNORMAL LOW (ref 39.0–52.0)
Hemoglobin: 9 g/dL — ABNORMAL LOW (ref 13.0–17.0)
MCH: 29.3 pg (ref 26.0–34.0)
MCHC: 31.1 g/dL (ref 30.0–36.0)
MCV: 94.1 fL (ref 78.0–100.0)
Platelets: 369 10*3/uL (ref 150–400)
RBC: 3.07 MIL/uL — AB (ref 4.22–5.81)
RDW: 14.4 % (ref 11.5–15.5)
WBC: 15.3 10*3/uL — ABNORMAL HIGH (ref 4.0–10.5)

## 2016-06-15 LAB — ERYTHROPOIETIN: Erythropoietin: 31.8 m[IU]/mL — ABNORMAL HIGH (ref 2.6–18.5)

## 2016-06-15 MED ORDER — SENNA 8.6 MG PO TABS
2.0000 | ORAL_TABLET | Freq: Every day | ORAL | Status: DC
  Administered 2016-06-15 – 2016-06-19 (×5): 17.2 mg via ORAL
  Filled 2016-06-15 (×5): qty 2

## 2016-06-15 MED ORDER — BISACODYL 10 MG RE SUPP
20.0000 mg | Freq: Once | RECTAL | Status: DC
  Filled 2016-06-15 (×2): qty 2

## 2016-06-15 MED ORDER — POLYETHYLENE GLYCOL 3350 17 G PO PACK
17.0000 g | PACK | Freq: Two times a day (BID) | ORAL | Status: DC
  Administered 2016-06-15 – 2016-06-20 (×11): 17 g via ORAL
  Filled 2016-06-15 (×11): qty 1

## 2016-06-15 MED ORDER — MORPHINE SULFATE (CONCENTRATE) 10 MG/0.5ML PO SOLN
5.0000 mg | ORAL | Status: DC | PRN
  Administered 2016-06-16 – 2016-06-20 (×11): 5 mg via ORAL
  Filled 2016-06-15 (×11): qty 0.5

## 2016-06-15 MED ORDER — SODIUM CHLORIDE 0.9 % IV SOLN
1.5000 g | Freq: Two times a day (BID) | INTRAVENOUS | Status: DC
  Administered 2016-06-15 – 2016-06-20 (×12): 1.5 g via INTRAVENOUS
  Filled 2016-06-15 (×14): qty 1.5

## 2016-06-15 NOTE — Progress Notes (Signed)
Pt refused CPAP qhs.  Education provided.  RT will continue to monitor as needed. 

## 2016-06-15 NOTE — Progress Notes (Signed)
PT Cancellation Note  Patient Details Name: ANTONIO CRESWELL MRN: [id number] DOB: 09/16/1930   Cancelled Treatment:    Reason Eval/Treat Not Completed: Other (comment) (Pt transferring to [facility] for radiation and will stay at [facility]). Will follow up on Monday.   Kindall Swaby 06/15/2016, 2:38 PM [facility] PT [phone]

## 2016-06-15 NOTE — Care Management Important Message (Signed)
Important Message  Patient Details  Name: [name] MRN: [id number] Date of Birth: Dec 27, 1930   Medicare Important Message Given:  Yes    [name] 06/15/2016, 9:21 AM

## 2016-06-15 NOTE — Progress Notes (Signed)
Spoken with Dr Sheran Fava regarding transfer .She said that she has spoken with floor manager  That there should have no  Issue to transfer the pt and stay in [facility] after radiation

## 2016-06-15 NOTE — Clinical Social Work Note (Signed)
Patient transferring from [facility] to [facility]. CSW sent email to [facility] CSW's with patient's MRN number and notified [facility] admissions coordinator of the transfer.  This CSW signing off.  Dayton Scrape, [place]

## 2016-06-15 NOTE — Progress Notes (Addendum)
PROGRESS NOTE  Alan Ford  YTK:[id number] DOB: March 04, 1930 DOA: 06/22/2016 PCP: Alan Argyle, MD  Brief Narrative:   81yo male former smoker with diabetes mellitus type 2, chronic dCHF, atrial fibrillation, CKD stage 4, OSA on CPAP, ILD (NSIP) and COPD on 3L home O2 followed by Dr. Lamonte Ford. He was recently admitted from 05/14/2016-05/22/2016 for NSTEMI and had severe three vessel disease.  He was deemed not a candidate for CABG due to his ILD and severe COPD so he had a DES placed to his RCA on 05/17/2016. He returned 6/6 with c/o progressive dyspnea, orthopnea, cough. CXR revealed an increase in his known left pleural effusion and worsening pulmonary edema. He was started on bipap and admitted to SDU where PCCM was consulted. He underwent LEFT thoracentesis with 1.5 L removed 6/7.  Fluid was exudate by LDH only (marked pleural elevation to 1217, serum 188) and cytology demonstrated undifferentiated malignant cells.  CT chest suggests a primary lung mass and stage 4 cancer given malignant pleural effusion.  Oncology has been consulted.  Due to his deconditioning and malnutrition, he is not a good candidate for chemotherapy.  He will undergo palliative XRT to the left chest and plan to discharge to SNF until his treatments are complete.  Plan for home with hospice care (or residential hospice depending on progression and patient preference) after completion of his XRT.    Assessment & Plan:   Principal Problem:   Acute on chronic respiratory failure with hypoxia (HCC) Active Problems:   Chronic diastolic heart failure (HCC)   Dyspnea   COPD (chronic obstructive pulmonary disease) (HCC)   ILD (interstitial lung disease) (HCC)   Essential hypertension   CHF (congestive heart failure) (HCC)   Coronary artery disease involving native coronary artery of native heart without angina pectoris   Lung mass   Pleural effusion on left   Acute on chronic renal failure (HCC)   Lymphadenopathy   DNR  (do not resuscitate)   Palliative care encounter   Acute on chronic respiratory failure with hypoxia ([place]) likely secondary to malignant pleural effusion, undifferentiated, likely lung primary cancer, and possible aspiration pneumonia - appreciate pulmonology and oncology assistance -  S/p LEFT thoracentesis with improvement after removal of 1.5L of fluid -  continue lasix at current dose -  If he appears to reaccumulate fluid quickly, consider pleurx catheter.  Could potentially be done ON plavix according to radiologist -  Bone scan negative for bony mets -  MRI brain ordered by oncology is pending -  XRT planning [facility] at [facility] -  Transfer to [facility] for ongoing care -  Repeat CXR in AM -  Possible aspiration into right lung on CT, more dyspneic, and rising WBC > start unasyn   Coronary artery disease, severe 3V disease, not a candidate for CABG due to COPD  Chest pain free -  Had DES placed to the RCA on 05/17/2016 -  Cardiology recommends NOT suspending aspirin or plavix at this time due to high risk of in-stent thrombosis -  Continue BB -  Not on statin due to age and frailty -  Continue ranolazine and imdur  Chronic diastolic and systolic heart failure ([place]), EF of 30-35 percent - Pt on isordil and B blocker - no ACEI/ARB or spironolactone due to CKD - diuretics as above  Paroxismal atrial fibrillation.  -  CHADs2vasc = 5    -  No a/c due to possible upcoming procedures/nearing end of life -  Tele:  SR, okay to d/c telemetry  Aortic atherosclerosis with 5.4 cm x 3.9 cm AAA, continue aspirin.  No statin due to age and comorbidities  COPD (chronic obstructive pulmonary disease) (HCC)/ ILD (interstitial lung disease) (HCC) -  High perioperative risk, not a good surgical candidate  Essential hypertension - continue current antihypertensive regimen  CKD stage IV, creatinine stable near 2.5 minimize nephrotoxins and renally dose medications  Healing rib fractures -  PT  recommending SNF -  Continue morphine per palliative care  Buttock pain -  Increase frequency of PRN morphine -  Add stool softener and laxative  Hyponatremia due to pleural effusion and lung CA, trending down slightly.  Repeat in AM  Anemia of chronic disease and renal disease -  No need for blood transfusion today.  DVT prophylaxis:  heparin Code Status:  DNR Family Communication:  Patient alone Disposition Plan:   Plan to SNF for two weeks for 10 treatments of XRT, then transition to home with hospice or to residential hospice depending on progression.     Consultants:   Pulmonology  Procedures:  L thora 6/7:  1.5L removed  Antimicrobials:   Unasyn 6/16 >    Subjective: A little more SOB today while lying more flat.  Denies chest pains.  Dr. Marin Ford told him that he is not a candidate for chemotherapy this morning but the he would like to try some palliative XRT  Objective: Filed Vitals:   06/15/16 0141 06/15/16 0600 06/15/16 0724 06/15/16 1032  BP: 126/70 135/60  124/64  Pulse: 97 100  93  Temp:  97.9 F (36.6 C)    TempSrc:  Oral    Resp:  22  18  Height:      Weight:  78.019 kg (172 lb)    SpO2:  91% 93% 90%    Intake/Output Summary (Last 24 hours) at 06/15/16 1213 Last data filed at 06/15/16 0844  Gross per 24 hour  Intake    120 ml  Output    800 ml  Net   -680 ml   Filed Weights   06/13/16 0502 06/14/16 0445 06/15/16 0600  Weight: 76.522 kg (168 lb 11.2 oz) 75.388 kg (166 lb 3.2 oz) 78.019 kg (172 lb)    Examination:  General exam:  Adult male.  Mild tachypnea while lying at 30 degrees HEENT:  NCAT, MMM Respiratory system:  Diminished at the left base with rales heard at the bilateral bases.   Cardiovascular system:  RRR, positive S2 click.  No gallops.  Warm extremities Gastrointestinal system: Normal active bowel sounds, soft, mildly distended, nontender. MSK:  Normal tone and bulk, trace bilateral lower extremity edema Neuro:  Grossly  intact    Data Reviewed: I have personally reviewed following labs and imaging studies  CBC:  Recent Labs Lab 06/09/16 0224 06/10/16 0409 06/11/16 1050 06/12/16 0045 06/15/16 0401  WBC 13.2* 15.0* 13.5* 13.9* 15.3*  HGB 9.8* 9.8* 9.4* 8.8* 9.0*  HCT 32.0* 31.7* 30.2* 27.6* 28.9*  MCV 97.9 96.1 95.3 93.6 94.1  PLT 354 333 326 307 767   Basic Metabolic Panel:  Recent Labs Lab 06/11/16 1050 06/12/16 2339 06/14/16 0534 06/14/16 0917 06/15/16 0401  NA 131* 131* 132* 132* 130*  K 5.0 5.0 4.9 5.0 4.8  CL 99* 99* 98* 96* 97*  CO2 21* 21* 21* 21* 22  GLUCOSE 265* 142* 164* 206* 116*  BUN 37* 48* 52* 53* 63*  CREATININE 2.46* 2.38* 2.39* 2.54* 2.51*  CALCIUM 8.5* 8.7* 9.0 9.1  8.8*   GFR: Estimated Creatinine Clearance: 23 mL/min (by C-G formula based on Cr of 2.51). Liver Function Tests:  Recent Labs Lab 06/14/16 0917  AST 40  ALT 33  ALKPHOS 79  BILITOT 0.4  PROT 6.2*  ALBUMIN 1.7*   No results for input(s): LIPASE, AMYLASE in the last 168 hours. No results for input(s): AMMONIA in the last 168 hours. Coagulation Profile: No results for input(s): INR, PROTIME in the last 168 hours. Cardiac Enzymes: No results for input(s): CKTOTAL, CKMB, CKMBINDEX, TROPONINI in the last 168 hours. BNP (last 3 results)  Recent Labs  05/14/16 1012  PROBNP 1454.0*   HbA1C: No results for input(s): HGBA1C in the last 72 hours. CBG:  Recent Labs Lab 06/14/16 1258 06/14/16 1715 06/14/16 2206 06/15/16 0636 06/15/16 1128  GLUCAP 181* 131* 262* 144* 162*   Lipid Profile: No results for input(s): CHOL, HDL, LDLCALC, TRIG, CHOLHDL, LDLDIRECT in the last 72 hours. Thyroid Function Tests: No results for input(s): TSH, T4TOTAL, FREET4, T3FREE, THYROIDAB in the last 72 hours. Anemia Panel: No results for input(s): VITAMINB12, FOLATE, FERRITIN, TIBC, IRON, RETICCTPCT in the last 72 hours. Urine analysis:    Component Value Date/Time   COLORURINE STRAW* 05/15/2016 1927    APPEARANCEUR CLEAR 05/15/2016 1927   LABSPEC 1.016 05/15/2016 1927   PHURINE 6.5 05/15/2016 1927   GLUCOSEU 250* 05/15/2016 1927   HGBUR SMALL* 05/15/2016 1927   BILIRUBINUR NEGATIVE 05/15/2016 1927   KETONESUR NEGATIVE 05/15/2016 1927   PROTEINUR 100* 05/15/2016 1927   UROBILINOGEN 0.2 10/06/2009 0923   NITRITE NEGATIVE 05/15/2016 1927   LEUKOCYTESUR NEGATIVE 05/15/2016 1927   Sepsis Labs: '@LABRCNTIP'$ (procalcitonin:4,lacticidven:4)  ) Recent Results (from the past 240 hour(s))  Blood culture (routine x 2)     Status: None   Collection Time: 06/17/2016 10:18 PM  Result Value Ref Range Status   Specimen Description BLOOD LEFT ARM  Final   Special Requests IN PEDIATRIC BOTTLE 3CC  Final   Culture NO GROWTH 5 DAYS  Final   Report Status 06/10/2016 FINAL  Final  Blood culture (routine x 2)     Status: None   Collection Time: 06/16/2016 10:23 PM  Result Value Ref Range Status   Specimen Description BLOOD LEFT HAND  Final   Special Requests IN PEDIATRIC BOTTLE 3CC  Final   Culture NO GROWTH 5 DAYS  Final   Report Status 06/10/2016 FINAL  Final  Culture, body fluid-bottle     Status: None   Collection Time: 06/06/16  2:43 PM  Result Value Ref Range Status   Specimen Description PLEURAL LEFT  Final   Special Requests AEROBIC BOTTLE ONLY 6CC  Final   Culture NO GROWTH 5 DAYS  Final   Report Status 06/11/2016 FINAL  Final  Gram stain     Status: None   Collection Time: 06/06/16  2:43 PM  Result Value Ref Range Status   Specimen Description PLEURAL LEFT  Final   Special Requests NONE  Final   Gram Stain   Final    ABUNDANT WBC PRESENT,BOTH PMN AND MONONUCLEAR NO ORGANISMS SEEN    Report Status 06/06/2016 FINAL  Final      Radiology Studies: Ct Abdomen Pelvis Wo Contrast  06/14/2016  CLINICAL DATA:  Restaging metastatic right lung cancer. Malignant cells on left-sided thoracentesis performed 06/06/2016. History of diabetes, hypertension and congestive heart failure. EXAM: CT  CHEST, ABDOMEN AND PELVIS WITHOUT CONTRAST TECHNIQUE: Multidetector CT imaging of the chest, abdomen and pelvis was performed following the standard  protocol without IV contrast. COMPARISON:  Chest CT 05/16/2016, PET-CT 09/27/2015 and abdominal CT 09/23/2006. FINDINGS: CT CHEST Mediastinum/Nodes: Compared with the prior examinations, there are multiple enlarging mediastinal and left hilar lymph nodes. There is a stable small calcified right hilar node. 19 mm AP window node on image 25 is not significantly changed. 2.9 cm Monifa Blanchette axis subcarinal node on image 37 previously measured 1.9 cm. There is an ill-defined left perihilar mass, constricting the central left bronchi. This mass is not actively measured due to the lack of contrast and collapse of the left lower lobe. There is some retained contrast within the esophagus. The heart size is normal. There is no pericardial effusion. There is extensive atherosclerosis of the aorta, great vessels and coronary arteries. Lungs/Pleura: Left pleural effusion has mildly enlarged compared with the prior chest CT. There is a component loculated within the superior aspect of the fissure and at the apex. No significant pleural fluid on the right. Again demonstrated is extensive centrilobular emphysema. No focally suspicious findings are seen within the right lung. As above, there is evidence of a central left perihilar mass with complete collapse of the left lower lobe. There is new dependent high-density material within the left lung, suspicious for aspiration. There are coarsened interstitial and airspace opacities extending from the left hilum, likely in part secondary to postobstructive pneumonitis. Musculoskeletal/Chest wall: No chest wall mass or suspicious osseous findings. CT ABDOMEN AND PELVIS FINDINGS Hepatobiliary: 8 mm probable cyst inferiorly in the right hepatic lobe on image 70 and 12 mm lesion in the left lobe on image 57 are unchanged from prior PET-CT. As  evaluated in the noncontrast state, the liver demonstrates no suspicious findings. There is some layering sludge or small stones in the gallbladder. No gallbladder wall thickening or biliary dilatation. Pancreas: Unremarkable. No pancreatic ductal dilatation or surrounding inflammatory changes. Spleen: Normal in size without focal abnormality. Adrenals/Urinary Tract: Both adrenal glands appear normal. Both kidneys demonstrate cortical thinning and lobularity. No evidence renal mass on noncontrast imaging. There is no evidence of urinary tract calculus or hydronephrosis. The bladder is mildly distended without apparent focal abnormality. Stomach/Bowel: No evidence of bowel wall thickening, distention or surrounding inflammatory change. There is prominent stool in the rectum. The appendix appears normal. Vascular/Lymphatic: 11 mm celiac node on image 61 has mildly enlarged. No other enlarged abdominal pelvic lymph nodes are identified. There is extensive aortic and branch vessel atherosclerosis with an eccentric aneurysm of the mid abdominal aorta, measuring up to 5.4 x 3.9 cm transverse on image 76. This is similar to previous PET-CT. Reproductive: The prostate gland and seminal vesicles appear unremarkable. Penile prosthesis noted. Other: No evidence of abdominal wall hernia or mass. There is no ascites or peritoneal nodularity. Musculoskeletal: No acute or significant osseous findings. Mild degenerative changes in the spine and sacroiliac joints. IMPRESSION: 1. Interval development of a large ill-defined left perihilar mass with resulting subtotal collapse of the left lung, progressive mediastinal lymphadenopathy and a complex left pleural effusion, consistent with locally advanced left lung cancer. Consider PET-CT for further staging. 2. No suspicious right lung findings. There is diffuse emphysema and suspicion of aspiration into the left lung. 3. Interval enlargement of a single celiac node. No other evidence of  metastatic disease within the abdomen or pelvis. 4. Extensive atherosclerosis with grossly stable with eccentric abdominal aortic aneurysm. Electronically Signed   By: Richardean Sale M.D.   On: 06/14/2016 11:13   Ct Chest Wo Contrast  06/14/2016  CLINICAL DATA:  Restaging metastatic right lung cancer. Malignant cells on left-sided thoracentesis performed 06/06/2016. History of diabetes, hypertension and congestive heart failure. EXAM: CT CHEST, ABDOMEN AND PELVIS WITHOUT CONTRAST TECHNIQUE: Multidetector CT imaging of the chest, abdomen and pelvis was performed following the standard protocol without IV contrast. COMPARISON:  Chest CT 05/16/2016, PET-CT 09/27/2015 and abdominal CT 09/23/2006. FINDINGS: CT CHEST Mediastinum/Nodes: Compared with the prior examinations, there are multiple enlarging mediastinal and left hilar lymph nodes. There is a stable small calcified right hilar node. 19 mm AP window node on image 25 is not significantly changed. 2.9 cm Sorayah Schrodt axis subcarinal node on image 37 previously measured 1.9 cm. There is an ill-defined left perihilar mass, constricting the central left bronchi. This mass is not actively measured due to the lack of contrast and collapse of the left lower lobe. There is some retained contrast within the esophagus. The heart size is normal. There is no pericardial effusion. There is extensive atherosclerosis of the aorta, great vessels and coronary arteries. Lungs/Pleura: Left pleural effusion has mildly enlarged compared with the prior chest CT. There is a component loculated within the superior aspect of the fissure and at the apex. No significant pleural fluid on the right. Again demonstrated is extensive centrilobular emphysema. No focally suspicious findings are seen within the right lung. As above, there is evidence of a central left perihilar mass with complete collapse of the left lower lobe. There is new dependent high-density material within the left lung,  suspicious for aspiration. There are coarsened interstitial and airspace opacities extending from the left hilum, likely in part secondary to postobstructive pneumonitis. Musculoskeletal/Chest wall: No chest wall mass or suspicious osseous findings. CT ABDOMEN AND PELVIS FINDINGS Hepatobiliary: 8 mm probable cyst inferiorly in the right hepatic lobe on image 70 and 12 mm lesion in the left lobe on image 57 are unchanged from prior PET-CT. As evaluated in the noncontrast state, the liver demonstrates no suspicious findings. There is some layering sludge or small stones in the gallbladder. No gallbladder wall thickening or biliary dilatation. Pancreas: Unremarkable. No pancreatic ductal dilatation or surrounding inflammatory changes. Spleen: Normal in size without focal abnormality. Adrenals/Urinary Tract: Both adrenal glands appear normal. Both kidneys demonstrate cortical thinning and lobularity. No evidence renal mass on noncontrast imaging. There is no evidence of urinary tract calculus or hydronephrosis. The bladder is mildly distended without apparent focal abnormality. Stomach/Bowel: No evidence of bowel wall thickening, distention or surrounding inflammatory change. There is prominent stool in the rectum. The appendix appears normal. Vascular/Lymphatic: 11 mm celiac node on image 61 has mildly enlarged. No other enlarged abdominal pelvic lymph nodes are identified. There is extensive aortic and branch vessel atherosclerosis with an eccentric aneurysm of the mid abdominal aorta, measuring up to 5.4 x 3.9 cm transverse on image 76. This is similar to previous PET-CT. Reproductive: The prostate gland and seminal vesicles appear unremarkable. Penile prosthesis noted. Other: No evidence of abdominal wall hernia or mass. There is no ascites or peritoneal nodularity. Musculoskeletal: No acute or significant osseous findings. Mild degenerative changes in the spine and sacroiliac joints. IMPRESSION: 1. Interval  development of a large ill-defined left perihilar mass with resulting subtotal collapse of the left lung, progressive mediastinal lymphadenopathy and a complex left pleural effusion, consistent with locally advanced left lung cancer. Consider PET-CT for further staging. 2. No suspicious right lung findings. There is diffuse emphysema and suspicion of aspiration into the left lung. 3. Interval enlargement of a single celiac node. No other evidence of  metastatic disease within the abdomen or pelvis. 4. Extensive atherosclerosis with grossly stable with eccentric abdominal aortic aneurysm. Electronically Signed   By: Richardean Sale M.D.   On: 06/14/2016 11:13   Nm Bone Scan Whole Body  06/14/2016  CLINICAL DATA:  Metastatic lung cancer, shortness of breath, pneumonia EXAM: NUCLEAR MEDICINE WHOLE BODY BONE SCAN TECHNIQUE: Whole body anterior and posterior images were obtained approximately 3 hours after intravenous injection of radiopharmaceutical. RADIOPHARMACEUTICALS:  55.5 mCi Technetium-19mMDP IV COMPARISON:  None; correlation CT chest abdomen pelvis 06/15/2016, PET-CT 09/27/2015 the FINDINGS: Tracer within a distended urinary bladder obscures portions the pelvis. Uptake at anterior RIGHT sixth seventh eighth and questionably ninth ribs, in a linear orientation, likely representing sequela of trauma ; patient has multiple healing fractures of lower anterior RIGHT ribs on CT. Minimal uptake at the shoulders, LEFT elbow, wrists, and toes typically degenerative. No additional sites of abnormal osseous tracer accumulation identified which are suspicious for osseous metastatic disease. Expected urinary tract and soft tissue distribution of tracer. IMPRESSION: No definite scintigraphic evidence of osseous metastatic disease. Uptake in multiple adjacent anterior lower RIGHT ribs compatible with prior trauma/fractures. Electronically Signed   By: MLavonia DanaM.D.   On: 06/14/2016 14:58     Scheduled Meds: .  antiseptic oral rinse  7 mL Mouth Rinse q12n4p  . aspirin EC  81 mg Oral QHS  . budesonide (PULMICORT) nebulizer solution  0.5 mg Nebulization BID  . carvedilol  3.125 mg Oral BID WC  . chlorhexidine  15 mL Mouth Rinse BID  . clopidogrel  75 mg Oral Daily  . heparin subcutaneous  5,000 Units Subcutaneous Q8H  . insulin aspart  0-9 Units Subcutaneous TID WC & HS  . ipratropium  0.5 mg Nebulization TID  . isosorbide dinitrate  10 mg Oral BID  . levalbuterol  0.63 mg Nebulization TID  . ranolazine  500 mg Oral BID  . sodium chloride flush  3 mL Intravenous Q12H  . sodium chloride flush  3 mL Intravenous Q12H   Continuous Infusions:    LOS: 10 days    Time spent: 30 min    SJanece Canterbury MD [facility] Pager [phone] If 7PM-7AM, please contact night-coverage [email or web address] Password [facility]6/16/2017, 12:13 PM

## 2016-06-15 NOTE — Telephone Encounter (Signed)
   Pre-Radiation Note:  Inpatient nurse name: PHil/Annie,   Time Called: 7:12 AM  Called again at 69am spoke with Annie,RN, called 0836am, Deneise Lever RN wil call [facility] for trasnport to be here at 1pm  Inpatient nurse to call [facility]: Yes.   waiting for RN Phil to call back, in with the patient  [facility] called to verify transportation: Yes.   Time: 0720am  Patient Status:  Sob, needs  Oxygen, heplocked,   Pain:  None  Pain medication given:  Mobility Orders: in bed   Treatment Site: Lung Additional Injuries:   Consent:    Is patient able to sign consent: YES Family member called: No. Name/Time:   Rebecca Eaton, RN 06/15/2016,7:12 AM

## 2016-06-15 NOTE — Plan of Care (Cosign Needed)
Problem: Phase I Progression Outcomes Goal: O2 sats > or equal 90% or at baseline Outcome: Progressing Was kept on 5-6 l/min [place] occasionally SOB . For radiation of the lung L.

## 2016-06-15 NOTE — Progress Notes (Signed)
1415   Report given to [facility] . To [facility] via [place] team. Family iin attendance aware of transfer and plan of care

## 2016-06-15 NOTE — Plan of Care (Signed)
Problem: Phase I Progression Outcomes Goal: Dyspnea controlled at rest Outcome: Progressing No sob on restnoted

## 2016-06-15 NOTE — Telephone Encounter (Signed)
Called the floor [phone].,spoke with FredRN, patient;'s o2 sats drops mid 80's when lying flat, was put on 7 liters o2 by [facility]  RN sats up to 92, now on l liters 90% sats,  paruetn mouth breather  3:52 PM

## 2016-06-15 NOTE — Progress Notes (Signed)
Unfortunately, the test results that we did yesterday are quite concerning and yet quite revealing. On his repeat CT scan, it looks like he is trying to obstruct his left lingula and left lower lobe. He has a left perihilar mass that is doing this. There is a mildly enlarged left lower effusion. He has extensive emphysema. He may have post obstructive pneumonitis.  There is no obvious disease noted in the abdomen or pelvis. Has a mildly enlarged celiac node. Not surprisingly, he has extensive atherosclerotic disease within his aorta.  A bone scan that was done was negative for any bony metastasis.  I think what is most important is that his prealbumin is only 10. Other than this is incredibly prognostic. This on top of his renal insufficiency and cardiac dysfunction, I think really makes any type of aggressive systemic therapy impossible to pursue. I told him that I do not think that he is a candidate for chemotherapy. I think the chance of chemotherapy helping him would be less than 10%. He understands this.  I think the only option that we have for him systemically is immunotherapy. I will talk to pathology again to see if they can run the PD-L1 assay. I think the chance of him being positive for this is probably less than 10%. Thereafter the pathologist does not think that there is enough material to run the genetic driver markers. He needs to be on Plavix. He cannot come off Plavix. As such, we cannot kid any additional material for study. We cannot do anything "liquid biopsies" because he is hospitalized.  He is having some more shortness of breath. Radiation oncology saw him. I totally appreciate their input. It sounds like he might be going over to [facility] for palliative radiation. At this point, I think this is the best that we can do for him for his comfort.  He understands that our options are incredibly limited. I told him that I thought that his prognosis probably would be no  more than 2 months. If we cannot use immunotherapy, I think he clearly has less than 2 months. We can use immunotherapy, then he might be over to get 5 or 6 months.  We talked about hospice. He is amenable to hospice. I think studies are clearly shown that hospice involvement early on in the course of stage IV lung cancer does seem to help patient quality of life and also to some degree quantity of life.  He wants quality of life. He just does not want to exist. I totally agree with this.  His hemoglobin is 9 area and his erythropoietin level is only 32. I'm sure the anemia is from his renal insufficiency. I probably would not give him Aranesp as I think he would be at high risk for cardiovascular side effects. I probable off on transfusing him right now.  His appetite is okay. He really does not have a lot of hunger. He is not having any nausea or vomiting.  On his exam, his vital signs are relatively stable. His blood pressure 135/60. His temperature is 97.9. Pulse is 100. His lungs sound decent on the right side. Left side he has decreased breath sounds. He has wheezing. He has decreased air movement. Cardiac exam is tachycardic and somewhat irregular. Abdomen is soft. Bowel sounds are slightly decreased. Extremities shows no edema. Has some muscle atrophy in upper and lower extremities.  Overall, I think the prognosis for Alan Ford is very limited. Our options are very  very short because of his poor performance status. Currently, his performance status is probably no more than ECOG 3.  He understands that there is no chance of giving him systemic chemotherapy. Maybe, if he is PD-L1 positive, then we might be oh to use immunotherapy.  I think for right now, palate of radiation to try to help open his left lung is the best we can do for him. I do not want see him get postobstructive pneumonia which I think would be lethal for him.  We reaffirmed his DO NOT RESUSCITATE status. I totally agree with  this.  I appreciate everybody's outstanding care for him. This is a truly, complicated case. Our focus needs to be quality of life and palliation of symptoms.  Alan E.  Hebrews 12:12

## 2016-06-16 ENCOUNTER — Inpatient Hospital Stay (HOSPITAL_COMMUNITY): Payer: Medicare Other

## 2016-06-16 DIAGNOSIS — C7800 Secondary malignant neoplasm of unspecified lung: Secondary | ICD-10-CM | POA: Insufficient documentation

## 2016-06-16 DIAGNOSIS — C7801 Secondary malignant neoplasm of right lung: Secondary | ICD-10-CM

## 2016-06-16 LAB — BASIC METABOLIC PANEL
ANION GAP: 11 (ref 5–15)
BUN: 75 mg/dL — ABNORMAL HIGH (ref 6–20)
CHLORIDE: 98 mmol/L — AB (ref 101–111)
CO2: 23 mmol/L (ref 22–32)
Calcium: 8.7 mg/dL — ABNORMAL LOW (ref 8.9–10.3)
Creatinine, Ser: 2.92 mg/dL — ABNORMAL HIGH (ref 0.61–1.24)
GFR calc non Af Amer: 18 mL/min — ABNORMAL LOW (ref >60)
GFR, EST AFRICAN AMERICAN: 21 mL/min — AB (ref >60)
Glucose, Bld: 154 mg/dL — ABNORMAL HIGH (ref 65–99)
Potassium: 5.3 mmol/L — ABNORMAL HIGH (ref 3.5–5.1)
Sodium: 132 mmol/L — ABNORMAL LOW (ref 135–145)

## 2016-06-16 LAB — GLUCOSE, CAPILLARY
GLUCOSE-CAPILLARY: 166 mg/dL — AB (ref 65–99)
GLUCOSE-CAPILLARY: 147 mg/dL — AB (ref 65–99)
Glucose-Capillary: 206 mg/dL — ABNORMAL HIGH (ref 65–99)
Glucose-Capillary: 178 mg/dL — ABNORMAL HIGH (ref 65–99)

## 2016-06-16 MED ORDER — SODIUM POLYSTYRENE SULFONATE 15 GM/60ML PO SUSP
15.0000 g | Freq: Once | ORAL | Status: AC
  Administered 2016-06-16: 15 g via ORAL
  Filled 2016-06-16: qty 60

## 2016-06-16 NOTE — Progress Notes (Signed)
PROGRESS NOTE  Alan Ford  NOM:[id number] DOB: 1930/08/29 DOA: 06/27/2016 PCP: Mathews Argyle, MD  Brief Narrative:   82yo male former smoker with diabetes mellitus type 2, chronic dCHF, atrial fibrillation, CKD stage 4, OSA on CPAP, ILD (NSIP) and COPD on 3L home O2 followed by Dr. Lamonte Sakai. He was recently admitted from 05/14/2016-05/22/2016 for NSTEMI and had severe three vessel disease.  He was deemed not a candidate for CABG due to his ILD and severe COPD so he had a DES placed to his RCA on 05/17/2016. He returned 6/6 with c/o progressive dyspnea, orthopnea, cough. CXR revealed an increase in his known left pleural effusion and worsening pulmonary edema. He was started on bipap and admitted to SDU where PCCM was consulted. He underwent LEFT thoracentesis with 1.5 L removed 6/7.  Fluid was exudate by LDH only (marked pleural elevation to 1217, serum 188) and cytology demonstrated undifferentiated malignant cells.  CT chest suggests a primary lung mass and stage 4 cancer given malignant pleural effusion.  Oncology has been consulted.  Due to his deconditioning and malnutrition, he is not a good candidate for chemotherapy.  He will undergo palliative XRT to the left chest and plan to discharge to SNF until his treatments are complete.  Plan for home with hospice care (or residential hospice depending on progression and patient preference) after completion of his XRT.    Assessment & Plan:   Principal Problem:   Acute on chronic respiratory failure with hypoxia (HCC) Active Problems:   Chronic diastolic heart failure (HCC)   Dyspnea   COPD (chronic obstructive pulmonary disease) (HCC)   ILD (interstitial lung disease) (HCC)   Essential hypertension   CHF (congestive heart failure) (HCC)   Coronary artery disease involving native coronary artery of native heart without angina pectoris   Lung mass   Pleural effusion on left   Acute on chronic renal failure (HCC)   Lymphadenopathy   DNR  (do not resuscitate)   Palliative care encounter   S/P thoracentesis   Acute on chronic respiratory failure with hypoxia ([place]) likely secondary to malignant pleural effusion, undifferentiated, likely lung primary cancer, and possible aspiration pneumonia - appreciate pulmonology and oncology assistance -  S/p LEFT thoracentesis with improvement after removal of 1.5L of fluid -  continue lasix at current dose -  If he appears to reaccumulate fluid quickly, consider pleurx catheter.  Could potentially be done ON plavix according to radiologist -  Bone scan negative for bony mets -  MRI brain ordered by oncology is pending -  XRT planned for Monday  -  Repeat chest x-ray shows some extensive patchy consolidation throughout the left lung, and some mild patchy opacity in the mid to lower right lung. -   Continue unasyn   Coronary artery disease, severe 3V disease, not a candidate for CABG due to COPD  Chest pain free -  Had DES placed to the RCA on 05/17/2016 -  Cardiology recommends NOT suspending aspirin or plavix at this time due to high risk of in-stent thrombosis -  Continue BB -  Not on statin due to age and frailty -  Continue ranolazine and imdur  Chronic diastolic and systolic heart failure ([place]), EF of 30-35 percent - Pt on isordil and B blocker - no ACEI/ARB or spironolactone due to CKD - diuretics as above  Paroxismal atrial fibrillation.  -  CHADs2vasc = 5    -  No anticoagulation due to possible upcoming procedures/nearing end of life -  Tele:  SR, okay to d/c telemetry  Aortic atherosclerosis with 5.4 cm x 3.9 cm AAA, continue aspirin.  No statin due to age and comorbidities  COPD (chronic obstructive pulmonary disease) (HCC)/ ILD (interstitial lung disease) (HCC) -  High perioperative risk, not a good surgical candidate  Essential hypertension - continue current antihypertensive regimen  CKD stage IV, creatinine is worse today, creatinine 2.92. Will follow BMP in  a.m.                                      Healing rib fractures -  PT recommending SNF -  Continue morphine per palliative care  Hyponatremia due to pleural effusion and lung CA, improved today's sodium 132.  Hyperkalemia- we will give him dose of Kayexalate and check BMP in a.m.  Anemia of chronic disease and renal disease -  No need for blood transfusion today.  DVT prophylaxis:  heparin Code Status:  DNR Family Communication:  No family at bedside Disposition Plan:   Plan to SNF for two weeks for 10 treatments of XRT, then transition to home with hospice or to residential hospice depending on progression.     Consultants:   Pulmonology  Procedures:  L thora 6/7:  1.5L removed  Antimicrobials:   Unasyn 6/16 >    Subjective: Patient seen and examined, transferred from [facility] for radiation treatment scheduled on Monday. Still complains of some chest heaviness. Morphine when necessary ordered for pain.  Objective: Filed Vitals:   06/15/16 2201 06/16/16 0553 06/16/16 0844 06/16/16 0924  BP: 124/67 122/67 127/69   Pulse: 87 100 99   Temp: 98 F (36.7 C) 98 F (36.7 C)    TempSrc: Oral Oral    Resp: 18 16    Height:      Weight:  76.975 kg (169 lb 11.2 oz)    SpO2: 93% 94%  93%    Intake/Output Summary (Last 24 hours) at 06/16/16 1106 Last data filed at 06/16/16 1035  Gross per 24 hour  Intake      3 ml  Output    500 ml  Net   -497 ml   Filed Weights   06/14/16 0445 06/15/16 0600 06/16/16 0553  Weight: 75.388 kg (166 lb 3.2 oz) 78.019 kg (172 lb) 76.975 kg (169 lb 11.2 oz)    Examination:  Physical Exam: Eyes: No icterus, extraocular muscles intact  Mouth: Oral mucosa is moist, no lesions on palate,  Neck: Supple, no deformities, masses, or tenderness Lungs: Normal respiratory effort, bilateral clear to auscultation, no crackles or wheezes.  Heart: Regular rate and rhythm, S1 and S2 normal, no murmurs, rubs auscultated Abdomen: BS  normoactive,soft,nondistended,non-tender to palpation,no organomegaly Extremities: No pretibial edema, no erythema, no cyanosis, no clubbing Neuro : Alert and oriented to time, place and person, No focal deficits Skin: No rashes seen on exam      Data Reviewed: I have personally reviewed following labs and imaging studies  CBC:  Recent Labs Lab 06/10/16 0409 06/11/16 1050 06/12/16 0045 06/15/16 0401  WBC 15.0* 13.5* 13.9* 15.3*  HGB 9.8* 9.4* 8.8* 9.0*  HCT 31.7* 30.2* 27.6* 28.9*  MCV 96.1 95.3 93.6 94.1  PLT 333 326 307 833   Basic Metabolic Panel:  Recent Labs Lab 06/12/16 2339 06/14/16 0534 06/14/16 0917 06/15/16 0401 06/16/16 0355  NA 131* 132* 132* 130* 132*  K 5.0 4.9 5.0 4.8 5.3*  CL 99* 98* 96* 97* 98*  CO2 21* 21* 21* 22 23  GLUCOSE 142* 164* 206* 116* 154*  BUN 48* 52* 53* 63* 75*  CREATININE 2.38* 2.39* 2.54* 2.51* 2.92*  CALCIUM 8.7* 9.0 9.1 8.8* 8.7*   GFR: Estimated Creatinine Clearance: 19.8 mL/min (by C-G formula based on Cr of 2.92). Liver Function Tests:  Recent Labs Lab 06/14/16 0917  AST 40  ALT 33  ALKPHOS 79  BILITOT 0.4  PROT 6.2*  ALBUMIN 1.7*   BNP (last 3 results)  Recent Labs  05/14/16 1012  PROBNP 1454.0*   HbA1C: No results for input(s): HGBA1C in the last 72 hours. CBG:  Recent Labs Lab 06/14/16 2206 06/15/16 0636 06/15/16 1128 06/15/16 2200 06/16/16 0744  GLUCAP 262* 144* 162* 215* 206*   Urine analysis:    Component Value Date/Time   COLORURINE STRAW* 05/15/2016 1927   APPEARANCEUR CLEAR 05/15/2016 1927   LABSPEC 1.016 05/15/2016 1927   PHURINE 6.5 05/15/2016 1927   GLUCOSEU 250* 05/15/2016 1927   HGBUR SMALL* 05/15/2016 1927   BILIRUBINUR NEGATIVE 05/15/2016 1927   KETONESUR NEGATIVE 05/15/2016 1927   PROTEINUR 100* 05/15/2016 1927   UROBILINOGEN 0.2 10/06/2009 0923   NITRITE NEGATIVE 05/15/2016 1927   LEUKOCYTESUR NEGATIVE 05/15/2016 1927   Sepsis  Labs: '@LABRCNTIP'$ (procalcitonin:4,lacticidven:4)  ) Recent Results (from the past 240 hour(s))  Culture, body fluid-bottle     Status: None   Collection Time: 06/06/16  2:43 PM  Result Value Ref Range Status   Specimen Description PLEURAL LEFT  Final   Special Requests AEROBIC BOTTLE ONLY 6CC  Final   Culture NO GROWTH 5 DAYS  Final   Report Status 06/11/2016 FINAL  Final  Gram stain     Status: None   Collection Time: 06/06/16  2:43 PM  Result Value Ref Range Status   Specimen Description PLEURAL LEFT  Final   Special Requests NONE  Final   Gram Stain   Final    ABUNDANT WBC PRESENT,BOTH PMN AND MONONUCLEAR NO ORGANISMS SEEN    Report Status 06/06/2016 FINAL  Final      Radiology Studies: Nm Bone Scan Whole Body  06/14/2016  CLINICAL DATA:  Metastatic lung cancer, shortness of breath, pneumonia EXAM: NUCLEAR MEDICINE WHOLE BODY BONE SCAN TECHNIQUE: Whole body anterior and posterior images were obtained approximately 3 hours after intravenous injection of radiopharmaceutical. RADIOPHARMACEUTICALS:  55.5 mCi Technetium-46mMDP IV COMPARISON:  None; correlation CT chest abdomen pelvis 06/15/2016, PET-CT 09/27/2015 the FINDINGS: Tracer within a distended urinary bladder obscures portions the pelvis. Uptake at anterior RIGHT sixth seventh eighth and questionably ninth ribs, in a linear orientation, likely representing sequela of trauma ; patient has multiple healing fractures of lower anterior RIGHT ribs on CT. Minimal uptake at the shoulders, LEFT elbow, wrists, and toes typically degenerative. No additional sites of abnormal osseous tracer accumulation identified which are suspicious for osseous metastatic disease. Expected urinary tract and soft tissue distribution of tracer. IMPRESSION: No definite scintigraphic evidence of osseous metastatic disease. Uptake in multiple adjacent anterior lower RIGHT ribs compatible with prior trauma/fractures. Electronically Signed   By: MLavonia DanaM.D.    On: 06/14/2016 14:58   Dg Chest Port 1 View  06/16/2016  CLINICAL DATA:  Dyspnea EXAM: PORTABLE CHEST 1 VIEW COMPARISON:  06/10/2016 chest radiograph. FINDINGS: Stable cardiomediastinal silhouette with normal heart size. No pneumothorax. Moderate left pleural effusion, slightly increased. Patchy consolidation throughout the left lung, worsened. Mild patchy opacity in the mid to lower right lung, worsened. No  right pleural effusion. IMPRESSION: 1. Worsened extensive patchy consolidation throughout the left lung. Worsened mild patchy opacity in the mid to lower right lung. Findings likely represent a combination of left lung mass, atelectasis and aspiration / pneumonia. 2. Slightly increased moderate left pleural effusion. Electronically Signed   By: Ilona Sorrel M.D.   On: 06/16/2016 07:19     Scheduled Meds: . ampicillin-sulbactam (UNASYN) IV  1.5 g Intravenous Q12H  . aspirin EC  81 mg Oral QHS  . bisacodyl  20 mg Rectal Once  . budesonide (PULMICORT) nebulizer solution  0.5 mg Nebulization BID  . carvedilol  3.125 mg Oral BID WC  . clopidogrel  75 mg Oral Daily  . heparin subcutaneous  5,000 Units Subcutaneous Q8H  . insulin aspart  0-9 Units Subcutaneous TID WC & HS  . ipratropium  0.5 mg Nebulization TID  . isosorbide dinitrate  10 mg Oral BID  . levalbuterol  0.63 mg Nebulization TID  . polyethylene glycol  17 g Oral BID  . ranolazine  500 mg Oral BID  . senna  2 tablet Oral QHS  . sodium chloride flush  3 mL Intravenous Q12H  . sodium chloride flush  3 mL Intravenous Q12H   Continuous Infusions:    LOS: 11 days    Time spent: 30 min    Stony Stegmann S, MD [facility] Pager [phone]  If 7PM-7AM, please contact night-coverage [email or web address] Password [email or web address] 06/16/2016, 11:06 AM

## 2016-06-16 NOTE — Progress Notes (Signed)
Mr. Eisenhardt is now over at [facility]. He will start radiation therapy on Monday. He has some markings on his chest wall from radiation.  He is still short of breath. He is still quite feeble. He has some slight chest discomfort. He is on supplemental oxygen.  His appetite is marginal. He's had no nausea or vomiting. He just is not that hungry. I probably would hold off on giving him Megace or Marinol for right now.  There is no bleeding. He is sitting up a little bit.  His labs today shows sodium to be 132 passingly 5.3. Creatinine is 2.92. Calcium is 8.7.  2 days ago, his prealbumin was only 10.  On his physical exam, he is quite frail. His vital signs are stable. His blood pressure is 122/67. Head and neck exam shows no oral lesions. Lungs show a little bit better breath sounds over the right side. He still has decreased breath sounds on the left side. Abdomen is soft. Bowel sounds are present. Cardiac exam is tachycardic but slightly irregular. Extremities shows no clubbing, cyanosis or edema.  For now, our goal with Mr. Dacosta is palliation. Hopefully radiation will help with his breathing so that he will feel better.  Hopefully, the pathologist will be overrun the PD-L1 marker. We cannot get any other tissue because of him being on Plavix.  The outcome is still not that great for Mr. Dieppa. I know that he is trying but his condition and performance status just is not that great.  I appreciate everybody's help in trying to give him some quality of life. He is Patent attorney. As also had a very good prayer session.  Lum Keas

## 2016-06-16 NOTE — Progress Notes (Signed)
Pt continues to refuse CPAP QHS, RT to monitor and assess as needed.  

## 2016-06-17 LAB — BASIC METABOLIC PANEL
ANION GAP: 12 (ref 5–15)
BUN: 82 mg/dL — ABNORMAL HIGH (ref 6–20)
CALCIUM: 8.6 mg/dL — AB (ref 8.9–10.3)
CO2: 23 mmol/L (ref 22–32)
Chloride: 99 mmol/L — ABNORMAL LOW (ref 101–111)
Creatinine, Ser: 3.01 mg/dL — ABNORMAL HIGH (ref 0.61–1.24)
GFR, EST AFRICAN AMERICAN: 20 mL/min — AB (ref >60)
GFR, EST NON AFRICAN AMERICAN: 18 mL/min — AB (ref >60)
Glucose, Bld: 142 mg/dL — ABNORMAL HIGH (ref 65–99)
Potassium: 5.2 mmol/L — ABNORMAL HIGH (ref 3.5–5.1)
Sodium: 134 mmol/L — ABNORMAL LOW (ref 135–145)

## 2016-06-17 LAB — GLUCOSE, CAPILLARY
GLUCOSE-CAPILLARY: 242 mg/dL — AB (ref 65–99)
Glucose-Capillary: 155 mg/dL — ABNORMAL HIGH (ref 65–99)
Glucose-Capillary: 157 mg/dL — ABNORMAL HIGH (ref 65–99)
Glucose-Capillary: 199 mg/dL — ABNORMAL HIGH (ref 65–99)

## 2016-06-17 MED ORDER — GUAIFENESIN 100 MG/5ML PO SOLN
5.0000 mL | ORAL | Status: DC | PRN
  Administered 2016-06-17: 100 mg via ORAL
  Filled 2016-06-17: qty 10

## 2016-06-17 MED ORDER — SODIUM POLYSTYRENE SULFONATE 15 GM/60ML PO SUSP
15.0000 g | Freq: Once | ORAL | Status: AC
  Administered 2016-06-17: 15 g via ORAL
  Filled 2016-06-17 (×2): qty 60

## 2016-06-17 NOTE — Progress Notes (Signed)
PROGRESS NOTE  Alan Ford  AVW:[id number] DOB: [birth year] DOA: 06/16/2016 PCP: Mathews Argyle, MD  Brief Narrative:   82yo male former smoker with diabetes mellitus type 2, chronic dCHF, atrial fibrillation, CKD stage 4, OSA on CPAP, ILD (NSIP) and COPD on 3L home O2 followed by Dr. Lamonte Sakai. He was recently admitted from 05/14/2016-05/22/2016 for NSTEMI and had severe three vessel disease.  He was deemed not a candidate for CABG due to his ILD and severe COPD so he had a DES placed to his RCA on 05/17/2016. He returned 6/6 with c/o progressive dyspnea, orthopnea, cough. CXR revealed an increase in his known left pleural effusion and worsening pulmonary edema. He was started on bipap and admitted to SDU where PCCM was consulted. He underwent LEFT thoracentesis with 1.5 L removed 6/7.  Fluid was exudate by LDH only (marked pleural elevation to 1217, serum 188) and cytology demonstrated undifferentiated malignant cells.  CT chest suggests a primary lung mass and stage 4 cancer given malignant pleural effusion.  Oncology has been consulted.  Due to his deconditioning and malnutrition, he is not a good candidate for chemotherapy.  He will undergo palliative XRT to the left chest and plan to discharge to SNF until his treatments are complete.  Plan for home with hospice care (or residential hospice depending on progression and patient preference) after completion of his XRT.    Assessment & Plan:   Principal Problem:   Acute on chronic respiratory failure with hypoxia (HCC) Active Problems:   Chronic diastolic heart failure (HCC)   Dyspnea   COPD (chronic obstructive pulmonary disease) (HCC)   ILD (interstitial lung disease) (HCC)   Essential hypertension   CHF (congestive heart failure) (HCC)   Coronary artery disease involving native coronary artery of native heart without angina pectoris   Lung mass   Pleural effusion on left   Acute on chronic renal failure (HCC)   Lymphadenopathy   DNR  (do not resuscitate)   Palliative care encounter   S/P thoracentesis   Metastatic lung carcinoma (HCC)   Acute on chronic respiratory failure with hypoxia ([place]) likely secondary to malignant pleural effusion, undifferentiated, likely lung primary cancer, and possible aspiration pneumonia - appreciate pulmonology and oncology assistance -  S/p LEFT thoracentesis with improvement after removal of 1.5L of fluid -  continue lasix at current dose -  If he appears to reaccumulate fluid quickly, consider pleurx catheter.  Could potentially be done ON plavix according to radiologist -  Bone scan negative for bony mets -  MRI brain ordered by oncology is pending -  XRT planned for Monday  -  Repeat chest x-ray shows some extensive patchy consolidation throughout the left lung, and some mild patchy opacity in the mid to lower right lung. -   Continue unasyn   Coronary artery disease, severe 3V disease, not a candidate for CABG due to COPD  Chest pain free -  Had DES placed to the RCA on 05/17/2016 -  Cardiology recommends NOT suspending aspirin or plavix at this time due to high risk of in-stent thrombosis -  Continue BB -  Not on statin due to age and frailty -  Continue ranolazine and imdur  Chronic diastolic and systolic heart failure ([place]), EF of 30-35 percent - Pt on isordil and B blocker - no ACEI/ARB or spironolactone due to CKD - diuretics as above  Paroxismal atrial fibrillation.  -  CHADs2vasc = 5    -  No anticoagulation due to  possible upcoming procedures/nearing end of life -  Tele:  SR, okay to d/c telemetry  Aortic atherosclerosis with 5.4 cm x 3.9 cm AAA, continue aspirin.  No statin due to age and comorbidities  COPD (chronic obstructive pulmonary disease) (HCC)/ ILD (interstitial lung disease) (HCC) -  High perioperative risk, not a good surgical candidate  Essential hypertension - continue current antihypertensive regimen  CKD stage IV, creatinine is worse today,  creatinine 2.92. Will follow BMP in a.m.                                      Healing rib fractures -  PT recommending SNF -  Continue morphine per palliative care  Hyponatremia due to pleural effusion and lung CA, improved today's sodium 132.  Hyperkalemia- we will give him  Another dose of Kayexalate  As potassium is still elevated, and check BMP in a.m.  Anemia of chronic disease and renal disease -  No need for blood transfusion today.  DVT prophylaxis:  heparin Code Status:  DNR Family Communication:  No family at bedside Disposition Plan:   Plan to SNF for two weeks for 10 treatments of XRT, then transition to home with hospice or to residential hospice depending on progression.     Consultants:   Pulmonology  Procedures:  L thora 6/7:  1.5L removed  Antimicrobials:   Unasyn 6/16 >    Subjective: Patient seen and examined, transferred from [facility] for radiation treatment scheduled on Monday. Feels better today  Objective: Filed Vitals:   06/17/16 0621 06/17/16 0759 06/17/16 0810 06/17/16 1000  BP: 137/67   113/65  Pulse: 100   95  Temp: 98 F (36.7 C)     TempSrc: Oral     Resp: 16     Height:      Weight:      SpO2: 92% 86% 86%     Intake/Output Summary (Last 24 hours) at 06/17/16 1321 Last data filed at 06/17/16 1610  Gross per 24 hour  Intake      0 ml  Output    700 ml  Net   -700 ml   Filed Weights   06/15/16 0600 06/16/16 0553 06/17/16 0500  Weight: 78.019 kg (172 lb) 76.975 kg (169 lb 11.2 oz) 76.204 kg (168 lb)    Examination:  Physical Exam: Eyes: No icterus, extraocular muscles intact  Mouth: Oral mucosa is moist, no lesions on palate,  Neck: Supple, no deformities, masses, or tenderness Lungs: Normal respiratory effort, bilateral clear to auscultation, no crackles or wheezes.  Heart: Regular rate and rhythm, S1 and S2 normal, no murmurs, rubs auscultated Abdomen: BS normoactive,soft,nondistended,non-tender to  palpation,no organomegaly Extremities: No pretibial edema, no erythema, no cyanosis, no clubbing Neuro : Alert and oriented to time, place and person, No focal deficits Skin: No rashes seen on exam      Data Reviewed: I have personally reviewed following labs and imaging studies  CBC:  Recent Labs Lab 06/11/16 1050 06/12/16 0045 06/15/16 0401  WBC 13.5* 13.9* 15.3*  HGB 9.4* 8.8* 9.0*  HCT 30.2* 27.6* 28.9*  MCV 95.3 93.6 94.1  PLT 326 307 960   Basic Metabolic Panel:  Recent Labs Lab 06/14/16 0534 06/14/16 0917 06/15/16 0401 06/16/16 0355 06/17/16 0344  NA 132* 132* 130* 132* 134*  K 4.9 5.0 4.8 5.3* 5.2*  CL 98* 96* 97* 98* 99*  CO2 21*  21* '22 23 23  '$ GLUCOSE 164* 206* 116* 154* 142*  BUN 52* 53* 63* 75* 82*  CREATININE 2.39* 2.54* 2.51* 2.92* 3.01*  CALCIUM 9.0 9.1 8.8* 8.7* 8.6*   GFR: Estimated Creatinine Clearance: 19.2 mL/min (by C-G formula based on Cr of 3.01). Liver Function Tests:  Recent Labs Lab 06/14/16 0917  AST 40  ALT 33  ALKPHOS 79  BILITOT 0.4  PROT 6.2*  ALBUMIN 1.7*   BNP (last 3 results)  Recent Labs  05/14/16 1012  PROBNP 1454.0*   HbA1C: No results for input(s): HGBA1C in the last 72 hours. CBG:  Recent Labs Lab 06/16/16 1146 06/16/16 1651 06/16/16 2206 06/17/16 0738 06/17/16 1212  GLUCAP 178* 166* 147* 155* 242*   Urine analysis:    Component Value Date/Time   COLORURINE STRAW* 05/15/2016 1927   APPEARANCEUR CLEAR 05/15/2016 1927   LABSPEC 1.016 05/15/2016 1927   PHURINE 6.5 05/15/2016 1927   GLUCOSEU 250* 05/15/2016 1927   HGBUR SMALL* 05/15/2016 1927   BILIRUBINUR NEGATIVE 05/15/2016 [place] NEGATIVE 05/15/2016 1927   PROTEINUR 100* 05/15/2016 1927   UROBILINOGEN 0.2 10/06/2009 0923   NITRITE NEGATIVE 05/15/2016 1927   LEUKOCYTESUR NEGATIVE 05/15/2016 1927   Sepsis Labs: '@LABRCNTIP'$ (procalcitonin:4,lacticidven:4)  ) No results found for this or any previous visit (from the past 240  hour(s)).    Radiology Studies: Dg Chest Port 1 View  06/16/2016  CLINICAL DATA:  Dyspnea EXAM: PORTABLE CHEST 1 VIEW COMPARISON:  06/10/2016 chest radiograph. FINDINGS: Stable cardiomediastinal silhouette with normal heart size. No pneumothorax. Moderate left pleural effusion, slightly increased. Patchy consolidation throughout the left lung, worsened. Mild patchy opacity in the mid to lower right lung, worsened. No right pleural effusion. IMPRESSION: 1. Worsened extensive patchy consolidation throughout the left lung. Worsened mild patchy opacity in the mid to lower right lung. Findings likely represent a combination of left lung mass, atelectasis and aspiration / pneumonia. 2. Slightly increased moderate left pleural effusion. Electronically Signed   By: Ilona Sorrel M.D.   On: 06/16/2016 07:19     Scheduled Meds: . ampicillin-sulbactam (UNASYN) IV  1.5 g Intravenous Q12H  . aspirin EC  81 mg Oral QHS  . bisacodyl  20 mg Rectal Once  . budesonide (PULMICORT) nebulizer solution  0.5 mg Nebulization BID  . carvedilol  3.125 mg Oral BID WC  . clopidogrel  75 mg Oral Daily  . heparin subcutaneous  5,000 Units Subcutaneous Q8H  . insulin aspart  0-9 Units Subcutaneous TID WC & HS  . ipratropium  0.5 mg Nebulization TID  . isosorbide dinitrate  10 mg Oral BID  . levalbuterol  0.63 mg Nebulization TID  . polyethylene glycol  17 g Oral BID  . ranolazine  500 mg Oral BID  . senna  2 tablet Oral QHS  . sodium chloride flush  3 mL Intravenous Q12H  . sodium chloride flush  3 mL Intravenous Q12H   Continuous Infusions:    LOS: 12 days    Time spent: 30 min    Lamone Ferrelli S, MD [facility] Pager [phone]  If 7PM-7AM, please contact night-coverage [email or web address] Password [email or web address] 06/17/2016, 1:21 PM

## 2016-06-17 NOTE — Progress Notes (Signed)
Mr. Gary looks a little better today. He still has the shortness of breath. He is not hurting. He is not coughing as much. There is no bleeding. He has no hemoptysis.  He has had no abdominal pain. He has had no diarrhea. He has had no constipation.  There's not been any headache.  Overall, chest wall pain seems to be fairly well controlled.  On physical exam, his vital signs are all stable. His blood pressure is 137/67. His temperature is 90.8. His lungs show a little bit better breath sounds on the right side. He still has decreased breath sounds on the left side with some wheezing. Cardiac exam tachycardic but regular. Abdomen is soft. Bowel sounds are slightly decreased. He has no fluid wave. Extremities shows no clubbing, cyanosis or edema.  His labs today shows potassium to be 5.2. Creatinine is 3. BUN is 82.  He will start his radiation therapy tomorrow for his locally advanced/metastatic non-small cell lung cancer. I'll talk to the lab about rending PD-L1 test. Maybe they can do this. He is still on Plavix. He cannot come off Plavix O he cannot do any other biopsies. I cannot do a liquid biopsy since he is in the hospital.  I'm just glad that he looks a little better and feels a little better.  Obviously, a lot of his improvement is also the outstanding care that he is getting from staff up on [address].  1 Adelia Baptista 5:6-7

## 2016-06-17 NOTE — Progress Notes (Signed)
Patient refuses CPAP, stating the hospital mask is uncomfortable.  Patient did attempt home unit at hospital, but had difficulty with this as well.  At this time patient wishes to refuse CPAP QHS.

## 2016-06-18 ENCOUNTER — Encounter: Payer: Medicare Other | Admitting: Cardiovascular Disease

## 2016-06-18 ENCOUNTER — Ambulatory Visit: Admit: 2016-06-18 | Payer: Medicare Other | Admitting: Radiation Oncology

## 2016-06-18 ENCOUNTER — Inpatient Hospital Stay (HOSPITAL_COMMUNITY): Payer: Medicare Other

## 2016-06-18 ENCOUNTER — Ambulatory Visit
Admit: 2016-06-18 | Discharge: 2016-06-18 | Disposition: A | Discharge status: Home / Self Care | Payer: Medicare Other | Attending: Radiation Oncology | Admitting: Radiation Oncology

## 2016-06-18 LAB — CBC WITH DIFFERENTIAL/PLATELET
BASOS ABS: 0.1 10*3/uL (ref 0.0–0.1)
BASOS PCT: 0 %
EOS ABS: 0.2 10*3/uL (ref 0.0–0.7)
Eosinophils Relative: 2 %
HCT: 26.8 % — ABNORMAL LOW (ref 39.0–52.0)
HEMOGLOBIN: 8.7 g/dL — AB (ref 13.0–17.0)
LYMPHS ABS: 1 10*3/uL (ref 0.7–4.0)
Lymphocytes Relative: 7 %
MCH: 31 pg (ref 26.0–34.0)
MCHC: 32.5 g/dL (ref 30.0–36.0)
MCV: 95.4 fL (ref 78.0–100.0)
Monocytes Absolute: 1.1 10*3/uL — ABNORMAL HIGH (ref 0.1–1.0)
Monocytes Relative: 8 %
NEUTROS PCT: 83 %
Neutro Abs: 11.8 10*3/uL — ABNORMAL HIGH (ref 1.7–7.7)
Platelets: 428 10*3/uL — ABNORMAL HIGH (ref 150–400)
RBC: 2.81 MIL/uL — AB (ref 4.22–5.81)
RDW: 14.8 % (ref 11.5–15.5)
WBC: 14.1 10*3/uL — AB (ref 4.0–10.5)

## 2016-06-18 LAB — COMPREHENSIVE METABOLIC PANEL
ALBUMIN: 1.7 g/dL — AB (ref 3.5–5.0)
ALK PHOS: 82 U/L (ref 38–126)
ALT: 30 U/L (ref 17–63)
AST: 43 U/L — AB (ref 15–41)
Anion gap: 13 (ref 5–15)
BUN: 86 mg/dL — ABNORMAL HIGH (ref 6–20)
CALCIUM: 8.5 mg/dL — AB (ref 8.9–10.3)
CO2: 25 mmol/L (ref 22–32)
CREATININE: 3.02 mg/dL — AB (ref 0.61–1.24)
Chloride: 96 mmol/L — ABNORMAL LOW (ref 101–111)
GFR calc Af Amer: 20 mL/min — ABNORMAL LOW (ref >60)
GFR calc non Af Amer: 18 mL/min — ABNORMAL LOW (ref >60)
GLUCOSE: 171 mg/dL — AB (ref 65–99)
Potassium: 4.5 mmol/L (ref 3.5–5.1)
SODIUM: 134 mmol/L — AB (ref 135–145)
Total Bilirubin: 0.6 mg/dL (ref 0.3–1.2)
Total Protein: 6.1 g/dL — ABNORMAL LOW (ref 6.5–8.1)

## 2016-06-18 LAB — GLUCOSE, CAPILLARY
GLUCOSE-CAPILLARY: 171 mg/dL — AB (ref 65–99)
GLUCOSE-CAPILLARY: 141 mg/dL — AB (ref 65–99)
Glucose-Capillary: 165 mg/dL — ABNORMAL HIGH (ref 65–99)
Glucose-Capillary: 130 mg/dL — ABNORMAL HIGH (ref 65–99)

## 2016-06-18 LAB — PREALBUMIN: Prealbumin: 7.7 mg/dL — ABNORMAL LOW (ref 18–38)

## 2016-06-18 MED ORDER — BIOTENE DRY MOUTH MT LIQD
15.0000 mL | OROMUCOSAL | Status: DC
  Administered 2016-06-18 – 2016-06-20 (×11): 15 mL via OROMUCOSAL

## 2016-06-18 NOTE — Progress Notes (Signed)
PT Cancellation Note  Patient Details Name: Alan Ford MRN: [id number] DOB: May 16, 1930   Cancelled Treatment:    Reason Eval/Treat Not Completed: Other (comment) (going to XRT)   [facility] 06/18/2016, 11:41 AM

## 2016-06-18 NOTE — Progress Notes (Signed)
Mr. Minus did pretty well yesterday. He seems pretty comfortable. He does seem to be on a more oxygen.  His appetite is okay. He does have a little bit of soreness in his mouth, he says from his dentures. We'll try him on some mouth rinse for this.  He's not complaining of any pain outside of that with the left chest wall.  He starts his radiation therapy today for palliation.  He had labs done. Everything looks okay. His hemoglobin was 8.7. Platelet count 428. White cell count 14. His creatinine is 3 area and BUN is 86. Potassium is 4.5.  He's had no rashes. He's had no change in bowel or bladder habits. Says she's not had a bowel movement for about a week.  He's not noted any nausea or vomiting.  I think he still is getting out of bed to a chair with assistance.  On his physical exam, his vital signs show temperature of 97. Pulse 102. Blood pressure 120/80. Hent exam shows some old dry blood in his pharynx. No mucositis is noted. No adenopathy noted in the neck. Lungs show wheezing over on the right side. Left side has decreased air movement with some wheezing. Cardiac exam tachycardic but regular. He has no murmurs. Abdomen is soft. There is no fluid wave. There is no palpable liver or spleen tip.  Again, he'll start his radiation today. I would not expect any marked improvement in his pulmonary status for another week area  His hemoglobin is on the lower side but I would not transfuse him at this point.  Overall, he still appears to be somewhat comfortable.  As always, we had a good prayer session.  Pete E.  Ephesians 4:2

## 2016-06-18 NOTE — Progress Notes (Signed)
PROGRESS NOTE  JOSHAU CODE  VEH:[id number] DOB: 1930-03-06 DOA: 06/19/2016 PCP: Mathews Argyle, MD  Brief Narrative:   82yo male former smoker with diabetes mellitus type 2, chronic dCHF, atrial fibrillation, CKD stage 4, OSA on CPAP, ILD (NSIP) and COPD on 3L home O2 followed by Dr. Lamonte Sakai. He was recently admitted from 05/14/2016-05/22/2016 for NSTEMI and had severe three vessel disease.  He was deemed not a candidate for CABG due to his ILD and severe COPD so he had a DES placed to his RCA on 05/17/2016. He returned 6/6 with c/o progressive dyspnea, orthopnea, cough. CXR revealed an increase in his known left pleural effusion and worsening pulmonary edema. He was started on bipap and admitted to SDU where PCCM was consulted. He underwent LEFT thoracentesis with 1.5 L removed 6/7.  Fluid was exudate by LDH only (marked pleural elevation to 1217, serum 188) and cytology demonstrated undifferentiated malignant cells.  CT chest suggests a primary lung mass and stage 4 cancer given malignant pleural effusion.  Oncology has been consulted.  Due to his deconditioning and malnutrition, he is not a good candidate for chemotherapy.  He will undergo palliative XRT to the left chest and plan to discharge to SNF until his treatments are complete.  Plan for home with hospice care (or residential hospice depending on progression and patient preference) after completion of his XRT.    Assessment & Plan:   Principal Problem:   Acute on chronic respiratory failure with hypoxia (HCC) Active Problems:   Chronic diastolic heart failure (HCC)   Dyspnea   COPD (chronic obstructive pulmonary disease) (HCC)   ILD (interstitial lung disease) (HCC)   Essential hypertension   CHF (congestive heart failure) (HCC)   Coronary artery disease involving native coronary artery of native heart without angina pectoris   Lung mass   Pleural effusion on left   Acute on chronic renal failure (HCC)   Lymphadenopathy   DNR  (do not resuscitate)   Palliative care encounter   S/P thoracentesis   Metastatic lung carcinoma (HCC)   Acute on chronic respiratory failure with hypoxia ([place]) likely secondary to malignant pleural effusion, undifferentiated, likely lung primary cancer, and possible aspiration pneumonia - appreciate pulmonology and oncology assistance -  S/p LEFT thoracentesis with improvement after removal of 1.5L of fluid -  continue lasix at current dose -  If he appears to reaccumulate fluid quickly, consider pleurx catheter.  Could potentially be done ON plavix according to radiologist -  Bone scan negative for bony mets -  MRI brain ordered by oncology is pending -  XRT planned for Monday  -  Repeat chest x-ray shows some extensive patchy consolidation throughout the left lung, and some mild patchy opacity in the mid to lower right lung. -   Continue unasyn   Coronary artery disease, severe 3V disease, not a candidate for CABG due to COPD  Chest pain free -  Had DES placed to the RCA on 05/17/2016 -  Cardiology recommends NOT suspending aspirin or plavix at this time due to high risk of in-stent thrombosis -  Continue BB -  Not on statin due to age and frailty -  Continue ranolazine and imdur  Chronic diastolic and systolic heart failure ([place]), EF of 30-35 percent - Pt on isordil and B blocker - no ACEI/ARB or spironolactone due to CKD - diuretics as above  Paroxismal atrial fibrillation.  -  CHADs2vasc = 5    -  No anticoagulation due to  possible upcoming procedures/nearing end of life -  Tele:  SR, okay to d/c telemetry  Aortic atherosclerosis with 5.4 cm x 3.9 cm AAA, continue aspirin.  No statin due to age and comorbidities  COPD (chronic obstructive pulmonary disease) (HCC)/ ILD (interstitial lung disease) (HCC) -  High perioperative risk, not a good surgical candidate  Essential hypertension - continue current antihypertensive regimen  CKD stage IV, creatinine is worse today,  creatinine 2.92. Will follow BMP in a.m.                                      Healing rib fractures -  PT recommending SNF -  Continue morphine per palliative care  Hyponatremia due to pleural effusion and lung CA, improved today's sodium 132.  Hyperkalemia- we will give him  Another dose of Kayexalate  As potassium is still elevated, and check BMP in a.m.  Anemia of chronic disease and renal disease -  No need for blood transfusion today.  DVT prophylaxis:  Heparin  Code Status:  DNR  Family Communication:  No family at bedside  Disposition Plan:   Plan to SNF for two weeks for 10 treatments of XRT, then transition to home with hospice or to residential hospice depending on progression.     Consultants:   Pulmonology  Procedures:  L thora 6/7:  1.5L removed  Antimicrobials:   Unasyn 6/16 >    Subjective: Patient seen and examined, transferred from [facility] for radiation treatment scheduled on Monday. Feels better today. Plan is for radiation treatment today.  Objective: Filed Vitals:   06/17/16 1648 06/17/16 2007 06/18/16 0604 06/18/16 1329  BP: 119/70 123/69 128/80 132/75  Pulse: 83 92 102 101  Temp:  97.7 F (36.5 C) 97.3 F (36.3 C) 98.1 F (36.7 C)  TempSrc:  Oral Oral Axillary  Resp:  '18 20 18  '$ Height:      Weight:      SpO2:  94% 88% 91%    Intake/Output Summary (Last 24 hours) at 06/18/16 1602 Last data filed at 06/18/16 0830  Gross per 24 hour  Intake    540 ml  Output    700 ml  Net   -160 ml   Filed Weights   06/15/16 0600 06/16/16 0553 06/17/16 0500  Weight: 78.019 kg (172 lb) 76.975 kg (169 lb 11.2 oz) 76.204 kg (168 lb)    Examination:  Physical Exam: Eyes: No icterus, extraocular muscles intact  Mouth: Oral mucosa is moist, no lesions on palate,  Neck: Supple, no deformities, masses, or tenderness Lungs: Decreased breath sounds bilaterally Heart: Regular rate and rhythm, S1 and S2 normal, no murmurs, rubs  auscultated Abdomen: BS normoactive,soft,nondistended,non-tender to palpation,no organomegaly Extremities: No pretibial edema, no erythema, no cyanosis, no clubbing Neuro : Alert and oriented to time, place and person, No focal deficits Skin: No rashes seen on exam      Data Reviewed: I have personally reviewed following labs and imaging studies  CBC:  Recent Labs Lab 06/12/16 0045 06/15/16 0401 06/18/16 0357  WBC 13.9* 15.3* 14.1*  NEUTROABS  --   --  11.8*  HGB 8.8* 9.0* 8.7*  HCT 27.6* 28.9* 26.8*  MCV 93.6 94.1 95.4  PLT 307 369 833*   Basic Metabolic Panel:  Recent Labs Lab 06/14/16 0917 06/15/16 0401 06/16/16 0355 06/17/16 0344 06/18/16 0357  NA 132* 130* 132* 134* 134*  K 5.0  4.8 5.3* 5.2* 4.5  CL 96* 97* 98* 99* 96*  CO2 21* '22 23 23 25  '$ GLUCOSE 206* 116* 154* 142* 171*  BUN 53* 63* 75* 82* 86*  CREATININE 2.54* 2.51* 2.92* 3.01* 3.02*  CALCIUM 9.1 8.8* 8.7* 8.6* 8.5*   GFR: Estimated Creatinine Clearance: 19.1 mL/min (by C-G formula based on Cr of 3.02). Liver Function Tests:  Recent Labs Lab 06/14/16 0917 06/18/16 0357  AST 40 43*  ALT 33 30  ALKPHOS 79 82  BILITOT 0.4 0.6  PROT 6.2* 6.1*  ALBUMIN 1.7* 1.7*   BNP (last 3 results)  Recent Labs  05/14/16 1012  PROBNP 1454.0*   HbA1C: No results for input(s): HGBA1C in the last 72 hours. CBG:  Recent Labs Lab 06/17/16 1212 06/17/16 1801 06/17/16 2202 06/18/16 0732 06/18/16 1245  GLUCAP 242* 157* 199* 171* 165*   Urine analysis:    Component Value Date/Time   COLORURINE STRAW* 05/15/2016 1927   APPEARANCEUR CLEAR 05/15/2016 1927   LABSPEC 1.016 05/15/2016 1927   PHURINE 6.5 05/15/2016 1927   GLUCOSEU 250* 05/15/2016 1927   HGBUR SMALL* 05/15/2016 1927   BILIRUBINUR NEGATIVE 05/15/2016 [place] NEGATIVE 05/15/2016 1927   PROTEINUR 100* 05/15/2016 1927   UROBILINOGEN 0.2 10/06/2009 0923   NITRITE NEGATIVE 05/15/2016 1927   LEUKOCYTESUR NEGATIVE 05/15/2016 1927    Sepsis Labs: '@LABRCNTIP'$ (procalcitonin:4,lacticidven:4)  ) No results found for this or any previous visit (from the past 240 hour(s)).    Radiology Studies: Dg Chest 2 View  06/18/2016  CLINICAL DATA:  Dyspnea. EXAM: CHEST  2 VIEW COMPARISON:  06/16/2016 chest radiograph. FINDINGS: Stable cardiomediastinal silhouette with normal heart size. No pneumothorax. No right pleural effusion. Stable moderate left pleural effusion. Patchy consolidation throughout the left lung, most prominent at the left lung base, not appreciably changed. Underlying basilar predominant reticular opacities in the lungs. IMPRESSION: 1. No appreciable change in patchy consolidation throughout the left lung, most prominent at the left lung base, suspicious for a combination of left parahilar lung mass, atelectasis and/ or pneumonia as seen on 06/14/2016 chest CT. 2. Stable moderate left pleural effusion. 3. Stable underlying basilar predominant reticular interstitial opacities. Electronically Signed   By: Ilona Sorrel M.D.   On: 06/18/2016 09:56     Scheduled Meds: . ampicillin-sulbactam (UNASYN) IV  1.5 g Intravenous Q12H  . antiseptic oral rinse  15 mL Mouth Rinse Q4H  . aspirin EC  81 mg Oral QHS  . bisacodyl  20 mg Rectal Once  . budesonide (PULMICORT) nebulizer solution  0.5 mg Nebulization BID  . carvedilol  3.125 mg Oral BID WC  . clopidogrel  75 mg Oral Daily  . heparin subcutaneous  5,000 Units Subcutaneous Q8H  . insulin aspart  0-9 Units Subcutaneous TID WC & HS  . ipratropium  0.5 mg Nebulization TID  . isosorbide dinitrate  10 mg Oral BID  . levalbuterol  0.63 mg Nebulization TID  . polyethylene glycol  17 g Oral BID  . ranolazine  500 mg Oral BID  . senna  2 tablet Oral QHS  . sodium chloride flush  3 mL Intravenous Q12H  . sodium chloride flush  3 mL Intravenous Q12H   Continuous Infusions:    LOS: 13 days    Time spent: 30 min    Cynai Skeens S, MD [facility] Pager  [phone]  If 7PM-7AM, please contact night-coverage [email or web address] Password [email or web address] 06/18/2016, 4:02 PM

## 2016-06-18 NOTE — Progress Notes (Signed)
Pt refused CPAP QHS.  RT will continue to monitor as needed. 

## 2016-06-18 NOTE — Progress Notes (Signed)
Patient returned back to unit after attempting radiation.  Per transport patient was unable to tolerate treatment and refused due to pain.  RN had asked patient if he was in pain immediately prior to leaving the unit for radiation to which he said no, also asked when he returned to his room from radiation was he hurting and he said no.  Apparently the pain was due to positioning requirements.

## 2016-06-19 ENCOUNTER — Ambulatory Visit: Payer: Medicare Other

## 2016-06-19 ENCOUNTER — Ambulatory Visit: Payer: Medicare Other | Admitting: Radiation Oncology

## 2016-06-19 ENCOUNTER — Encounter (HOSPITAL_COMMUNITY): Payer: Self-pay | Admitting: General Surgery

## 2016-06-19 LAB — GLUCOSE, CAPILLARY
GLUCOSE-CAPILLARY: 149 mg/dL — AB (ref 65–99)
GLUCOSE-CAPILLARY: 181 mg/dL — AB (ref 65–99)
Glucose-Capillary: 202 mg/dL — ABNORMAL HIGH (ref 65–99)
Glucose-Capillary: 181 mg/dL — ABNORMAL HIGH (ref 65–99)

## 2016-06-19 LAB — BASIC METABOLIC PANEL
ANION GAP: 13 (ref 5–15)
BUN: 98 mg/dL — ABNORMAL HIGH (ref 6–20)
CALCIUM: 8.8 mg/dL — AB (ref 8.9–10.3)
CO2: 24 mmol/L (ref 22–32)
CREATININE: 3.05 mg/dL — AB (ref 0.61–1.24)
Chloride: 99 mmol/L — ABNORMAL LOW (ref 101–111)
GFR, EST AFRICAN AMERICAN: 20 mL/min — AB (ref >60)
GFR, EST NON AFRICAN AMERICAN: 17 mL/min — AB (ref >60)
GLUCOSE: 154 mg/dL — AB (ref 65–99)
Potassium: 4.8 mmol/L (ref 3.5–5.1)
Sodium: 136 mmol/L (ref 135–145)

## 2016-06-19 MED ORDER — SALINE SPRAY 0.65 % NA SOLN
1.0000 | NASAL | Status: DC | PRN
  Filled 2016-06-19: qty 44

## 2016-06-19 MED ORDER — FUROSEMIDE 10 MG/ML IJ SOLN
40.0000 mg | Freq: Once | INTRAMUSCULAR | Status: AC
  Administered 2016-06-19: 40 mg via INTRAVENOUS
  Filled 2016-06-19: qty 4

## 2016-06-19 MED ORDER — HEPARIN SODIUM (PORCINE) 5000 UNIT/ML IJ SOLN: 5000.0000 [IU] | Freq: Three times a day (TID) | INTRAMUSCULAR | Status: DC

## 2016-06-19 MED ORDER — CEFAZOLIN SODIUM-DEXTROSE 2-4 GM/100ML-% IV SOLN
2.0000 g | INTRAVENOUS | Status: DC
  Filled 2016-06-19: qty 100

## 2016-06-19 NOTE — Progress Notes (Signed)
PT Cancellation/discharge from PT Note  Patient Details Name: Alan Ford MRN: [id number] DOB: 1930-01-07   Cancelled Treatment:    Reason Eval/Treat Not Completed: Note plan is now for d/c to [facility]. Will sign off.    Weston Anna, MPT Pager: [phone]

## 2016-06-19 NOTE — Progress Notes (Signed)
The events of yesterday have been noted. He went down for radiation. Apparently, he could not lift his arms up above his head. He is having discomfort.  He clearly is declining. He is weaker. He just is not able to do much. He is not eating much. He is really not able to get out of bed.  I spoke to his wife on the phone last night. I gave her my take on the situation. I told her that the only way we could help him would be with radiation so that he may be able to breathe better and maybe not have as much chest discomfort.  I think that if he cannot tolerate radiation today, then I would not pursue any further radiation.  He is clearly not able to go home. His wife cannot take care of him. I am not sure if he is a candidate for any type of skilled nursing. He really is not able to do any kind of rehabilitation.  I think the best option would be [facility]. However, since he is getting radiation, this is not possible. If he cannot tolerate radiation, then I think [place] would be a great facility for him. It would help his wife out quite a bit.  It looks like he really needs quite a bit of oxygen right now. When I listen to him on physical exam, I cannot hear much air movement over on the left side.  Our ultimate goal here is comfort. I think that radiation might be able to help with this. However, if he is not able to handle radiation, then I would clearly get him to [facility]. I would think that if he has an obstructed left lung, and his prognosis is easily less than 1 month.  I know that he is trying. His body is just so weak.   I do appreciate all the great care that he is getting from everybody up on [place]  2 Timothy 1:7

## 2016-06-19 NOTE — Progress Notes (Signed)
PROGRESS NOTE  Alan Ford  HRC:[id number] DOB: 1930/01/25 DOA: 06/10/2016 PCP: Mathews Argyle, MD  Brief Narrative:   82yo male former smoker with diabetes mellitus type 2, chronic dCHF, atrial fibrillation, CKD stage 4, OSA on CPAP, ILD (NSIP) and COPD on 3L home O2 followed by Dr. Lamonte Sakai. He was recently admitted from 05/14/2016-05/22/2016 for NSTEMI and had severe three vessel disease.  He was deemed not a candidate for CABG due to his ILD and severe COPD so he had a DES placed to his RCA on 05/17/2016. He returned 6/6 with c/o progressive dyspnea, orthopnea, cough. CXR revealed an increase in his known left pleural effusion and worsening pulmonary edema. He was started on bipap and admitted to SDU where PCCM was consulted. He underwent LEFT thoracentesis with 1.5 L removed 6/7.  Fluid was exudate by LDH only (marked pleural elevation to 1217, serum 188) and cytology demonstrated undifferentiated malignant cells.  CT chest suggests a primary lung mass and stage 4 cancer given malignant pleural effusion.  Oncology has been consulted.  Due to his deconditioning and malnutrition, he is not a good candidate for chemotherapy.  He will undergo palliative XRT to the left chest and plan to discharge to SNF until his treatments are complete.  Plan for home with hospice care (or residential hospice depending on progression and patient preference) after completion of his XRT.    Assessment & Plan:   Principal Problem:   Acute on chronic respiratory failure with hypoxia (HCC) Active Problems:   Chronic diastolic heart failure (HCC)   Dyspnea   COPD (chronic obstructive pulmonary disease) (HCC)   ILD (interstitial lung disease) (HCC)   Essential hypertension   CHF (congestive heart failure) (HCC)   Coronary artery disease involving native coronary artery of native heart without angina pectoris   Lung mass   Pleural effusion on left   Acute on chronic renal failure (HCC)   Lymphadenopathy   DNR  (do not resuscitate)   Palliative care encounter   S/P thoracentesis   Metastatic lung carcinoma (HCC)   Acute on chronic respiratory failure with hypoxia (HCC) likely secondary to malignant pleural effusion, undifferentiated, likely lung primary cancer, and possible aspiration pneumonia - appreciate pulmonology and oncology assistance -  S/p LEFT thoracentesis with improvement after removal of 1.5L of fluid -  continue lasix at current dose -  Bone scan negative for bony mets -  Repeat chest x-ray shows some extensive patchy consolidation throughout the left lung, and some mild patchy opacity in the mid to lower right lung. -   Continue unasyn till discharge  - could not tolerate radiation traetments due to not tolerate positioning for treatment due to pain in his left chest from his disease and effusion - Will order PleurX catheter for recurrent left pleural effusion, called and discussed with Dr [name].  Coronary artery disease, severe 3V disease, not a candidate for CABG due to COPD  Chest pain free -  Had DES placed to the RCA on 05/17/2016 -  Cardiology recommends NOT suspending aspirin or plavix at this time due to high risk of in-stent thrombosis -  Continue BB -  Not on statin due to age and frailty -  Continue ranolazine and imdur  Chronic diastolic and systolic heart failure ([place]), EF of 30-35 percent - Pt on isordil and B blocker - no ACEI/ARB or spironolactone due to CKD - diuretics as above  Paroxismal atrial fibrillation.  -  CHADs2vasc = 5    -  No anticoagulation due to possible upcoming procedures/nearing end of life -  Tele:  SR, okay to d/c telemetry  Aortic atherosclerosis with 5.4 cm x 3.9 cm AAA, continue aspirin.  No statin due to age and comorbidities  COPD (chronic obstructive pulmonary disease) (HCC)/ ILD (interstitial lung disease) (HCC) -  High perioperative risk, not a good surgical candidate  Essential hypertension - continue current  antihypertensive regimen  CKD stage IV, creatinine is 3.05                                  Healing rib fractures -  PT recommending SNF -  Continue morphine per palliative care  Anemia of chronic disease and renal disease -  No need for blood transfusion today.  DVT prophylaxis:  Heparin  Code Status:  DNR  Family Communication:  Discussed with wife at bedside  Disposition Plan:   Plan to go to residential hospice after [facility] catheter placement   Consultants:   Pulmonology  Procedures:  L thora 6/7:  1.5L removed  Antimicrobials:   Unasyn 6/16 >    Subjective: Patient seen and examined, transferred from [facility] for radiation treatment, he could not tolerate positioning of treatment due to left sided chest pain. He has refused further radiation treatments.  Objective: Filed Vitals:   06/19/16 0040 06/19/16 0517 06/19/16 0830 06/19/16 1325  BP:  123/65  101/66  Pulse:  107  83  Temp:  98.2 F (36.8 C)  98.3 F (36.8 C)  TempSrc:  Oral  Oral  Resp:  20  16  Height:      Weight:  73.029 kg (161 lb)    SpO2: 89% 83% 84% 92%    Intake/Output Summary (Last 24 hours) at 06/19/16 1357 Last data filed at 06/19/16 1014  Gross per 24 hour  Intake   1548 ml  Output    825 ml  Net    723 ml   Filed Weights   06/16/16 0553 06/17/16 0500 06/19/16 0517  Weight: 76.975 kg (169 lb 11.2 oz) 76.204 kg (168 lb) 73.029 kg (161 lb)    Examination:  Physical Exam: Eyes: No icterus, extraocular muscles intact  Mouth: Oral mucosa is moist, no lesions on palate,  Neck: Supple, no deformities, masses, or tenderness Lungs: Decreased breath sounds bilaterally Heart: Regular rate and rhythm, S1 and S2 normal, no murmurs, rubs auscultated Abdomen: BS normoactive,soft,nondistended,non-tender to palpation,no organomegaly Extremities: No pretibial edema, no erythema, no cyanosis, no clubbing Neuro : Alert and oriented to time, place and person, No focal  deficits Skin: No rashes seen on exam      Data Reviewed: I have personally reviewed following labs and imaging studies  CBC:  Recent Labs Lab 06/15/16 0401 06/18/16 0357  WBC 15.3* 14.1*  NEUTROABS  --  11.8*  HGB 9.0* 8.7*  HCT 28.9* 26.8*  MCV 94.1 95.4  PLT 369 831*   Basic Metabolic Panel:  Recent Labs Lab 06/15/16 0401 06/16/16 0355 06/17/16 0344 06/18/16 0357 06/19/16 0356  NA 130* 132* 134* 134* 136  K 4.8 5.3* 5.2* 4.5 4.8  CL 97* 98* 99* 96* 99*  CO2 '22 23 23 25 24  '$ GLUCOSE 116* 154* 142* 171* 154*  BUN 63* 75* 82* 86* 98*  CREATININE 2.51* 2.92* 3.01* 3.02* 3.05*  CALCIUM 8.8* 8.7* 8.6* 8.5* 8.8*   GFR: Estimated Creatinine Clearance: 18.6 mL/min (by C-G formula based on Cr of  3.05). Liver Function Tests:  Recent Labs Lab 06/14/16 0917 06/18/16 0357  AST 40 43*  ALT 33 30  ALKPHOS 79 82  BILITOT 0.4 0.6  PROT 6.2* 6.1*  ALBUMIN 1.7* 1.7*   BNP (last 3 results)  Recent Labs  05/14/16 1012  PROBNP 1454.0*   HbA1C: No results for input(s): HGBA1C in the last 72 hours. CBG:  Recent Labs Lab 06/18/16 1245 06/18/16 1658 06/18/16 2120 06/19/16 0744 06/19/16 1219  GLUCAP 165* 130* 141* 202* 181*   Urine analysis:    Component Value Date/Time   COLORURINE STRAW* 05/15/2016 1927   APPEARANCEUR CLEAR 05/15/2016 1927   LABSPEC 1.016 05/15/2016 1927   PHURINE 6.5 05/15/2016 1927   GLUCOSEU 250* 05/15/2016 1927   HGBUR SMALL* 05/15/2016 1927   BILIRUBINUR NEGATIVE 05/15/2016 [place] NEGATIVE 05/15/2016 1927   PROTEINUR 100* 05/15/2016 1927   UROBILINOGEN 0.2 10/06/2009 0923   NITRITE NEGATIVE 05/15/2016 1927   LEUKOCYTESUR NEGATIVE 05/15/2016 1927   Sepsis Labs: '@LABRCNTIP'$ (procalcitonin:4,lacticidven:4)  ) No results found for this or any previous visit (from the past 240 hour(s)).    Radiology Studies: Dg Chest 2 View  06/18/2016  CLINICAL DATA:  Dyspnea. EXAM: CHEST  2 VIEW COMPARISON:  06/16/2016 chest  radiograph. FINDINGS: Stable cardiomediastinal silhouette with normal heart size. No pneumothorax. No right pleural effusion. Stable moderate left pleural effusion. Patchy consolidation throughout the left lung, most prominent at the left lung base, not appreciably changed. Underlying basilar predominant reticular opacities in the lungs. IMPRESSION: 1. No appreciable change in patchy consolidation throughout the left lung, most prominent at the left lung base, suspicious for a combination of left parahilar lung mass, atelectasis and/ or pneumonia as seen on 06/14/2016 chest CT. 2. Stable moderate left pleural effusion. 3. Stable underlying basilar predominant reticular interstitial opacities. Electronically Signed   By: Ilona Sorrel M.D.   On: 06/18/2016 09:56     Scheduled Meds: . ampicillin-sulbactam (UNASYN) IV  1.5 g Intravenous Q12H  . antiseptic oral rinse  15 mL Mouth Rinse Q4H  . aspirin EC  81 mg Oral QHS  . bisacodyl  20 mg Rectal Once  . budesonide (PULMICORT) nebulizer solution  0.5 mg Nebulization BID  . carvedilol  3.125 mg Oral BID WC  . clopidogrel  75 mg Oral Daily  . heparin subcutaneous  5,000 Units Subcutaneous Q8H  . insulin aspart  0-9 Units Subcutaneous TID WC & HS  . ipratropium  0.5 mg Nebulization TID  . isosorbide dinitrate  10 mg Oral BID  . levalbuterol  0.63 mg Nebulization TID  . polyethylene glycol  17 g Oral BID  . ranolazine  500 mg Oral BID  . senna  2 tablet Oral QHS  . sodium chloride flush  3 mL Intravenous Q12H  . sodium chloride flush  3 mL Intravenous Q12H   Continuous Infusions:    LOS: 14 days    Time spent: 30 min    Maxwell Martorano S, MD [facility] Pager [phone]  If 7PM-7AM, please contact night-coverage [email or web address] Password [email or web address] 06/19/2016, 1:57 PM

## 2016-06-19 NOTE — Consult Note (Signed)
Chief Complaint: Malignant left pleural effusion  Referring Physician: Dr. Eleonore Chiquito  Supervising Physician: Corrie Mckusick  Patient Status: In-pt   HPI: Alan Ford is an 82 y.o. male with multiple medical problems who was admitted in mid May for an NSTEMI.  He was not a candidate for CABG at this time. Therefore, cardiology place drug-eluting stents and the patient was placed on Plavix. He was admitted on June 6 with progressive dyspnea and a chest x-ray revealed an increase size in his known left pleural effusion. Pulmonary saw the patient and had this fluid removed. He had 1.5 L removed. This fluid was found to be positive for malignant cells suggesting a possible primary of lung cancer. The patient has been evaluated by oncology and he was supposed to start radiation yesterday. However, the patient is unable to tolerate the positioning required for radiation. He attempted this again today that was once again unable to tolerate this. His overall status has declined significantly. The patient and his wife have requested to pursue comfort care. We have been asked to evaluate the patient for placement of a Pleurx catheter to help relieve this recurrent pleural effusion and to help with his dyspnea..  Past Medical History:  Past Medical History  Diagnosis Date  . Diabetes mellitus   . High blood pressure   . High cholesterol   . CHF (congestive heart failure) ([place])   . Emphysema   . Joint pain   . AAA (abdominal aortic aneurysm) ([place])   . COPD (chronic obstructive pulmonary disease) ([place])   . Chronic kidney disease   . Irregular heart beat   . PVC (premature ventricular contraction)   . Shingles   . Chronic renal disease, stage III   . Sleep apnea     cpap  . Irregular heart beat   . CAD (coronary artery disease)   . Chronic diastolic heart failure ([place]) 07/24/2011  . Atrial fibrillation ([place])   . Aortic calcification ([facility]) 05/16/2016    Involving ascending and descending  thoracic aorta    Past Surgical History:  Past Surgical History  Procedure Laterality Date  . Penile prosthesis implant  09-2005  . Tonsillectomy    . Urethrotomy    . Cardiac catheterization N/A 05/15/2016    Procedure: Right/Left Heart Cath and Coronary Angiography;  Surgeon: Belva Crome, MD;  Location: [facility];  Service: Cardiovascular;  Laterality: N/A;  . Cardiac catheterization N/A 05/17/2016    Procedure: Coronary/Graft Atherectomy;  Surgeon: Belva Crome, MD;  Location: [facility];  Service: Cardiovascular;  Laterality: N/A;    Family History:  Family History  Problem Relation Age of Onset  . Other Father     AAA  . Heart disease Father     After age 10,   AAA  . Hypertension Father   . Hyperlipidemia Father   . Heart attack Father   . Diabetes Mother     amputation  . Heart disease Mother   . Hypertension Mother   . Hyperlipidemia Mother   . Deep vein thrombosis Mother   . Heart attack Mother   . Diabetes Sister   . Heart disease Sister     Heart Disease before age 67  . Hypertension Sister   . Hyperlipidemia Sister   . Diabetes Brother   . Heart disease Brother   . Hypertension Brother   . Hyperlipidemia Brother   . Cancer Daughter     Social History:  reports that he quit smoking about 30 years ago. His smoking use included Cigarettes. He has a 90 pack-year smoking history. He has quit using smokeless tobacco. His smokeless tobacco use included Chew. He reports that he does not drink alcohol or use illicit drugs.  Allergies: No Active Allergies  Medications:   Medication List    ASK your doctor about these medications        acetaminophen 325 MG tablet  Commonly known as:  TYLENOL  Take 2 tablets (650 mg total) by mouth every 4 (four) hours as needed for headache or mild pain.     albuterol 108 (90 Base) MCG/ACT inhaler  Commonly known as:  PROVENTIL HFA;VENTOLIN HFA  Inhale 2 puffs into the lungs every 6 (six) hours as needed  for wheezing or shortness of breath.     AMLODIPINE BESYLATE PO  Take 1 tablet by mouth at bedtime.     aspirin EC 81 MG tablet  Take 81 mg by mouth at bedtime.     carvedilol 3.125 MG tablet  Commonly known as:  COREG  Take 3.125 mg by mouth 2 (two) times daily with a meal.     Chromium Picolinate 800 MCG Tabs  Take 1,600 mcg by mouth daily.     clopidogrel 75 MG tablet  Commonly known as:  PLAVIX  Take 1 tablet (75 mg total) by mouth daily with breakfast.     Co Q-10 100 MG Caps  Take 100 mg by mouth at bedtime.     furosemide 20 MG tablet  Commonly known as:  LASIX  Take 20 mg by mouth 2 (two) times daily.     glipiZIDE 5 MG 24 hr tablet  Commonly known as:  GLUCOTROL XL  Take 1 tablet (5 mg total) by mouth daily.     glipiZIDE 10 MG 24 hr tablet  Commonly known as:  GLUCOTROL XL  Take 10 mg by mouth at bedtime.     guaiFENesin 600 MG 12 hr tablet  Commonly known as:  MUCINEX  Take 1 tablet (600 mg total) by mouth 2 (two) times daily as needed for to loosen phlegm.     HYDROcodone-acetaminophen 5-325 MG tablet  Commonly known as:  NORCO/VICODIN  Take 1 tablet by mouth daily as needed for moderate pain or severe pain.     isosorbide dinitrate 10 MG tablet  Commonly known as:  ISORDIL  Take 1 tablet (10 mg total) by mouth 2 (two) times daily.     nitroGLYCERIN 0.4 MG SL tablet  Commonly known as:  NITROSTAT  Place 1 tablet (0.4 mg total) under the tongue every 5 (five) minutes as needed for chest pain.     OCUVITE PO  Take 1 tablet by mouth every morning.     PRESCRIPTION MEDICATION  Inhale 2 puffs into the lungs every morning. Inhaler that starts with "e" or "r"     ranolazine 500 MG 12 hr tablet  Commonly known as:  RANEXA  Take 1 tablet (500 mg total) by mouth 2 (two) times daily.     simvastatin 20 MG tablet  Commonly known as:  ZOCOR  Take 20 mg by mouth at bedtime.     tamsulosin 0.4 MG Caps capsule  Commonly known as:  FLOMAX  Take 0.4 mg by  mouth at bedtime.     vitamin C 1000 MG tablet  Take 1,000 mg by mouth daily.     zinc gluconate 50 MG tablet  Take 50 mg  by mouth at bedtime.        Please HPI for pertinent positives, otherwise complete 10 system ROS negative.  Mallampati Score: MD Evaluation Airway: WNL Heart: WNL Abdomen: WNL Chest/ Lungs: WNL ASA  Classification: 3 Mallampati/Airway Score: Two  Physical Exam: BP 101/66 mmHg  Pulse 83  Temp(Src) 98.3 F (36.8 C) (Oral)  Resp 16  Ht 5' 10.5" (1.791 m)  Wt 161 lb (73.029 kg)  BMI 22.77 kg/m2  SpO2 86% Body mass index is 22.77 kg/(m^2). General: Frail, ill-appearing white male who is laying in bed in NAD HEENT: head is normocephalic, atraumatic.  Sclera are noninjected.  PERRL.  Ears and nose without any masses or lesions. Nasal cannula in place.  Mouth is pink and moist Heart: regular, rate, and rhythm.  Normal s1,s2. No obvious murmurs, gallops, or rubs noted.  Palpable radial and pedal pulses bilaterally Lungs: CTA on the right side but rhonchi noted on the left. Respiratory effort appears slightly labored using some accessory abdominal muscles. He has nasal cannula in place with 15 L of oxygen. Abd: soft, NT, ND, +BS, no masses, hernias, or organomegaly Psych: A&Ox3 but somewhat sleepy.   Labs: Results for orders placed or performed during the hospital encounter of 06/15/2016 (from the past 48 hour(s))  Glucose, capillary     Status: Abnormal   Collection Time: 06/17/16  6:01 PM  Result Value Ref Range   Glucose-Capillary 157 (H) 65 - 99 mg/dL  Glucose, capillary     Status: Abnormal   Collection Time: 06/17/16 10:02 PM  Result Value Ref Range   Glucose-Capillary 199 (H) 65 - 99 mg/dL   Comment 1 Notify RN   CBC with Differential/Platelet     Status: Abnormal   Collection Time: 06/18/16  3:57 AM  Result Value Ref Range   WBC 14.1 (H) 4.0 - 10.5 K/uL   RBC 2.81 (L) 4.22 - 5.81 MIL/uL   Hemoglobin 8.7 (L) 13.0 - 17.0 g/dL   HCT 26.8 (L) 39.0  - 52.0 %   MCV 95.4 78.0 - 100.0 fL   MCH 31.0 26.0 - 34.0 pg   MCHC 32.5 30.0 - 36.0 g/dL   RDW 14.8 11.5 - 15.5 %   Platelets 428 (H) 150 - 400 K/uL   Neutrophils Relative % 83 %   Neutro Abs 11.8 (H) 1.7 - 7.7 K/uL   Lymphocytes Relative 7 %   Lymphs Abs 1.0 0.7 - 4.0 K/uL   Monocytes Relative 8 %   Monocytes Absolute 1.1 (H) 0.1 - 1.0 K/uL   Eosinophils Relative 2 %   Eosinophils Absolute 0.2 0.0 - 0.7 K/uL   Basophils Relative 0 %   Basophils Absolute 0.1 0.0 - 0.1 K/uL  Comprehensive metabolic panel     Status: Abnormal   Collection Time: 06/18/16  3:57 AM  Result Value Ref Range   Sodium 134 (L) 135 - 145 mmol/L   Potassium 4.5 3.5 - 5.1 mmol/L   Chloride 96 (L) 101 - 111 mmol/L   CO2 25 22 - 32 mmol/L   Glucose, Bld 171 (H) 65 - 99 mg/dL   BUN 86 (H) 6 - 20 mg/dL   Creatinine, Ser 3.02 (H) 0.61 - 1.24 mg/dL   Calcium 8.5 (L) 8.9 - 10.3 mg/dL   Total Protein 6.1 (L) 6.5 - 8.1 g/dL   Albumin 1.7 (L) 3.5 - 5.0 g/dL   AST 43 (H) 15 - 41 U/L   ALT 30 17 - 63 U/L   Alkaline  Phosphatase 82 38 - 126 U/L   Total Bilirubin 0.6 0.3 - 1.2 mg/dL   GFR calc non Af Amer 18 (L) >60 mL/min   GFR calc Af Amer 20 (L) >60 mL/min    Comment: (NOTE) The eGFR has been calculated using the CKD EPI equation. This calculation has not been validated in all clinical situations. eGFR's persistently <60 mL/min signify possible Chronic Kidney Disease.    Anion gap 13 5 - 15  Prealbumin     Status: Abnormal   Collection Time: 06/18/16  3:57 AM  Result Value Ref Range   Prealbumin 7.7 (L) 18 - 38 mg/dL    Comment: Performed at [facility]  Glucose, capillary     Status: Abnormal   Collection Time: 06/18/16  7:32 AM  Result Value Ref Range   Glucose-Capillary 171 (H) 65 - 99 mg/dL  Glucose, capillary     Status: Abnormal   Collection Time: 06/18/16 12:45 PM  Result Value Ref Range   Glucose-Capillary 165 (H) 65 - 99 mg/dL  Glucose, capillary     Status: Abnormal   Collection  Time: 06/18/16  4:58 PM  Result Value Ref Range   Glucose-Capillary 130 (H) 65 - 99 mg/dL   Comment 1 Notify RN   Glucose, capillary     Status: Abnormal   Collection Time: 06/18/16  9:20 PM  Result Value Ref Range   Glucose-Capillary 141 (H) 65 - 99 mg/dL   Comment 1 Notify RN   Basic metabolic panel     Status: Abnormal   Collection Time: 06/19/16  3:56 AM  Result Value Ref Range   Sodium 136 135 - 145 mmol/L   Potassium 4.8 3.5 - 5.1 mmol/L   Chloride 99 (L) 101 - 111 mmol/L   CO2 24 22 - 32 mmol/L   Glucose, Bld 154 (H) 65 - 99 mg/dL   BUN 98 (H) 6 - 20 mg/dL   Creatinine, Ser 3.05 (H) 0.61 - 1.24 mg/dL   Calcium 8.8 (L) 8.9 - 10.3 mg/dL   GFR calc non Af Amer 17 (L) >60 mL/min   GFR calc Af Amer 20 (L) >60 mL/min    Comment: (NOTE) The eGFR has been calculated using the CKD EPI equation. This calculation has not been validated in all clinical situations. eGFR's persistently <60 mL/min signify possible Chronic Kidney Disease.    Anion gap 13 5 - 15  Glucose, capillary     Status: Abnormal   Collection Time: 06/19/16  7:44 AM  Result Value Ref Range   Glucose-Capillary 202 (H) 65 - 99 mg/dL   Comment 1 Notify RN    Comment 2 Document in Chart   Glucose, capillary     Status: Abnormal   Collection Time: 06/19/16 12:19 PM  Result Value Ref Range   Glucose-Capillary 181 (H) 65 - 99 mg/dL   Comment 1 Notify RN    Comment 2 Document in Chart     Imaging: Dg Chest 2 View  06/18/2016  CLINICAL DATA:  Dyspnea. EXAM: CHEST  2 VIEW COMPARISON:  06/16/2016 chest radiograph. FINDINGS: Stable cardiomediastinal silhouette with normal heart size. No pneumothorax. No right pleural effusion. Stable moderate left pleural effusion. Patchy consolidation throughout the left lung, most prominent at the left lung base, not appreciably changed. Underlying basilar predominant reticular opacities in the lungs. IMPRESSION: 1. No appreciable change in patchy consolidation throughout the left  lung, most prominent at the left lung base, suspicious for a combination of left parahilar  lung mass, atelectasis and/ or pneumonia as seen on 06/14/2016 chest CT. 2. Stable moderate left pleural effusion. 3. Stable underlying basilar predominant reticular interstitial opacities. Electronically Signed   By: Ilona Sorrel M.D.   On: 06/18/2016 09:56    Assessment/Plan 1. Presumed lung cancer with recurrent malignant left pleural effusion -We will plan to place a left Pleurx catheter tomorrow if able. Given the patient is on 15 L of oxygen, it is possible that we may not be able to sedate the patient for this procedure. I have also discussed with Dr. Earleen Newport as well as Dr. Kathlene Cote, who will be here tomorrow, that the patient is on Plavix. It has been agreed upon to proceed with this procedure while on Plavix. -His labs have been reviewed. I will check a PT/INR prior to the procedure. -We'll give her prophylactic antibiotic on the way to the procedure as well. -The procedure including risks and complications have been discussed with the patient. These include bleeding, infection, risk of injury to adjacent structures. The patient understands and is agreeable to proceed.  Thank you for this interesting consult.  I greatly enjoyed meeting Alan Ford and look forward to participating in their care.  A copy of this report was sent to the requesting provider on this date.  Electronically Signed: Henreitta Cea 06/19/2016, 2:37 PM   I spent a total of 30 minutes    in face to face in clinical consultation, greater than 50% of which was counseling/coordinating care for recurrent malignant left pleural effusion, request for Pleurx catheter

## 2016-06-19 NOTE — Progress Notes (Signed)
CSW assisting with d/c planning. Pt plans to go to [facility] at d/c. Plan confirmed with [facility]. [facility] has been updated. CSW will continue to follow to assist with d/c planning needs.  Werner Lean LCSW [phone]

## 2016-06-19 NOTE — Progress Notes (Signed)
Daily Progress Note   Patient Name: Alan Ford       Date: 06/19/2016 DOB: 1930-04-01  Age: 82 y.o. MRN#: [id number] Attending Physician: Oswald Hillock, MD Primary Care Physician: Mathews Argyle, MD Admit Date: 06/15/2016  Reason for Consultation/Follow-up: Establishing goals of care, Hospice Evaluation and Psychosocial/spiritual support  Subjective:  -continued conversation with patient and his wife and daughter Celine Ahr # [phone] regarding diagnosis, prognosis, GOC and disposition options  - discussed  his multiple co-morbidities and poor functional status and difficulty he had attempting radiation yesterday  -discussed anticipatory care needs, SNF vs residential hospice  -Mr Dubeau and his wife verbalize an understanding of his poor prognosis.  Mr Dorton read scripture and verbalized a sense of peace with his decision and his acceptance of his own mortality.  He wishes to move forward with a comfort approach allowing natural to take it course   Length of Stay: 14  Current Medications: Scheduled Meds:  . ampicillin-sulbactam (UNASYN) IV  1.5 g Intravenous Q12H  . antiseptic oral rinse  15 mL Mouth Rinse Q4H  . aspirin EC  81 mg Oral QHS  . bisacodyl  20 mg Rectal Once  . budesonide (PULMICORT) nebulizer solution  0.5 mg Nebulization BID  . carvedilol  3.125 mg Oral BID WC  . clopidogrel  75 mg Oral Daily  . heparin subcutaneous  5,000 Units Subcutaneous Q8H  . insulin aspart  0-9 Units Subcutaneous TID WC & HS  . ipratropium  0.5 mg Nebulization TID  . isosorbide dinitrate  10 mg Oral BID  . levalbuterol  0.63 mg Nebulization TID  . polyethylene glycol  17 g Oral BID  . ranolazine  500 mg Oral BID  . senna  2 tablet Oral QHS  . sodium chloride flush  3 mL Intravenous  Q12H  . sodium chloride flush  3 mL Intravenous Q12H    Continuous Infusions:    PRN Meds: sodium chloride, acetaminophen, guaiFENesin, levalbuterol, morphine CONCENTRATE, sodium chloride flush, zolpidem  Physical Exam  Constitutional: He appears cachectic. He appears ill.  - frail and weaker  Cardiovascular: Tachycardia present.   Pulmonary/Chest: He has decreased breath sounds in the left middle field and the left lower field.  Skin: Skin is warm and dry.  Vital Signs: BP 123/65 mmHg  Pulse 107  Temp(Src) 98.2 F (36.8 C) (Oral)  Resp 20  Ht 5' 10.5" (1.791 m)  Wt 73.029 kg (161 lb)  BMI 22.77 kg/m2  SpO2 84% SpO2: SpO2: (!) 84 % O2 Device: O2 Device: Nasal Cannula, High Flow Nasal Cannula O2 Flow Rate: O2 Flow Rate (L/min): 15 L/min  Intake/output summary:   Intake/Output Summary (Last 24 hours) at 06/19/16 0957 Last data filed at 06/19/16 0848  Gross per 24 hour  Intake   1308 ml  Output    825 ml  Net    483 ml   LBM: Last BM Date: 06/15/16 Baseline Weight: Weight: 75.751 kg (167 lb) Most recent weight: Weight: 73.029 kg (161 lb)       Palliative Assessment/Data:  30 %    Flowsheet Rows        Most Recent Value   Intake Tab    Referral Department  Hospitalist   Unit at Time of Referral  Cardiac/Telemetry Unit   Palliative Care Primary Diagnosis  Cancer   Date Notified  06/11/16   Palliative Care Type  New Palliative care   Reason for referral  Clarify Goals of Care   Date of Admission  06/08/2016   Date first seen by Palliative Care  06/12/16   # of days Palliative referral response time  1 Day(s)   # of days IP prior to Palliative referral  6   Clinical Assessment    Psychosocial & Spiritual Assessment    Palliative Care Outcomes       Patient Active Problem List   Diagnosis Date Noted  . Metastatic lung carcinoma ([place])   . S/P thoracentesis   . DNR (do not resuscitate)   . Palliative care encounter   . Lymphadenopathy   .  Pleural effusion on left 06/22/2016  . Acute on chronic renal failure ([place]) 06/08/2016  . Acute on chronic respiratory failure with hypoxia ([place]) 06/13/2016  . Lung mass 05/29/2016  . Dyslipidemia associated with type 2 diabetes mellitus ([place])   . Troponin level elevated   . Pleural effusion   . Coronary artery disease involving native coronary artery of native heart without angina pectoris   . Aortic calcification ([place]) 05/16/2016  . CHF (congestive heart failure) ([place]) 05/15/2016  . CRF (chronic renal failure) 05/15/2016  . NSTEMI (non-ST elevated myocardial infarction) ([place]) 05/14/2016  . Acute on chronic congestive heart failure ([place])   . Essential hypertension   . Simple chronic bronchitis ([place])   . MGUS (monoclonal gammopathy of unknown significance) 12/12/2015  . Obstructive sleep apnea 11/17/2014  . Chest pain 06/14/2014  . COPD (chronic obstructive pulmonary disease) ([place]) 12/14/2013  . ILD (interstitial lung disease) ([place]) 12/14/2013  . URI (upper respiratory infection) 12/14/2013  . Dyspnea 07/10/2013  . Abdominal aneurysm without mention of rupture 12/09/2012  . High blood pressure 07/24/2011  . High cholesterol 07/24/2011  . Diabetes mellitus ([place]) 07/24/2011  . Chronic diastolic heart failure ([place]) 07/24/2011    Palliative Care Assessment & Plan   Patient Profile: 82 y.o. male admitted on 06/29/2016 NSTEMI 2 weeks ago with cath/stent placed in RCA, copd on 3 liters [place] o2 at home, new lung mass diagnosis, CHF, afib comes in with progressive worsening sob and cough since he was discharged. Has been coughing which is worse while lying down and is sometimes productive. He reports worsening orthopnea and PND. Denies fevers or chills. Denies any worsening of his peripheral edema. He has intermittent  chest pain for the past 3 weeks. . He had a left pleural effusion and lung mass.   Thoracentesis, 06-06-16 for 1.5 liters, with continued re accumulation. Continued  physical and functional decline.  Increased need for oxygen, unable to successfully start radiation yesterday.    Patient faces EOL decisions and anticipatory care needs   Recommendations/Plan:   Pluerex is still being considered for symptom management   No further radiation treatments  Once discharge shift to full comfort path, hopeful for residetnail hospice.  He hopes for [employer] 5 mg po/sl every 1 hrs prn for dyspnea    Code Status:    Code Status Orders        Start     Ordered   06/12/16 1435  Do not attempt resuscitation (DNR)   Continuous    Question Answer Comment  In the event of cardiac or respiratory ARREST Do not call a "code blue"   In the event of cardiac or respiratory ARREST Do not perform Intubation, CPR, defibrillation or ACLS   In the event of cardiac or respiratory ARREST Use medication by any route, position, wound care, and other measures to relive pain and suffering. May use oxygen, suction and manual treatment of airway obstruction as needed for comfort.      06/12/16 1434    Code Status History    Date Active Date Inactive Code Status Order ID Comments User Context   06/19/2016 11:23 PM 06/12/2016  2:34 PM Full Code [id number]  Phillips Grout, MD Inpatient   05/14/2016  5:20 PM 05/22/2016  4:40 PM Full Code [id number]  Waldemar Dickens, MD ED   05/14/2016  5:20 PM 05/14/2016  5:20 PM Full Code [id number]  Waldemar Dickens, MD ED    Advance Directive Documentation        Most Recent Value   Type of Advance Directive  Healthcare Power of [place], Living will   Pre-existing out of facility DNR order (yellow form or pink MOST form)     "MOST" Form in Place?         Prognosis:  Once shift is made to comfort, days to weeks   Discharge Planning:   Possible Pluerex cath placement for comfort, then shift to full comfort.  Patient and his family are hopeful for residential hospice.  Will write for choice  Care plan was discussed with Dr  Darrick Meigs  Thank you for allowing the Palliative Medicine Team to assist in the care of this patient.   Time In: 0945 Time Out: 1045 Total Time 60 min Prolonged Time Billed  no       Greater than 50%  of this time was spent counseling and coordinating care related to the above assessment and plan.  Wadie Lessen, NP  Please contact Palliative Medicine Team phone at [phone] for questions and concerns.

## 2016-06-19 NOTE — Progress Notes (Signed)
I called the patient's wife to find out their discussion. The patient could not tolerate positioning for treatment due to pain in his left chest from his disease and effusion. He and his wife have elected to proceed with hospice care rather than radiotherapy at this time. He will discharge to [facility], and his wife appreciates our care, but states he is just not interested in radiation at this time. The staff at the [facility] was informed, we will cancel upcoming appts.    Carola Rhine, PAC

## 2016-06-19 NOTE — Care Management Note (Signed)
Case Management Note  Patient Details  Name: Alan Ford MRN: [id number] Date of Birth: 1930-01-22  Subjective/Objective:    82 yo admitted with Acute on Chronic Respiratory failure                Action/Plan: From home with spouse. Pt for Pleurex Catheter placement and then [place] at discharge.  Expected Discharge Date:                  Expected Discharge Plan:  [place]  In-House Referral:  Clinical Social Work  Discharge planning Services  CM Consult  Post Acute Care Choice:    Choice offered to:     DME Arranged:    DME Agency:     HH Arranged:    [place] Agency:     Status of Service:  Completed, signed off  If discussed at [employer], dates discussed:    Additional CommentsLynnell Catalan, RN 06/19/2016, 3:05 PM  [phone]

## 2016-06-19 NOTE — Progress Notes (Signed)
Nutrition Brief Note  Patient identified for Length of Stay of 14 days.   Chart reviewed. Pt now transitioning to comfort care.  No nutrition interventions warranted at this time.  Please consult as needed.   Clayton Bibles, MS, RD, LDN Pager: [phone] After Hours Pager: [phone]

## 2016-06-19 NOTE — Progress Notes (Signed)
Pt refused CPAP QHS.  RT will continue to monitor as needed. 

## 2016-06-20 ENCOUNTER — Inpatient Hospital Stay (HOSPITAL_COMMUNITY): Payer: Medicare Other

## 2016-06-20 ENCOUNTER — Ambulatory Visit: Payer: Medicare Other

## 2016-06-20 DIAGNOSIS — R509 Fever, unspecified: Secondary | ICD-10-CM

## 2016-06-20 DIAGNOSIS — R410 Disorientation, unspecified: Secondary | ICD-10-CM

## 2016-06-20 LAB — CBC WITH DIFFERENTIAL/PLATELET
BASOS PCT: 1 %
Basophils Absolute: 0.1 10*3/uL (ref 0.0–0.1)
EOS ABS: 0 10*3/uL (ref 0.0–0.7)
EOS PCT: 0 %
HCT: 27.3 % — ABNORMAL LOW (ref 39.0–52.0)
Hemoglobin: 8.7 g/dL — ABNORMAL LOW (ref 13.0–17.0)
Lymphocytes Relative: 7 %
Lymphs Abs: 0.9 10*3/uL (ref 0.7–4.0)
MCH: 30.7 pg (ref 26.0–34.0)
MCHC: 31.9 g/dL (ref 30.0–36.0)
MCV: 96.5 fL (ref 78.0–100.0)
MONO ABS: 0.9 10*3/uL (ref 0.1–1.0)
MONOS PCT: 7 %
NEUTROS PCT: 85 %
Neutro Abs: 11.7 10*3/uL — ABNORMAL HIGH (ref 1.7–7.7)
PLATELETS: 453 10*3/uL — AB (ref 150–400)
RBC: 2.83 MIL/uL — ABNORMAL LOW (ref 4.22–5.81)
RDW: 15.2 % (ref 11.5–15.5)
WBC: 13.7 10*3/uL — ABNORMAL HIGH (ref 4.0–10.5)

## 2016-06-20 LAB — BASIC METABOLIC PANEL
Anion gap: 13 (ref 5–15)
BUN: 76 mg/dL — AB (ref 6–20)
CALCIUM: 8.8 mg/dL — AB (ref 8.9–10.3)
CO2: 25 mmol/L (ref 22–32)
CREATININE: 3.35 mg/dL — AB (ref 0.61–1.24)
Chloride: 98 mmol/L — ABNORMAL LOW (ref 101–111)
GFR calc non Af Amer: 16 mL/min — ABNORMAL LOW (ref >60)
GFR, EST AFRICAN AMERICAN: 18 mL/min — AB (ref >60)
GLUCOSE: 183 mg/dL — AB (ref 65–99)
Potassium: 4.6 mmol/L (ref 3.5–5.1)
Sodium: 136 mmol/L (ref 135–145)

## 2016-06-20 LAB — GLUCOSE, CAPILLARY
Glucose-Capillary: 193 mg/dL — ABNORMAL HIGH (ref 65–99)
Glucose-Capillary: 181 mg/dL — ABNORMAL HIGH (ref 65–99)

## 2016-06-20 LAB — PROTIME-INR
INR: 1.19 (ref 0.00–1.49)
PROTHROMBIN TIME: 15.3 s — AB (ref 11.6–15.2)

## 2016-06-20 MED ORDER — LIDOCAINE HCL 1 % IJ SOLN
INTRAMUSCULAR | Status: AC
  Filled 2016-06-20: qty 20

## 2016-06-20 MED ORDER — IPRATROPIUM BROMIDE 0.02 % IN SOLN: 0.5000 mg | Freq: Four times a day (QID) | RESPIRATORY_TRACT | Status: DC

## 2016-06-20 MED ORDER — LIDOCAINE HCL 1 % IJ SOLN
INTRAMUSCULAR | Status: DC | PRN
  Administered 2016-06-20: 5 mL

## 2016-06-20 MED ORDER — LEVALBUTEROL HCL 0.63 MG/3ML IN NEBU: 0.6300 mg | INHALATION_SOLUTION | Freq: Four times a day (QID) | RESPIRATORY_TRACT | Status: DC

## 2016-06-20 MED ORDER — KCL IN DEXTROSE-NACL 20-5-0.45 MEQ/L-%-% IV SOLN
INTRAVENOUS | Status: DC
  Filled 2016-06-20: qty 1000

## 2016-06-20 MED ORDER — MORPHINE (PF) INJECTION FOR INHALATION 10 MG/ML: 10.0000 mg | RESPIRATORY_TRACT | Status: DC | PRN

## 2016-06-21 ENCOUNTER — Ambulatory Visit: Payer: Medicare Other

## 2016-06-22 ENCOUNTER — Encounter: Payer: Self-pay | Admitting: Radiation Oncology

## 2016-06-22 ENCOUNTER — Ambulatory Visit: Payer: Medicare Other

## 2016-06-25 ENCOUNTER — Ambulatory Visit: Payer: Medicare Other

## 2016-06-25 ENCOUNTER — Encounter: Payer: Self-pay | Admitting: Radiation Oncology

## 2016-06-26 ENCOUNTER — Ambulatory Visit: Payer: Medicare Other

## 2016-06-26 ENCOUNTER — Ambulatory Visit: Payer: Medicare Other | Admitting: Radiation Oncology

## 2016-06-27 ENCOUNTER — Ambulatory Visit: Payer: Medicare Other

## 2016-06-28 ENCOUNTER — Ambulatory Visit: Payer: Medicare Other

## 2016-06-29 ENCOUNTER — Ambulatory Visit: Payer: Medicare Other

## 2016-06-30 NOTE — Consult Note (Signed)
[facility] [employer] Received request from [place] for family interest in [facility]. Met with patient and spouse Margaretha Sheffield at bedside 06/19/16 to explain services and confirm interest. They completed paper work for transfer 2016-07-11. Confirmed Dr. [name] to serve as hospice attending with symptom management by Dr. Tomasa Hosteller. Note plan for pleurx to be placed this morning if possible. CSW [place] aware of above.   Will need DC summary faxed to [phone].  RN Please call report to [phone].  Thank you. Erling Conte, [place]

## 2016-06-30 NOTE — Progress Notes (Signed)
[name], male   DOB: 01/09/1930, 82 y.o.   MRN: [id number]  Patient brought down for left PleurX catheter placement.  Labored breathing with O2 sats in 80's.  Placed on non-rebreathing mask.  It would be unsafe to sedate him for a tunneled PleurX placement.  Decision made to perform bedside thoracentesis in IR recovery area.  See procedure note.

## 2016-06-30 NOTE — Progress Notes (Signed)
PROGRESS NOTE  Alan Ford  IOE:[id number] DOB: 1930-01-11 DOA: 06/07/2016 PCP: Mathews Argyle, MD  Brief Narrative:   82yo male former smoker with diabetes mellitus type 2, chronic dCHF, atrial fibrillation, CKD stage 4, OSA on CPAP, ILD (NSIP) and COPD on 3L home O2 followed by Dr. Lamonte Sakai. He was recently admitted from 05/14/2016-05/22/2016 for NSTEMI and had severe three vessel disease.  He was deemed not a candidate for CABG due to his ILD and severe COPD so he had a DES placed to his RCA on 05/17/2016. He returned 6/6 with c/o progressive dyspnea, orthopnea, cough. CXR revealed an increase in his known left pleural effusion and worsening pulmonary edema. He was started on bipap and admitted to SDU where PCCM was consulted. He underwent LEFT thoracentesis with 1.5 L removed 6/7.  Fluid was exudate by LDH only (marked pleural elevation to 1217, serum 188) and cytology demonstrated undifferentiated malignant cells.  CT chest suggests a primary lung mass and stage 4 cancer given malignant pleural effusion.  Oncology has been consulted.  Due to his deconditioning and malnutrition, he is not a good candidate for chemotherapy.  He will undergo palliative XRT to the left chest and plan to discharge to SNF until his treatments are complete.  Plan for home with hospice care (or residential hospice depending on progression and patient preference) after completion of his XRT.    Assessment & Plan:   Principal Problem:   Acute on chronic respiratory failure with hypoxia (HCC) Active Problems:   Chronic diastolic heart failure (HCC)   Dyspnea   COPD (chronic obstructive pulmonary disease) (HCC)   ILD (interstitial lung disease) (HCC)   Essential hypertension   CHF (congestive heart failure) (HCC)   Coronary artery disease involving native coronary artery of native heart without angina pectoris   Lung mass   Pleural effusion on left   Acute on chronic renal failure (HCC)   Lymphadenopathy   DNR  (do not resuscitate)   Palliative care encounter   S/P thoracentesis   Metastatic lung carcinoma (HCC)   Acute on chronic respiratory failure with hypoxia (HCC) likely secondary to malignant pleural effusion, undifferentiated, likely lung primary cancer, and possible aspiration pneumonia - appreciate pulmonology and oncology assistance -  S/p LEFT thoracentesis with improvement after removal of 1.5L of fluid -  continue lasix at current dose -  Bone scan negative for bony mets -  Repeat chest x-ray shows some extensive patchy consolidation throughout the left lung, and some mild patchy opacity in the mid to lower right lung. -   Continue unasyn till discharge  - could not tolerate radiation traetments due to not tolerate positioning for treatment due to pain in his left chest from his disease and effusion - Awaiting PleurX catheter for recurrent left pleural effusion  Coronary artery disease, severe 3V disease, not a candidate for CABG due to COPD  Chest pain free -  Had DES placed to the RCA on 05/17/2016 -  Cardiology recommends NOT suspending aspirin or plavix at this time due to high risk of in-stent thrombosis -  Continue BB -  Not on statin due to age and frailty -  Continue ranolazine and imdur  Chronic diastolic and systolic heart failure ([place]), EF of 30-35 percent - Pt on isordil and B blocker - no ACEI/ARB or spironolactone due to CKD - diuretics as above  Paroxismal atrial fibrillation.  -  CHADs2vasc = 5    -  No anticoagulation due to possible upcoming  procedures/nearing end of life -  Tele:  SR, okay to d/c telemetry  Aortic atherosclerosis with 5.4 cm x 3.9 cm AAA, continue aspirin.  No statin due to age and comorbidities  COPD (chronic obstructive pulmonary disease) (HCC)/ ILD (interstitial lung disease) (HCC) -  High perioperative risk, not a good surgical candidate  Essential hypertension - continue current antihypertensive regimen  CKD stage IV, creatinine  is 3.05                                  Healing rib fractures -  PT recommending SNF -  Continue morphine per palliative care  Anemia of chronic disease and renal disease -  No need for blood transfusion today.  DVT prophylaxis:  Heparin  Code Status:  DNR  Family Communication:  Discussed with wife at bedside  Disposition Plan:   Plan to go to residential hospice after [facility] catheter placement   Consultants:   Pulmonology  Procedures:  L thora 6/7:  1.5L removed  Antimicrobials:   Unasyn 6/16 >    Subjective: Patient has no new complaints. Looking forward to having procedure done today.  Objective: Filed Vitals:   Jul 19, 2016 0232 Jul 19, 2016 0506 07-19-2016 0730 Jul 19, 2016 1430  BP:  117/78  121/60  Pulse:  88 78 93  Temp:  98 F (36.7 C)  98.4 F (36.9 C)  TempSrc:  Oral  Oral  Resp:  '16 18 18  '$ Height:      Weight:  72.621 kg (160 lb 1.6 oz)    SpO2: 89% 83% 85% 85%    Intake/Output Summary (Last 24 hours) at 19-Jul-2016 1740 Last data filed at Jul 19, 2016 0507  Gross per 24 hour  Intake    815 ml  Output    180 ml  Net    635 ml   Filed Weights   06/17/16 0500 06/19/16 0517 Jul 19, 2016 0506  Weight: 76.204 kg (168 lb) 73.029 kg (161 lb) 72.621 kg (160 lb 1.6 oz)    Examination:  Physical Exam: Eyes: No icterus, extraocular muscles intact  Mouth: Oral mucosa is moist, no lesions on palate,  Neck: Supple, no deformities, masses, or tenderness Lungs: Decreased breath sounds bilaterally More on the left lung field when compared to right, no wheezes Heart: Regular rate and rhythm, S1 and S2 normal, no murmurs, rubs auscultated Abdomen: BS normoactive,soft,nondistended,non-tender to palpation,no organomegaly Extremities: No pretibial edema, no erythema, no cyanosis, no clubbing Neuro : Alert and oriented to time, place and person, No focal deficits Skin: No rashes seen on exam      Data Reviewed: I have personally reviewed following labs and imaging  studies  CBC:  Recent Labs Lab 06/15/16 0401 06/18/16 0357 Jul 19, 2016 0355  WBC 15.3* 14.1* 13.7*  NEUTROABS  --  11.8* 11.7*  HGB 9.0* 8.7* 8.7*  HCT 28.9* 26.8* 27.3*  MCV 94.1 95.4 96.5  PLT 369 428* 761*   Basic Metabolic Panel:  Recent Labs Lab 06/16/16 0355 06/17/16 0344 06/18/16 0357 06/19/16 0356 July 19, 2016 0355  NA 132* 134* 134* 136 136  K 5.3* 5.2* 4.5 4.8 4.6  CL 98* 99* 96* 99* 98*  CO2 '23 23 25 24 25  '$ GLUCOSE 154* 142* 171* 154* 183*  BUN 75* 82* 86* 98* 76*  CREATININE 2.92* 3.01* 3.02* 3.05* 3.35*  CALCIUM 8.7* 8.6* 8.5* 8.8* 8.8*   GFR: Estimated Creatinine Clearance: 16.9 mL/min (by C-G formula based on Cr of 3.35). Liver  Function Tests:  Recent Labs Lab 06/14/16 0917 06/18/16 0357  AST 40 43*  ALT 33 30  ALKPHOS 79 82  BILITOT 0.4 0.6  PROT 6.2* 6.1*  ALBUMIN 1.7* 1.7*   BNP (last 3 results)  Recent Labs  05/14/16 1012  PROBNP 1454.0*   HbA1C: No results for input(s): HGBA1C in the last 72 hours. CBG:  Recent Labs Lab 06/19/16 1219 06/19/16 1707 06/19/16 2100 07-17-2016 0813 07-17-2016 1308  GLUCAP 181* 149* 181* 193* 181*   Urine analysis:    Component Value Date/Time   COLORURINE STRAW* 05/15/2016 1927   APPEARANCEUR CLEAR 05/15/2016 1927   LABSPEC 1.016 05/15/2016 1927   PHURINE 6.5 05/15/2016 1927   GLUCOSEU 250* 05/15/2016 1927   HGBUR SMALL* 05/15/2016 1927   BILIRUBINUR NEGATIVE 05/15/2016 [place] NEGATIVE 05/15/2016 1927   PROTEINUR 100* 05/15/2016 1927   UROBILINOGEN 0.2 10/06/2009 0923   NITRITE NEGATIVE 05/15/2016 1927   LEUKOCYTESUR NEGATIVE 05/15/2016 1927   Sepsis Labs: '@LABRCNTIP'$ (procalcitonin:4,lacticidven:4)  ) No results found for this or any previous visit (from the past 240 hour(s)).    Radiology Studies: No results found.   Scheduled Meds: . ampicillin-sulbactam (UNASYN) IV  1.5 g Intravenous Q12H  . antiseptic oral rinse  15 mL Mouth Rinse Q4H  . bisacodyl  20 mg Rectal Once   . budesonide (PULMICORT) nebulizer solution  0.5 mg Nebulization BID  . carvedilol  3.125 mg Oral BID WC  .  ceFAZolin (ANCEF) IV  2 g Intravenous to XRAY  . clopidogrel  75 mg Oral Daily  . insulin aspart  0-9 Units Subcutaneous TID WC & HS  . isosorbide dinitrate  10 mg Oral BID  . lidocaine      . polyethylene glycol  17 g Oral BID  . ranolazine  500 mg Oral BID  . senna  2 tablet Oral QHS  . sodium chloride flush  3 mL Intravenous Q12H  . sodium chloride flush  3 mL Intravenous Q12H   Continuous Infusions: . dextrose 5 % and 0.45 % NaCl with KCl 20 mEq/L       LOS: 15 days    Time spent: 30 min   Velvet Bathe, MD [facility] Pager [phone]  If 7PM-7AM, please contact night-coverage [email or web address] Password [facility] 07/17/16, 5:40 PM

## 2016-06-30 NOTE — Progress Notes (Signed)
RN paged because she found pt expired. Pt was DNR. Pt has been here for about 15 days with pleural effusion s/p thoracentesis, pulmonary edema, CHF decompensation, acute on chronic respiratory failure, AKI on CKD, among other chronic health issues. Also, lung mass of questionable diagnosis thought to be malignant.Today, pt had returned from thoracentesis on a NRB and was satting in the 21s. Pt intolerable of mask and requested to go on high flow O2 which RT applied. RN returned to check on pt a few minutes later, and he had passed away. Pronounced by 2 RNs. No family at bedside. Pt was slated for [facility] soon.  Death certificate completed and given to unit secretary. RN called family. KJKG, NP [facility]

## 2016-06-30 NOTE — Progress Notes (Signed)
Pt was comfortable on high flow oxygen via vasal canula. He wasn't able to tolerate the venti mask. o2 sats were at 82% on high flow.  Pt was able to drink some ensure and was resting quietly. Pt stated that is was okay for the family to leave.

## 2016-06-30 NOTE — Progress Notes (Signed)
This man has decided to not pursue any radiation therapy. I certainly cannot argue against this. His overall performance status is ECOG 4. I this point, our goal is comfort care.  A Pleurx catheter certainly would not be a bad idea. I really cannot hear much in way of breath sounds over on the left lung. A chest x-ray done 2 days ago showed that there was some moderate effusion and possible pneumonia. It is no surprise that he has postobstructive pneumonia.  Our goal, again, is Comfort Care. I would make sure that everything is utilized to help with discomfort. I would utilize morphine nebulizers. He is on Xopenex nebulizers. I think morphine may not be a bad idea.  I really don't think he needs subcutaneous heparin.  Getting him to be can place would be fantastic. There is absent no way he should go to a nursing home. There is no way that he can go home. His wife cannot take care of him at home.  He is very comfortable with his decision. He has a very strong faith. We had a very good prayer sessions morning. He is at peace with his decision. I certainly agree with all of this.  Again, we have to make sure that we make comfort our primary goal.  I'm not sure when he will get over to be can place. I will call them today to CVA bed is available. He needs to get the Pleurx catheter in first.  I just wish we could do more for him. He just came in and very tough shape. I really agree with transition over to total Comfort Care.  Lum Keas  2 Timothy 4:16-18

## 2016-06-30 NOTE — Progress Notes (Signed)
Walked into the room to assess and check on pt, and noted that the patient was not breathing. Skin was warm and dry. No breath sounds, no chest rise and fall, no pulse, no apical heart beat noted. Called in second nurse to verify that pt did not have pulse. Neil Crouch, Rn verified with me. Md Tylene Fantasia notified. Called and notified spouse Gurveer Colucci of pt's passing. Expiration time noted at 2099-03-26.  Death report completed.  CDS called. Spoke with Tonye Becket. Referral number: [id number]. Not suitable for eyes,organs, or tissues. Post mortem care completed. IV discontinued. Condom cath removed. Pt suitable for family viewing.

## 2016-06-30 NOTE — Progress Notes (Signed)
Pt has arrived back from thorencentesis procedure. Currently has on a non-rebreather mask. Pt is not tolerating the mask well and wants to go back on the high flow oxygen. Respiratory has been paged to come assess patient. Family is at the bedside.

## 2016-06-30 NOTE — Progress Notes (Signed)
Pt refused breathing treatment and refused CPAP qhs.  RT will continue to monitor as needed.

## 2016-06-30 NOTE — Procedures (Signed)
Interventional Radiology Procedure Note  Procedure:  Left thoracentesis  Complications:  None  Estimated Blood Loss: < 10 mL  Findings:  Patient could not sit up completely.  Left side rolled up in bed and thoracentesis able to be performed.  Insertion of 6 Fr centesis catheter yielded 500 mL of bloody fluid.  Effusion may be loculated.  Venetia Night. Kathlene Cote, M.D Pager:  [phone]

## 2016-06-30 DEATH — deceased

## 2016-07-02 ENCOUNTER — Ambulatory Visit: Payer: Medicare Other

## 2016-07-02 ENCOUNTER — Ambulatory Visit: Payer: Medicare Other | Admitting: Adult Health

## 2016-07-12 NOTE — Progress Notes (Incomplete)
°  Radiation Oncology         [phone]) [phone] ________________________________  Name: Alan Ford MRN: [id number]  Date: 06/25/2016  DOB: Oct 14, 1930  End of Treatment Note  Diagnosis:   Bronchogenic cancer of left lung     Indication for treatment:  Curative       Radiation treatment dates:   None  Site/dose:   Planned to treat the Left lung to 30 Gy in 10 fractions at 3 Gy per fraction.   Beams/energy:   3D // 10X, 6X  Narrative: The patient did not proceed with radiation treatment.   Plan: The patient could not tolerate positioning for treatment due to pain in his left chest from his disease and effusion. He and his wife have elected to proceed with hospice care rather than radiotherapy at this time.  ------------------------------------------------  Jodelle Gross, MD, PhD  This document serves as a record of services personally performed by Kyung Rudd, MD. It was created on his behalf by Arlyce Harman, a trained medical scribe. The creation of this record is based on the scribe's personal observations and the provider's statements to them. This document has been checked and approved by the attending provider.

## 2016-07-16 ENCOUNTER — Ambulatory Visit: Payer: Medicare Other | Admitting: Emergency Medicine

## 2016-07-21 NOTE — Addendum Note (Signed)
Encounter addended by: Kyung Rudd, MD on: 07/21/2016  8:51 PM<BR>     Documentation filed: Notes Section, Visit Diagnoses, Problem List

## 2016-07-21 NOTE — Progress Notes (Signed)
  Radiation Oncology         [phone] ________________________________  Name: Alan Ford MRN: [id number]  Date: 06/15/2016  DOB: October 23, 1930  SIMULATION AND TREATMENT PLANNING NOTE  DIAGNOSIS:     ICD-9-CM ICD-10-CM   1. Metastatic lung carcinoma, left (HCC) 197.0 C78.02      Site:  Left lung  NARRATIVE:  The patient was brought to the [facility].  Identity was confirmed.  All relevant records and images related to the planned course of therapy were reviewed.   Written consent to proceed with treatment was confirmed which was freely given after reviewing the details related to the planned course of therapy had been reviewed with the patient.  Then, the patient was set-up in a stable reproducible  supine position for radiation therapy.  CT images were obtained.  Surface markings were placed.     The CT images were loaded into the planning software.  Then the target and avoidance structures were contoured.  Treatment planning then occurred.  The radiation prescription was entered and confirmed.  A total of 4 complex treatment devices were fabricated which relate to the designed radiation treatment fields. Each of these customized fields/ complex treatment devices will be used on a daily basis during the radiation course. I have requested : 3D Simulation  I have requested a DVH of the following structures: Target volume, lungs, esophagus, spinal cord.   The patient will undergo daily image guidance to ensure accurate localization of the target, and adequate minimize dose to the normal surrounding structures in close proximity to the target.   PLAN:  The patient will receive 30 Gy in 10 fractions.  ________________________________   Jodelle Gross, MD, PhD

## 2016-07-31 NOTE — Discharge Summary (Signed)
Death Summary  DAMARRI RAMPY XIH:[id number] DOB: 1930-01-24 DOA: 13-Jun-2016  PCP: Mathews Argyle, MD  Admit date: 2016-06-13 Date of Death: 15-Jul-2016  Final Diagnoses:  Principal Problem:   Acute on chronic respiratory failure with hypoxia (HCC) Active Problems:   Chronic diastolic heart failure (HCC)   Dyspnea   COPD (chronic obstructive pulmonary disease) (HCC)   ILD (interstitial lung disease) (HCC)   Essential hypertension   CHF (congestive heart failure) (HCC)   Coronary artery disease involving native coronary artery of native heart without angina pectoris   Lung mass   Pleural effusion on left   Acute on chronic renal failure (HCC)   Lymphadenopathy   DNR (do not resuscitate)   Palliative care encounter   S/P thoracentesis   Metastatic lung carcinoma [facility])  RN paged because she found pt expired. Pt was DNR. Pt has been here for about 15 days with pleural effusion s/p thoracentesis, pulmonary edema, CHF decompensation, acute on chronic respiratory failure, AKI on CKD, among other chronic health issues. Also, lung mass of questionable diagnosis thought to be malignant.Today, pt had returned from thoracentesis on a NRB and was satting in the 66s. Pt intolerable of mask and requested to go on high flow O2 which RT applied. RN returned to check on pt a few minutes later, and he had passed away. Pronounced by 2 RNs. No family at bedside. Pt was slated for [facility] soon.  Death certificate completed and given to unit secretary. RN called family. Patrica Duel, NP [facility]   Velvet Bathe  [facility] 2016/07/15, 1:18 PM

## 2016-08-03 ENCOUNTER — Ambulatory Visit: Payer: Medicare Other | Admitting: Cardiovascular Disease

## 2016-08-15 ENCOUNTER — Other Ambulatory Visit: Payer: Medicare Other

## 2016-08-20 ENCOUNTER — Ambulatory Visit: Payer: Medicare Other | Admitting: Emergency Medicine

## 2016-09-13 ENCOUNTER — Other Ambulatory Visit: Payer: Medicare Other

## 2016-09-13 ENCOUNTER — Ambulatory Visit: Payer: Medicare Other | Admitting: Hematology

## 2016-12-11 ENCOUNTER — Other Ambulatory Visit (HOSPITAL_COMMUNITY): Payer: Medicare Other

## 2016-12-11 ENCOUNTER — Ambulatory Visit: Payer: Medicare Other | Admitting: Family

## 2018-03-02 IMAGING — CT CT CHEST W/O CM
2 of 3 series · 14 of 36 positions shown, 17 images · IV contrast (agent unspecified)
Comparison: Chest radiographs 05/14/2016 and earlier.
High-resolution Chest CT without contrast 06/08/2015. PET-CT
09/27/2015

ADDENDUM:
Study discussed by telephone with Dr. Ourari on 05/16/2016 at 1578
hours. We discussed follow-up CT chest with IV contrast, and failing
that - bronchoscopy.
CLINICAL DATA: 86-year-old male with severe coronary artery
disease, left ventricular systolic dysfunction with several week
progressive symptoms of chest pain, shortness of Breath and combined
systolic/diastolic congestive heart failure. Initial encounter.

EXAM:
CT CHEST WITHOUT CONTRAST
TECHNIQUE: Multidetector CT imaging of the chest was performed following the
standard protocol without IV contrast.

[Series 2: chest w/o 2mm st · axial · non-contrast · 0.77mm/px · z∈[-291,-9]mm · 11 of 167 slices shown, 14 images]
[im 13/167  mediastinal]
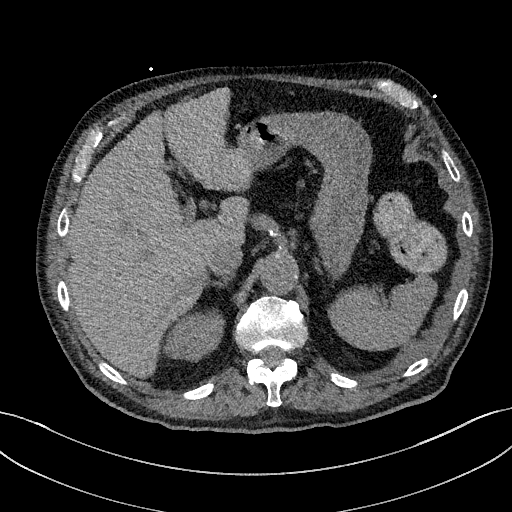
[im 13/167  lung]
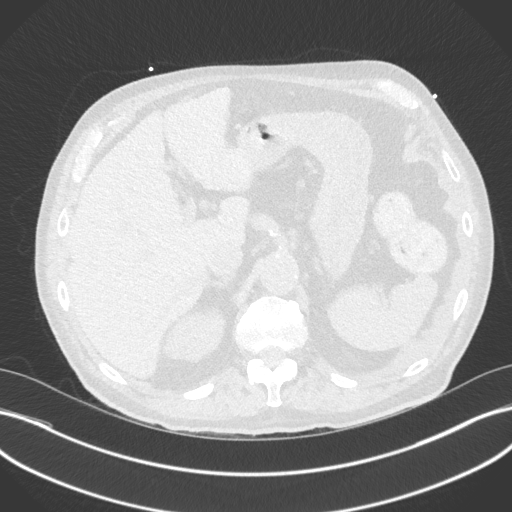
[im 25/167  lung]
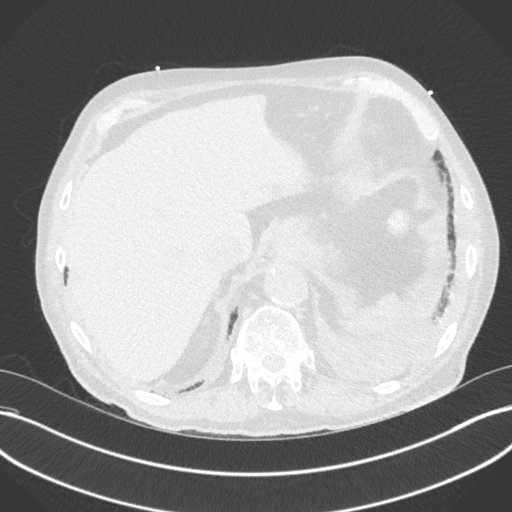
[im 37/167  lung]
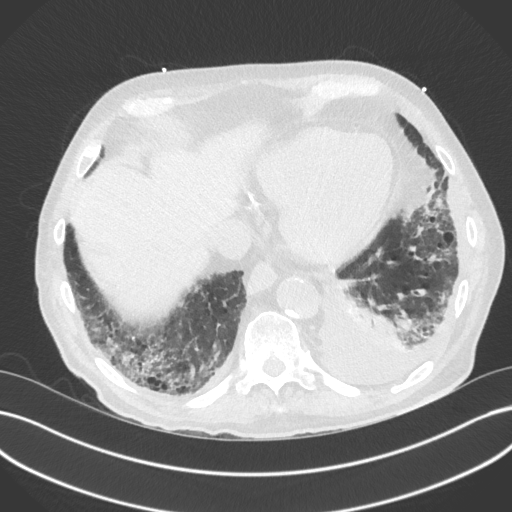
[im 56/167  lung]
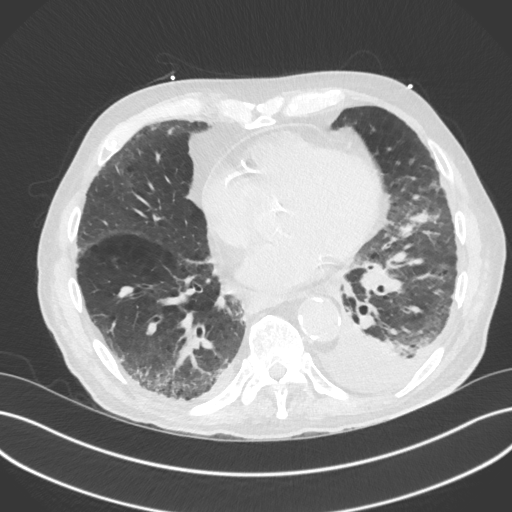
[im 68/167  mediastinal]
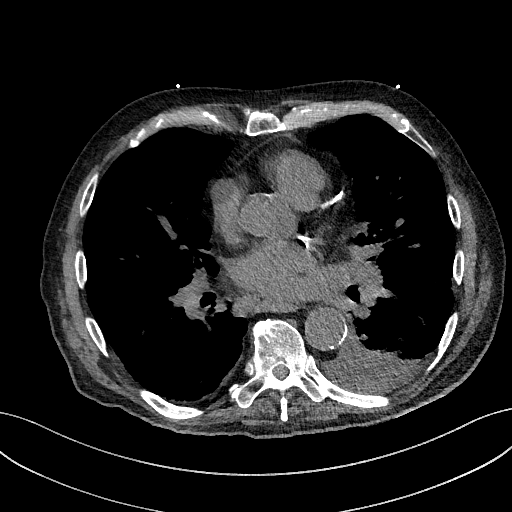
[im 68/167  lung]
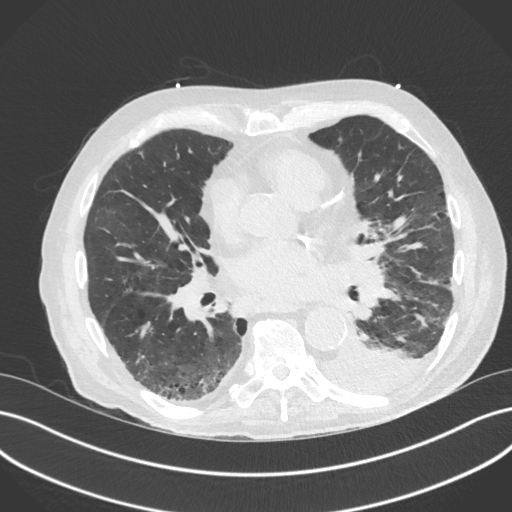
[im 87/167  lung]
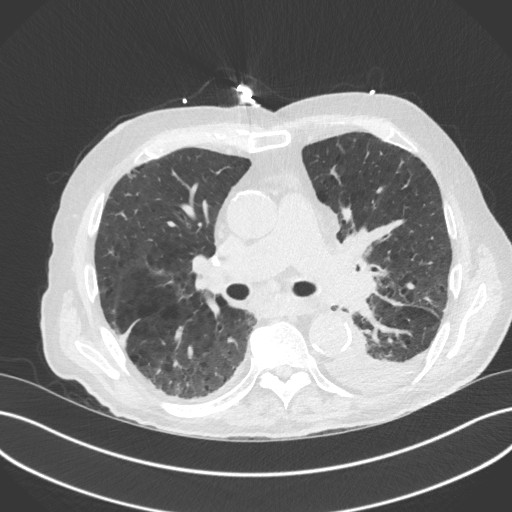
[im 99/167  lung]
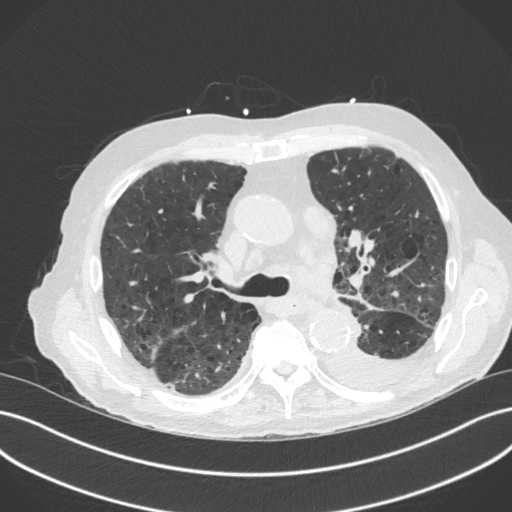
[im 111/167  lung]
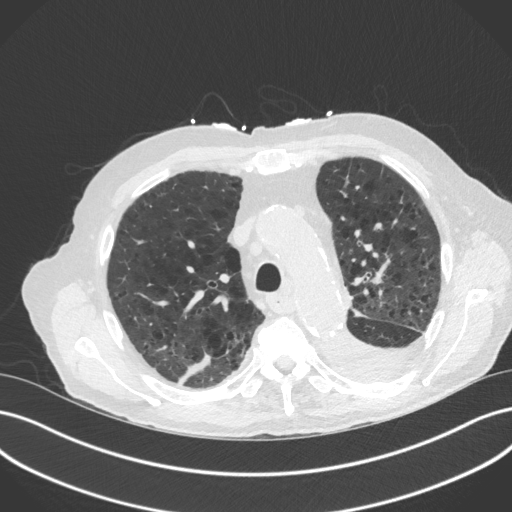
[im 130/167  mediastinal]
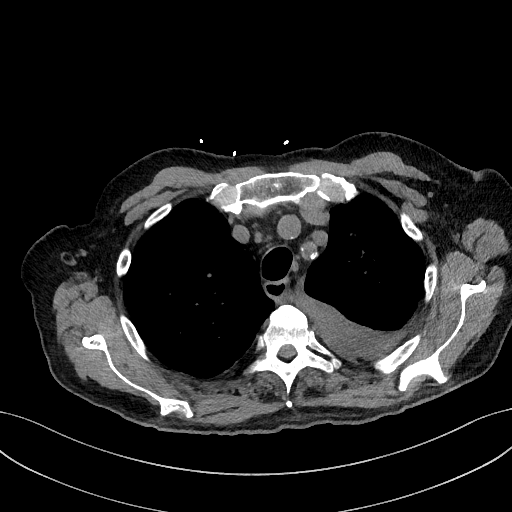
[im 130/167  lung]
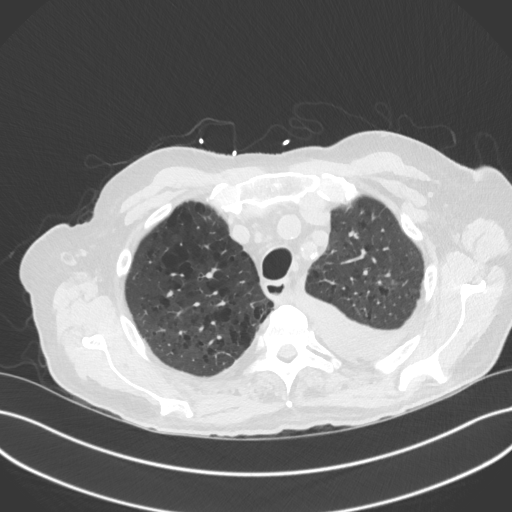
[im 142/167  lung]
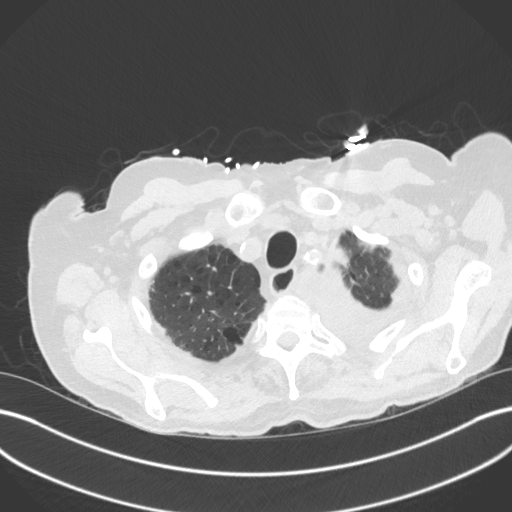
[im 154/167  lung]
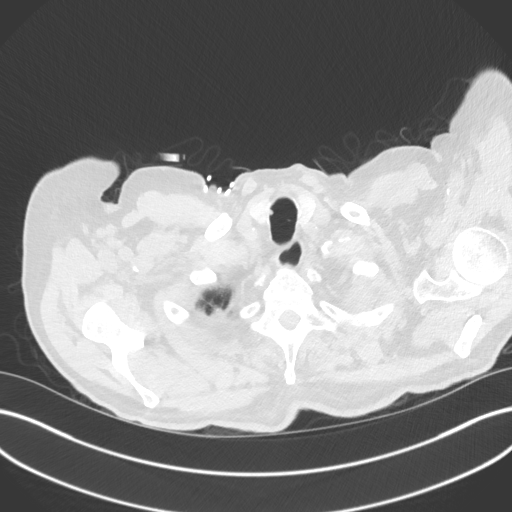

[Series 4: chest w/o 3mm st cor · coronal · non-contrast · 0.70mm/px · 3 of 92 slices shown]
[im 19/92  lung]
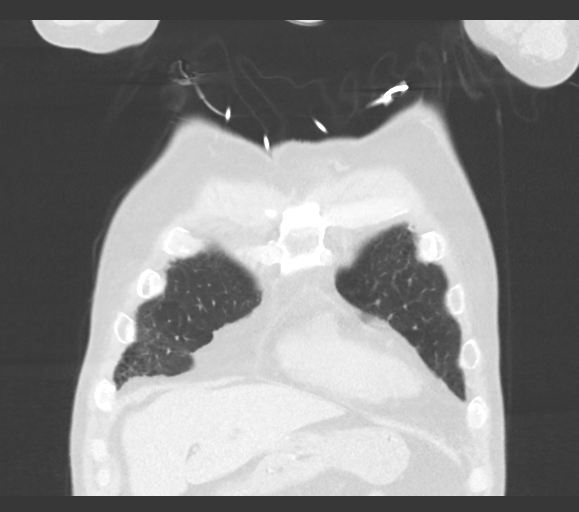
[im 37/92  lung]
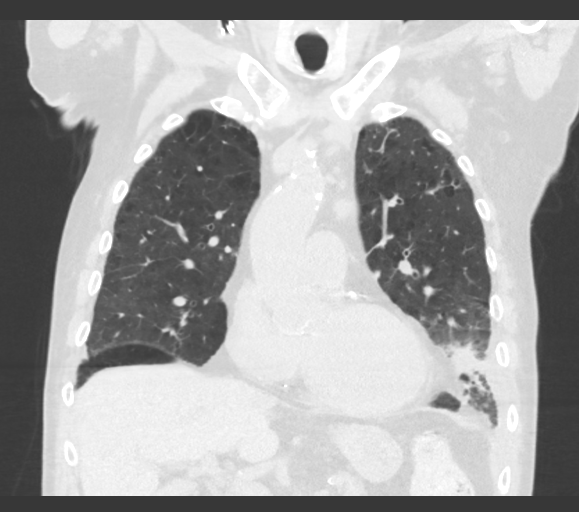
[im 55/92  lung]
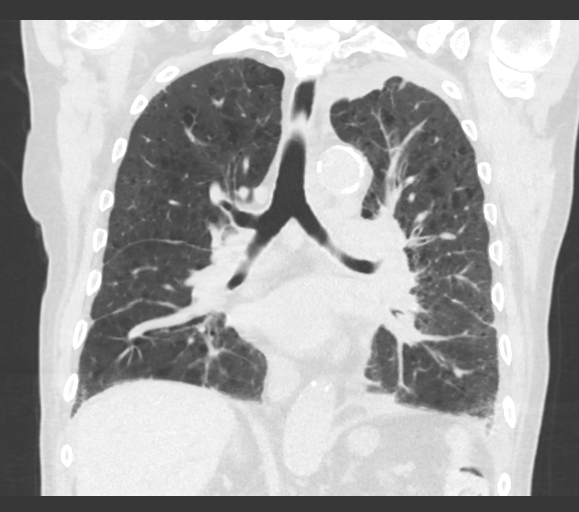

[14 of 36 positions shown; findings below may reference images not displayed]

FINDINGS: Chronic centrilobular emphysema with chronic bibasilar fibrotic
changes. Superimposed moderate size layering left pleural effusion
(layering to 2-2.5 cm from the lung base to the apex). Superimposed
compressive atelectasis along the effusion.

However, there is also increased left hilar soft tissue density
which is exerting mass effect on the left lingula and lower lobe
bronchi (series 3, image 93). This appears to be new since June 2015
and new or increased since September 2015. On soft tissue windows
through this area some of the density appears to be hilar
vasculature, however, there is definite significant increased in
left prevascular and AP window lymph nodes now measuring 15-20 mm
short axis individually (9 mm or less in June 2015).

There is a superimposed 4 cm lobulated/spiculated opacity in the
lingula (series 3, image 118) which is new from prior studies. There
is associated adjacent left chest wall pleural thickening (series 2,
image 124).

Elsewhere there is increased curvilinear opacity along the right
major fissure which most resembles atelectasis. There is chronic
right apical scarring which is stable. Chronic right middle lobe
calcified granuloma.

No pericardial effusion. No right pleural effusion. Right
peritracheal, right precarinal, and right hilar lymph nodes appear
stable since 8016 (including occasional small right hilar calcified
nodes.

There is severe calcified atherosclerosis of the thoracic aorta.
Severe coronary artery calcified plaque also noted.

Negative thoracic inlet. No axillary lymphadenopathy. Stable
visualized upper abdominal viscera including both adrenal glands.

Chronic posterior left third through seventh rib fractures. No acute
or suspicious osseous lesion identified.
IMPRESSION: 1. Findings on this noncontrast exam which are highly suspicious of
left lingula malignancy with malignant left hilar and prevascular/AP
window lymphadenopathy. A follow-up IV contrast enhanced CT chest
would be most valuable at this point for further evaluation.
2. Moderate layering left pleural effusion.
3. Underlying chronic lung disease including emphysema and basilar
pulmonary fibrosis.
4. Widespread/severe calcified atherosclerosis of the thoracic aorta
and coronary arteries.
